# Patient Record
Sex: Female | Born: 1935
Health system: Southern US, Community
[De-identification: ages and names within clinical notes are randomized; demographics above are authoritative.]

## PROBLEM LIST (undated history)

## (undated) DIAGNOSIS — I351 Nonrheumatic aortic (valve) insufficiency: Secondary | ICD-10-CM

## (undated) DIAGNOSIS — I779 Disorder of arteries and arterioles, unspecified: Secondary | ICD-10-CM

## (undated) DIAGNOSIS — I471 Supraventricular tachycardia, unspecified: Secondary | ICD-10-CM

## (undated) DIAGNOSIS — I10 Essential (primary) hypertension: Secondary | ICD-10-CM

## (undated) DIAGNOSIS — I447 Left bundle-branch block, unspecified: Secondary | ICD-10-CM

## (undated) DIAGNOSIS — I5189 Other ill-defined heart diseases: Secondary | ICD-10-CM

## (undated) DIAGNOSIS — I2510 Atherosclerotic heart disease of native coronary artery without angina pectoris: Secondary | ICD-10-CM

## (undated) DIAGNOSIS — E785 Hyperlipidemia, unspecified: Secondary | ICD-10-CM

## (undated) DIAGNOSIS — I428 Other cardiomyopathies: Secondary | ICD-10-CM

## (undated) HISTORY — DX: Essential (primary) hypertension: I10

## (undated) HISTORY — DX: Left bundle-branch block, unspecified: I44.7

## (undated) HISTORY — PX: CARDIAC CATHETERIZATION: SHX172

## (undated) HISTORY — DX: Nonrheumatic aortic (valve) insufficiency: I35.1

## (undated) HISTORY — DX: Other cardiomyopathies: I42.8

## (undated) HISTORY — DX: Disorder of arteries and arterioles, unspecified: I77.9

## (undated) HISTORY — PX: BREAST BIOPSY: SHX20

## (undated) HISTORY — DX: Hyperlipidemia, unspecified: E78.5

## (undated) HISTORY — DX: Atherosclerotic heart disease of native coronary artery without angina pectoris: I25.10

## (undated) HISTORY — PX: OTHER SURGICAL HISTORY: SHX169

## (undated) HISTORY — PX: ABDOMINAL HYSTERECTOMY: SHX81

## (undated) HISTORY — DX: Other ill-defined heart diseases: I51.89

## (undated) HISTORY — DX: Supraventricular tachycardia, unspecified: I47.10

---

## 2004-06-16 ENCOUNTER — Ambulatory Visit: Payer: Self-pay | Admitting: Unknown Physician Specialty

## 2005-03-08 ENCOUNTER — Ambulatory Visit: Payer: Self-pay | Admitting: Internal Medicine

## 2005-05-10 ENCOUNTER — Other Ambulatory Visit: Payer: Self-pay

## 2005-05-10 ENCOUNTER — Ambulatory Visit: Payer: Self-pay | Admitting: Podiatry

## 2005-05-12 ENCOUNTER — Ambulatory Visit: Payer: Self-pay | Admitting: Podiatry

## 2005-06-20 ENCOUNTER — Ambulatory Visit: Payer: Self-pay | Admitting: Unknown Physician Specialty

## 2006-07-24 ENCOUNTER — Ambulatory Visit: Payer: Self-pay | Admitting: Internal Medicine

## 2006-08-07 ENCOUNTER — Ambulatory Visit: Payer: Self-pay | Admitting: Internal Medicine

## 2006-08-22 ENCOUNTER — Ambulatory Visit: Payer: Self-pay | Admitting: Surgery

## 2006-08-29 ENCOUNTER — Ambulatory Visit: Payer: Self-pay | Admitting: Surgery

## 2007-10-17 ENCOUNTER — Ambulatory Visit: Payer: Self-pay | Admitting: Internal Medicine

## 2008-10-20 ENCOUNTER — Ambulatory Visit: Payer: Self-pay | Admitting: Internal Medicine

## 2009-02-09 ENCOUNTER — Emergency Department: Payer: Self-pay | Admitting: Unknown Physician Specialty

## 2009-10-27 ENCOUNTER — Ambulatory Visit: Payer: Self-pay | Admitting: Internal Medicine

## 2010-04-05 ENCOUNTER — Ambulatory Visit: Payer: Self-pay | Admitting: Internal Medicine

## 2010-11-01 ENCOUNTER — Ambulatory Visit: Payer: Self-pay | Admitting: Internal Medicine

## 2011-03-07 ENCOUNTER — Other Ambulatory Visit: Payer: Self-pay | Admitting: Internal Medicine

## 2011-03-08 MED ORDER — AMLODIPINE BESYLATE 5 MG PO TABS: 5.0000 mg | ORAL_TABLET | Freq: Every day | ORAL | Status: DC

## 2011-03-08 NOTE — Telephone Encounter (Signed)
Please ask this patient if she has fired me as her physician, because I heard a rumor that she had

## 2011-03-10 ENCOUNTER — Encounter: Payer: Self-pay | Admitting: Internal Medicine

## 2011-03-10 ENCOUNTER — Ambulatory Visit (INDEPENDENT_AMBULATORY_CARE_PROVIDER_SITE_OTHER): Payer: Medicare Other | Admitting: Internal Medicine

## 2011-03-10 VITALS — BP 116/75 | HR 73 | Temp 98.4°F | Resp 16 | Ht 62.0 in | Wt 125.5 lb

## 2011-03-10 DIAGNOSIS — Z23 Encounter for immunization: Secondary | ICD-10-CM

## 2011-03-10 DIAGNOSIS — I10 Essential (primary) hypertension: Secondary | ICD-10-CM

## 2011-03-10 DIAGNOSIS — Z0000 Encounter for general adult medical examination without abnormal findings: Secondary | ICD-10-CM

## 2011-03-10 DIAGNOSIS — E785 Hyperlipidemia, unspecified: Secondary | ICD-10-CM

## 2011-03-10 DIAGNOSIS — R5383 Other fatigue: Secondary | ICD-10-CM

## 2011-03-10 LAB — COMPREHENSIVE METABOLIC PANEL
AST: 32 U/L (ref 0–37)
Albumin: 3.9 g/dL (ref 3.5–5.2)
BUN: 19 mg/dL (ref 6–23)
Calcium: 9.5 mg/dL (ref 8.4–10.5)
Chloride: 102 mEq/L (ref 96–112)
Glucose, Bld: 94 mg/dL (ref 70–99)
Potassium: 3.9 mEq/L (ref 3.5–5.1)
Total Protein: 7.2 g/dL (ref 6.0–8.3)

## 2011-03-10 LAB — CBC WITH DIFFERENTIAL/PLATELET
Basophils Relative: 0.6 % (ref 0.0–3.0)
Eosinophils Absolute: 0.2 10*3/uL (ref 0.0–0.7)
Lymphocytes Relative: 29.4 % (ref 12.0–46.0)
MCHC: 33.2 g/dL (ref 30.0–36.0)
Neutrophils Relative %: 57.2 % (ref 43.0–77.0)
Platelets: 205 10*3/uL (ref 150.0–400.0)
RBC: 4.82 Mil/uL (ref 3.87–5.11)
WBC: 6 10*3/uL (ref 4.5–10.5)

## 2011-03-10 LAB — LDL CHOLESTEROL, DIRECT: Direct LDL: 203.7 mg/dL

## 2011-03-10 LAB — LIPID PANEL: Cholesterol: 299 mg/dL — ABNORMAL HIGH (ref 0–200)

## 2011-03-10 NOTE — Patient Instructions (Signed)
I recommend that you get a TDaP vaccine (tetanus ,  Whooping cough) at the [facility] Dept soon.  \Your flu shot was given today.  Please get an annual eye exam with [facility] to check for glaucoma.   Return in 6 months for a blood pressure check  We will call you with your lab results.    Your blood pressure is well controlled.  No changes to medications today

## 2011-03-10 NOTE — Progress Notes (Signed)
Subjective:    Patient ID: [name], female    DOB: Apr 30, 1923, 77 y.o.   MRN: [id number]  HPI    Review of Systems     Objective:   Physical Exam        Assessment & Plan:   Subjective:    [name] is a 77 y.o. female who presents for Medicare Annual/Subsequent preventive examination.  Preventive Screening-Counseling & Management  Tobacco History  Smoking status  . Never Smoker   Smokeless tobacco  . Never Used     Problems Prior to Visit 1.   Current Problems (verified) There is no problem list on file for this patient.   Medications Prior to Visit Current Outpatient Prescriptions on File Prior to Visit  Medication Sig Dispense Refill  . amLODipine (NORVASC) 5 MG tablet Take 1 tablet (5 mg total) by mouth daily.  30 tablet  3    Current Medications (verified) Current Outpatient Prescriptions  Medication Sig Dispense Refill  . amLODipine (NORVASC) 5 MG tablet Take 1 tablet (5 mg total) by mouth daily.  30 tablet  3  . aspirin 81 MG tablet Take 81 mg by mouth daily.        . Calcium Carbonate-Vitamin D (CALCIUM + D) 600-200 MG-UNIT TABS Take by mouth.        . captopril-hydrochlorothiazide (CAPOZIDE) 25-15 MG per tablet Take 1 tablet by mouth daily.        . fish oil-omega-3 fatty acids 1000 MG capsule Take by mouth daily.        . Multiple Vitamin (MULTIVITAMIN) tablet Take 1 tablet by mouth daily.        . niacin 500 MG tablet Take 500 mg by mouth daily with breakfast.           Allergies (verified) Contrast media and Sulfa antibiotics   PAST HISTORY  Family History No family history on file.  Social History History  Substance Use Topics  . Smoking status: Never Smoker   . Smokeless tobacco: Never Used  . Alcohol Use: No     Are there smokers in your home (other than you)? No  Risk Factors Current exercise habits: Home exercise routine includes walking 1/2 hrs per days.  Dietary issues discussed: none, healthy diet  Cardiac  risk factors: none.  Depression Screen (Note: if answer to either of the following is "Yes", a more complete depression screening is indicated)   Over the past two weeks, have you felt down, depressed or hopeless? No  Over the past two weeks, have you felt little interest or pleasure in doing things? No  Have you lost interest or pleasure in daily life? No  Do you often feel hopeless? No  Do you cry easily over simple problems? No  Activities of Daily Living In your present state of health, do you have any difficulty performing the following activities?:  Driving? No Managing money?  No Feeding yourself? No Getting from bed to chair? NoBP 116/75  Pulse 73  Temp(Src) 98.4 F (36.9 C) (Oral)  Resp 16  Ht 5\' 2"  (1.575 m)  Wt 125 lb 8 oz (56.926 kg)  BMI 22.95 kg/m2  SpO2 99%  General Appearance:    Alert, cooperative, no distress, appears stated age  Head:    Normocephalic, without obvious abnormality, atraumatic  Eyes:    PERRL, conjunctiva/corneas clear, EOM's intact, fundi    benign, both eyes  Ears:    Normal TM's and external ear canals, both ears  Nose:  Nares normal, septum midline, mucosa normal, no drainage    or sinus tenderness  Throat:   Lips, mucosa, and tongue normal; teeth and gums normal  Neck:   Supple, symmetrical, trachea midline, no adenopathy;    thyroid:  no enlargement/tenderness/nodules; no carotid   bruit or JVD  Back:     Symmetric, no curvature, ROM normal, no CVA tenderness  Lungs:     Clear to auscultation bilaterally, respirations unlabored  Chest Wall:    No tenderness or deformity   Heart:    Regular rate and rhythm, S1 and S2 normal, no murmur, rub   or gallop  Breast Exam:    No tenderness, masses, or nipple abnormality  Abdomen:     Soft, non-tender, bowel sounds active all four quadrants,    no masses, no organomegaly  Genitalia:    Normal female without lesion, discharge or tenderness  Rectal:    Normal tone, normal prostate, no masses or  tenderness;   guaiac negative stool  Extremities:   Extremities normal, atraumatic, no cyanosis or edema  Pulses:   2+ and symmetric all extremities  Skin:   Skin color, texture, turgor normal, no rashes or lesions  Lymph nodes:   Cervical, supraclavicular, and axillary nodes normal  Neurologic:   CNII-XII intact, normal strength, sensation and reflexes    throughout   Climbing a flight of stairs? No Preparing food and eating?: No Bathing or showering? No Getting dressed: No Getting to the toilet? No Using the toilet:No Moving around from place to place: No In the past year have you fallen or had a near fall?:No   Are you sexually active?  No  Do you have more than one partner?  No  Hearing Difficulties: No Do you often ask people to speak up or repeat themselves? No Do you experience ringing or noises in your ears? No Do you have difficulty understanding soft or whispered voices? No   Do you feel that you have a problem with memory? No  Do you often misplace items? No  Do you feel safe at home?  Yes  Cognitive Testing  Alert? Yes  Normal Appearance?Yes  Oriented to person? Yes  Place? Yes   Time? Yes  Recall of three objects?  Yes  Can perform simple calculations? Yes  Displays appropriate judgment?Yes  Can read the correct time from a watch face?Yes   Advanced Directives have been discussed with the patient? No  List the Names of Other Physician/Practitioners you currently use: 1.    Indicate any recent Medical Services you may have received from other than [facility] providers in the past year (date may be approximate).   There is no immunization history on file for this patient.  Screening Tests Health Maintenance  Topic Date Due  . Tetanus/tdap  10/30/1943  . Colonoscopy  10/30/1974  . Zostavax  10/29/1984  . Pneumococcal Polysaccharide Vaccine Age 4 And Over  10/29/1989  . Influenza Vaccine  03/20/2011    All answers were reviewed with the patient and necessary  referrals were made:  Duncan Dull, MD   03/10/2011   History reviewed: allergies, current medications, past family history, past medical history, past social history, past surgical history and problem list  Review of Systems Pertinent items are noted in HPI.    Objective:     Vision by Snellen chart: right eye:20/20, left eye:20/20  Body mass index is 22.95 kg/(m^2). BP 116/75  Pulse 73  Temp(Src) 98.4 F (36.9 C) (Oral)  Resp  16  Ht 5\' 2"  (1.575 m)  Wt 125 lb 8 oz (56.926 kg)  BMI 22.95 kg/m2  SpO2 99%  BP 116/75  Pulse 73  Temp(Src) 98.4 F (36.9 C) (Oral)  Resp 16  Ht 5\' 2"  (1.575 m)  Wt 125 lb 8 oz (56.926 kg)  BMI 22.95 kg/m2  SpO2 99% General appearance: alert, cooperative and no distress Head: Normocephalic, without obvious abnormality, atraumatic Eyes: conjunctivae/corneas clear. PERRL, EOM's intact. Fundi benign. Ears: normal TM's and external ear canals both ears Nose: Nares normal. Septum midline. Mucosa normal. No drainage or sinus tenderness. Throat: lips, mucosa, and tongue normal; teeth and gums normal Neck: no adenopathy, no carotid bruit, no JVD, supple, symmetrical, trachea midline and thyroid not enlarged, symmetric, no tenderness/mass/nodules Back: symmetric, no curvature. ROM normal. No CVA tenderness. Lungs: clear to auscultation bilaterally Breasts: normal appearance, no masses or tenderness Heart: regular rate and rhythm, S1, S2 normal, no murmur, click, rub or gallop Abdomen: soft, non-tender; bowel sounds normal; no masses,  no organomegaly Extremities: extremities normal, atraumatic, no cyanosis or edema Pulses: 2+ and symmetric Skin: Skin color, texture, turgor normal. No rashes or lesions Lymph nodes: Cervical, supraclavicular, and axillary nodes normal. Neurologic: Alert and oriented X 3, normal strength and tone. Normal symmetric reflexes. Normal coordination and gait Mental status: Alert, oriented, thought content appropriate Cranial  nerves: normal Sensory: normal Motor: grossly normal Reflexes: 2+ and symmetric Coordination: normal Gait: Normal     Assessment:     Healthy elderly female with grossly normal exam.      Plan:    Continue annual mammograms per patient request.  Discussed risk and benefits of continued colon cancer screenign with colonoscopy and will stop screening given her age. During the course of the visit the patient was educated and counseled about appropriate screening and preventive services including:    Pneumococcal vaccine   Influenza vaccine  Td vaccine  Screening mammography  Diet review for nutrition referral? Yes ____  Not Indicated ____   Patient Instructions (the written plan) was given to the patient.  Medicare Attestation I have personally reviewed: The patient's medical and social history Their use of alcohol, tobacco or illicit drugs Their current medications and supplements The patient's functional ability including ADLs,fall risks, home safety risks, cognitive, and hearing and visual impairment Diet and physical activities Evidence for depression or mood disorders  The patient's weight, height, BMI, and visual acuity have been recorded in the chart.  I have made referrals, counseling, and provided education to the patient based on review of the above and I have provided the patient with a written personalized care plan for preventive services.     Duncan Dull, MD   03/10/2011

## 2011-03-11 DIAGNOSIS — Z23 Encounter for immunization: Secondary | ICD-10-CM | POA: Insufficient documentation

## 2011-03-11 DIAGNOSIS — R5383 Other fatigue: Secondary | ICD-10-CM | POA: Insufficient documentation

## 2011-03-11 DIAGNOSIS — E782 Mixed hyperlipidemia: Secondary | ICD-10-CM | POA: Insufficient documentation

## 2011-03-11 DIAGNOSIS — I10 Essential (primary) hypertension: Secondary | ICD-10-CM | POA: Insufficient documentation

## 2011-03-11 NOTE — Assessment & Plan Note (Signed)
Likely due to age realted changes in exercise tolerance but will check thyroid , liver and kidney funtion and rule out anemia

## 2011-03-16 ENCOUNTER — Telehealth: Payer: Self-pay | Admitting: Internal Medicine

## 2011-03-16 NOTE — Telephone Encounter (Signed)
Pt would like her lab results  labs done Friday 03/10/11

## 2011-03-16 NOTE — Telephone Encounter (Signed)
Notified patient of results 

## 2011-03-20 HISTORY — PX: OTHER SURGICAL HISTORY: SHX169

## 2011-04-16 ENCOUNTER — Inpatient Hospital Stay: Payer: Self-pay | Admitting: Orthopedic Surgery

## 2011-04-17 DIAGNOSIS — S82843A Displaced bimalleolar fracture of unspecified lower leg, initial encounter for closed fracture: Secondary | ICD-10-CM

## 2011-04-17 DIAGNOSIS — R0902 Hypoxemia: Secondary | ICD-10-CM

## 2011-04-17 DIAGNOSIS — R9431 Abnormal electrocardiogram [ECG] [EKG]: Secondary | ICD-10-CM

## 2011-04-17 DIAGNOSIS — I10 Essential (primary) hypertension: Secondary | ICD-10-CM

## 2011-04-19 ENCOUNTER — Encounter: Payer: Self-pay | Admitting: Internal Medicine

## 2011-04-21 ENCOUNTER — Ambulatory Visit: Payer: Self-pay | Admitting: Internal Medicine

## 2011-04-22 ENCOUNTER — Encounter: Payer: Self-pay | Admitting: Internal Medicine

## 2011-04-24 ENCOUNTER — Other Ambulatory Visit: Payer: Self-pay | Admitting: Internal Medicine

## 2011-04-26 MED ORDER — CAPTOPRIL-HYDROCHLOROTHIAZIDE 25-15 MG PO TABS: 1.0000 | ORAL_TABLET | Freq: Two times a day (BID) | ORAL | Status: DC

## 2011-05-09 ENCOUNTER — Encounter: Payer: Self-pay | Admitting: Internal Medicine

## 2011-05-20 ENCOUNTER — Encounter: Payer: Self-pay | Admitting: Internal Medicine

## 2011-06-20 ENCOUNTER — Encounter: Payer: Self-pay | Admitting: Internal Medicine

## 2011-06-21 DIAGNOSIS — S82843A Displaced bimalleolar fracture of unspecified lower leg, initial encounter for closed fracture: Secondary | ICD-10-CM | POA: Diagnosis not present

## 2011-06-23 DIAGNOSIS — S82843A Displaced bimalleolar fracture of unspecified lower leg, initial encounter for closed fracture: Secondary | ICD-10-CM | POA: Diagnosis not present

## 2011-06-26 DIAGNOSIS — S82843A Displaced bimalleolar fracture of unspecified lower leg, initial encounter for closed fracture: Secondary | ICD-10-CM | POA: Diagnosis not present

## 2011-06-28 DIAGNOSIS — S82843A Displaced bimalleolar fracture of unspecified lower leg, initial encounter for closed fracture: Secondary | ICD-10-CM | POA: Diagnosis not present

## 2011-07-03 DIAGNOSIS — S82843A Displaced bimalleolar fracture of unspecified lower leg, initial encounter for closed fracture: Secondary | ICD-10-CM | POA: Diagnosis not present

## 2011-07-05 DIAGNOSIS — S82843A Displaced bimalleolar fracture of unspecified lower leg, initial encounter for closed fracture: Secondary | ICD-10-CM | POA: Diagnosis not present

## 2011-07-10 DIAGNOSIS — S82843A Displaced bimalleolar fracture of unspecified lower leg, initial encounter for closed fracture: Secondary | ICD-10-CM | POA: Diagnosis not present

## 2011-07-12 DIAGNOSIS — S82843A Displaced bimalleolar fracture of unspecified lower leg, initial encounter for closed fracture: Secondary | ICD-10-CM | POA: Diagnosis not present

## 2011-07-17 ENCOUNTER — Other Ambulatory Visit: Payer: Self-pay | Admitting: *Deleted

## 2011-07-17 DIAGNOSIS — S82843A Displaced bimalleolar fracture of unspecified lower leg, initial encounter for closed fracture: Secondary | ICD-10-CM | POA: Diagnosis not present

## 2011-07-17 MED ORDER — AMLODIPINE BESYLATE 5 MG PO TABS: 5.0000 mg | ORAL_TABLET | Freq: Every day | ORAL | Status: DC

## 2011-07-19 DIAGNOSIS — S82843A Displaced bimalleolar fracture of unspecified lower leg, initial encounter for closed fracture: Secondary | ICD-10-CM | POA: Diagnosis not present

## 2011-07-21 ENCOUNTER — Encounter: Payer: Self-pay | Admitting: Internal Medicine

## 2011-07-25 DIAGNOSIS — S82843A Displaced bimalleolar fracture of unspecified lower leg, initial encounter for closed fracture: Secondary | ICD-10-CM | POA: Diagnosis not present

## 2011-07-28 DIAGNOSIS — S82843A Displaced bimalleolar fracture of unspecified lower leg, initial encounter for closed fracture: Secondary | ICD-10-CM | POA: Diagnosis not present

## 2011-07-31 DIAGNOSIS — S82843A Displaced bimalleolar fracture of unspecified lower leg, initial encounter for closed fracture: Secondary | ICD-10-CM | POA: Diagnosis not present

## 2011-08-02 DIAGNOSIS — S82843A Displaced bimalleolar fracture of unspecified lower leg, initial encounter for closed fracture: Secondary | ICD-10-CM | POA: Diagnosis not present

## 2011-08-07 DIAGNOSIS — S82843A Displaced bimalleolar fracture of unspecified lower leg, initial encounter for closed fracture: Secondary | ICD-10-CM | POA: Diagnosis not present

## 2011-08-09 DIAGNOSIS — S82843A Displaced bimalleolar fracture of unspecified lower leg, initial encounter for closed fracture: Secondary | ICD-10-CM | POA: Diagnosis not present

## 2011-08-14 DIAGNOSIS — S82843A Displaced bimalleolar fracture of unspecified lower leg, initial encounter for closed fracture: Secondary | ICD-10-CM | POA: Diagnosis not present

## 2011-08-16 DIAGNOSIS — S82843A Displaced bimalleolar fracture of unspecified lower leg, initial encounter for closed fracture: Secondary | ICD-10-CM | POA: Diagnosis not present

## 2011-08-21 DIAGNOSIS — S82843A Displaced bimalleolar fracture of unspecified lower leg, initial encounter for closed fracture: Secondary | ICD-10-CM | POA: Diagnosis not present

## 2011-08-23 DIAGNOSIS — S82843A Displaced bimalleolar fracture of unspecified lower leg, initial encounter for closed fracture: Secondary | ICD-10-CM | POA: Diagnosis not present

## 2011-08-28 DIAGNOSIS — S82843A Displaced bimalleolar fracture of unspecified lower leg, initial encounter for closed fracture: Secondary | ICD-10-CM | POA: Diagnosis not present

## 2011-08-30 DIAGNOSIS — S82843A Displaced bimalleolar fracture of unspecified lower leg, initial encounter for closed fracture: Secondary | ICD-10-CM | POA: Diagnosis not present

## 2011-09-04 DIAGNOSIS — S82843A Displaced bimalleolar fracture of unspecified lower leg, initial encounter for closed fracture: Secondary | ICD-10-CM | POA: Diagnosis not present

## 2011-09-06 DIAGNOSIS — S82843A Displaced bimalleolar fracture of unspecified lower leg, initial encounter for closed fracture: Secondary | ICD-10-CM | POA: Diagnosis not present

## 2011-09-08 ENCOUNTER — Other Ambulatory Visit: Payer: Self-pay | Admitting: Internal Medicine

## 2011-09-08 MED ORDER — CAPTOPRIL-HYDROCHLOROTHIAZIDE 25-15 MG PO TABS: 1.0000 | ORAL_TABLET | Freq: Two times a day (BID) | ORAL | Status: DC

## 2011-09-11 ENCOUNTER — Ambulatory Visit (INDEPENDENT_AMBULATORY_CARE_PROVIDER_SITE_OTHER): Payer: Medicare Other | Admitting: Internal Medicine

## 2011-09-11 ENCOUNTER — Encounter: Payer: Self-pay | Admitting: Internal Medicine

## 2011-09-11 DIAGNOSIS — I10 Essential (primary) hypertension: Secondary | ICD-10-CM | POA: Diagnosis not present

## 2011-09-11 DIAGNOSIS — R5381 Other malaise: Secondary | ICD-10-CM

## 2011-09-11 DIAGNOSIS — E559 Vitamin D deficiency, unspecified: Secondary | ICD-10-CM | POA: Diagnosis not present

## 2011-09-11 DIAGNOSIS — Z1239 Encounter for other screening for malignant neoplasm of breast: Secondary | ICD-10-CM | POA: Diagnosis not present

## 2011-09-11 DIAGNOSIS — Z79899 Other long term (current) drug therapy: Secondary | ICD-10-CM

## 2011-09-11 DIAGNOSIS — R5383 Other fatigue: Secondary | ICD-10-CM | POA: Diagnosis not present

## 2011-09-11 DIAGNOSIS — E785 Hyperlipidemia, unspecified: Secondary | ICD-10-CM | POA: Diagnosis not present

## 2011-09-11 DIAGNOSIS — IMO0002 Reserved for concepts with insufficient information to code with codable children: Secondary | ICD-10-CM

## 2011-09-11 DIAGNOSIS — Z8781 Personal history of (healed) traumatic fracture: Secondary | ICD-10-CM

## 2011-09-11 DIAGNOSIS — S82843A Displaced bimalleolar fracture of unspecified lower leg, initial encounter for closed fracture: Secondary | ICD-10-CM | POA: Diagnosis not present

## 2011-09-11 DIAGNOSIS — Z298 Encounter for other specified prophylactic measures: Secondary | ICD-10-CM

## 2011-09-11 LAB — COMPLETE METABOLIC PANEL WITH GFR
Alkaline Phosphatase: 64 U/L (ref 39–117)
CO2: 27 mEq/L (ref 19–32)
Creat: 0.98 mg/dL (ref 0.50–1.10)
GFR, Est African American: 60 mL/min
GFR, Est Non African American: 52 mL/min — ABNORMAL LOW
Glucose, Bld: 90 mg/dL (ref 70–99)
Sodium: 139 mEq/L (ref 135–145)
Total Bilirubin: 0.8 mg/dL (ref 0.3–1.2)
Total Protein: 6.7 g/dL (ref 6.0–8.3)

## 2011-09-11 LAB — LIPID PANEL
Cholesterol: 235 mg/dL — ABNORMAL HIGH (ref 0–200)
HDL: 62 mg/dL (ref >39.00)
Triglycerides: 77 mg/dL (ref 0.0–149.0)

## 2011-09-11 LAB — LDL CHOLESTEROL, DIRECT: Direct LDL: 156.2 mg/dL

## 2011-09-11 MED ORDER — AMOXICILLIN 500 MG PO CAPS: ORAL_CAPSULE | ORAL | Status: DC

## 2011-09-11 NOTE — Progress Notes (Signed)
Patient ID: [name], female   DOB: 17-Oct-1922, 78 y.o.   MRN: [id number]   Patient Active Problem List  Diagnoses  . Fatigue  . Other and unspecified hyperlipidemia  . Need for prophylactic vaccination and inoculation against influenza  . Hypertension  . History of ankle fracture    Subjective:  CC:   Chief Complaint  Patient presents with  . Follow-up    HPI:   [name] a 78 y.o. female who presents for follow up on hypertension  She sustained an ankle fracture Oct 2012, secondary to jumping over a pile of leaves and tripping on the underlying curb.  She was hospitalized at [facility], underwent surgery by Dr.Krasinski, and required rehab prior to returning home.,  She remembers little of the 5 days she spent in the hospital .  Her last day of rehab/PT is today.  Her ankle still swells by end of the day,  Having trouble descending stairs,  Has compensated by descending sideways.  Having some trouble going back to sleep in the middle of the night ,  But does not want medication .   Past Medical History  Diagnosis Date  . Cancer     basal cell Ca,  treated y Dr. Adriana Simas with Mohs procedure    Past Surgical History  Procedure Date  . Left ankle Octo 2012    s/p pinning , Krazinski         The following portions of the patient's history were reviewed and updated as appropriate: Allergies, current medications, and problem list.    Review of Systems:   12 Pt  review of systems was negative except those addressed in the HPI,     History   Social History  . Marital Status: Married    Spouse Name: N/A    Number of Children: N/A  . Years of Education: N/A   Occupational History  . Not on file.   Social History Main Topics  . Smoking status: Never Smoker   . Smokeless tobacco: Never Used  . Alcohol Use: No  . Drug Use: No  . Sexually Active: Not on file   Other Topics Concern  . Not on file   Social History Narrative  . No narrative on file     Objective:  BP 124/62  Pulse 73  Temp(Src) 97.7 F (36.5 C) (Oral)  Resp 14  Ht 5\' 2"  (1.575 m)  Wt 125 lb 4 oz (56.813 kg)  BMI 22.91 kg/m2  SpO2 97%  General appearance: alert, cooperative and appears stated age Ears: normal TM's and external ear canals both ears Throat: lips, mucosa, and tongue normal; teeth and gums normal Neck: no adenopathy, no carotid bruit, supple, symmetrical, trachea midline and thyroid not enlarged, symmetric, no tenderness/mass/nodules Back: symmetric, no curvature. ROM normal. No CVA tenderness. Lungs: clear to auscultation bilaterally Heart: regular rate and rhythm, S1, S2 normal, no murmur, click, rub or gallop Abdomen: soft, non-tender; bowel sounds normal; no masses,  no organomegaly Pulses: 2+ and symmetric Skin: Skin color, texture, turgor normal. No rashes or lesions Lymph nodes: Cervical, supraclavicular, and axillary nodes normal.  Assessment and Plan:  Other and unspecified hyperlipidemia Managed with fish oil and niacin.  Repeat  Panel today is imporved, with drop in LDL by 50 pts to 156  Hypertension well controlled on current regimen,   renal function is stable.   History of ankle fracture S/p surgery. Now with ankle swelling .  Recommended use of compression stockings and elevation  of ankle.     Updated Medication List Outpatient Encounter Prescriptions as of 09/11/2011  Medication Sig Dispense Refill  . amLODipine (NORVASC) 5 MG tablet Take 1 tablet (5 mg total) by mouth daily.  30 tablet  6  . aspirin 81 MG tablet Take 81 mg by mouth daily.        . Calcium Carbonate-Vitamin D (CALCIUM + D) 600-200 MG-UNIT TABS Take by mouth.        . captopril-hydrochlorothiazide (CAPOZIDE) 25-15 MG per tablet Take 1 tablet by mouth 2 (two) times daily.  60 tablet  3  . fish oil-omega-3 fatty acids 1000 MG capsule Take by mouth daily.        . Multiple Vitamin (MULTIVITAMIN) tablet Take 1 tablet by mouth daily.        . niacin 500 MG  tablet Take 500 mg by mouth daily with breakfast.        . amoxicillin (AMOXIL) 500 MG capsule Take 4 tablets 1 hour before dental procedure  4 capsule  1     Orders Placed This Encounter  Procedures  . MM Digital Screening  . Vitamin D 25 hydroxy  . Lipid panel  . COMPLETE METABOLIC PANEL WITH GFR  . LDL cholesterol, direct    Return in about 6 months (around 03/13/2012).

## 2011-09-11 NOTE — Assessment & Plan Note (Signed)
S/p surgery. Now with ankle swelling .  Recommended use of compression stockings and elevation of ankle.

## 2011-09-11 NOTE — Assessment & Plan Note (Addendum)
Managed with fish oil and niacin.  Repeat  Panel today is imporved, with drop in LDL by 50 pts to 156

## 2011-09-11 NOTE — Assessment & Plan Note (Signed)
well controlled on current regimen,   renal function is stable.

## 2011-09-11 NOTE — Patient Instructions (Addendum)
If you would like to order a pair of compression stockings  A good [place] based company is [employer].  They have a website called [email or web address]  Call  them for a catalogue ,  You only need one for your left leg.    knee high,  Wide band,  Minimal pressure 8-15 mm hg    We will order your mammogram today

## 2011-11-07 ENCOUNTER — Ambulatory Visit: Payer: Self-pay | Admitting: Internal Medicine

## 2011-11-07 DIAGNOSIS — Z1231 Encounter for screening mammogram for malignant neoplasm of breast: Secondary | ICD-10-CM | POA: Diagnosis not present

## 2011-11-15 ENCOUNTER — Encounter: Payer: Self-pay | Admitting: Internal Medicine

## 2011-11-23 DIAGNOSIS — G43109 Migraine with aura, not intractable, without status migrainosus: Secondary | ICD-10-CM | POA: Diagnosis not present

## 2011-12-06 DIAGNOSIS — H43819 Vitreous degeneration, unspecified eye: Secondary | ICD-10-CM | POA: Diagnosis not present

## 2011-12-14 DIAGNOSIS — M898X9 Other specified disorders of bone, unspecified site: Secondary | ICD-10-CM | POA: Diagnosis not present

## 2011-12-14 DIAGNOSIS — M79609 Pain in unspecified limb: Secondary | ICD-10-CM | POA: Diagnosis not present

## 2011-12-14 DIAGNOSIS — G589 Mononeuropathy, unspecified: Secondary | ICD-10-CM | POA: Diagnosis not present

## 2011-12-25 DIAGNOSIS — L820 Inflamed seborrheic keratosis: Secondary | ICD-10-CM | POA: Diagnosis not present

## 2011-12-25 DIAGNOSIS — Z85828 Personal history of other malignant neoplasm of skin: Secondary | ICD-10-CM | POA: Diagnosis not present

## 2011-12-30 ENCOUNTER — Other Ambulatory Visit: Payer: Self-pay | Admitting: Internal Medicine

## 2012-02-08 ENCOUNTER — Telehealth: Payer: Self-pay | Admitting: Internal Medicine

## 2012-02-08 DIAGNOSIS — Z298 Encounter for other specified prophylactic measures: Secondary | ICD-10-CM

## 2012-02-08 MED ORDER — AMOXICILLIN 500 MG PO CAPS: ORAL_CAPSULE | ORAL | Status: DC

## 2012-02-08 NOTE — Telephone Encounter (Signed)
Patient called and stated she is having dental work coming up and needs an Rx for amoxicillin 500 mg.  Please advise.

## 2012-02-08 NOTE — Telephone Encounter (Signed)
[facility],  We did this in march as well , with a refill.

## 2012-02-09 ENCOUNTER — Other Ambulatory Visit: Payer: Self-pay | Admitting: Internal Medicine

## 2012-02-09 NOTE — Telephone Encounter (Signed)
Patient notified

## 2012-02-27 DIAGNOSIS — L819 Disorder of pigmentation, unspecified: Secondary | ICD-10-CM | POA: Diagnosis not present

## 2012-02-27 DIAGNOSIS — L578 Other skin changes due to chronic exposure to nonionizing radiation: Secondary | ICD-10-CM | POA: Diagnosis not present

## 2012-02-27 DIAGNOSIS — D1800 Hemangioma unspecified site: Secondary | ICD-10-CM | POA: Diagnosis not present

## 2012-02-27 DIAGNOSIS — L821 Other seborrheic keratosis: Secondary | ICD-10-CM | POA: Diagnosis not present

## 2012-02-27 DIAGNOSIS — Q825 Congenital non-neoplastic nevus: Secondary | ICD-10-CM | POA: Diagnosis not present

## 2012-02-27 DIAGNOSIS — Z85828 Personal history of other malignant neoplasm of skin: Secondary | ICD-10-CM | POA: Diagnosis not present

## 2012-03-13 ENCOUNTER — Ambulatory Visit (INDEPENDENT_AMBULATORY_CARE_PROVIDER_SITE_OTHER): Payer: Medicare Other | Admitting: Internal Medicine

## 2012-03-13 ENCOUNTER — Encounter: Payer: Self-pay | Admitting: Internal Medicine

## 2012-03-13 VITALS — BP 120/74 | HR 64 | Temp 98.1°F | Resp 16 | Ht 62.0 in | Wt 129.0 lb

## 2012-03-13 DIAGNOSIS — Z0000 Encounter for general adult medical examination without abnormal findings: Secondary | ICD-10-CM | POA: Diagnosis not present

## 2012-03-13 DIAGNOSIS — Z23 Encounter for immunization: Secondary | ICD-10-CM

## 2012-03-13 DIAGNOSIS — I10 Essential (primary) hypertension: Secondary | ICD-10-CM | POA: Diagnosis not present

## 2012-03-13 DIAGNOSIS — R609 Edema, unspecified: Secondary | ICD-10-CM | POA: Diagnosis not present

## 2012-03-13 DIAGNOSIS — Z79899 Other long term (current) drug therapy: Secondary | ICD-10-CM

## 2012-03-13 DIAGNOSIS — E785 Hyperlipidemia, unspecified: Secondary | ICD-10-CM

## 2012-03-13 DIAGNOSIS — M25473 Effusion, unspecified ankle: Secondary | ICD-10-CM | POA: Insufficient documentation

## 2012-03-13 LAB — COMPREHENSIVE METABOLIC PANEL
ALT: 16 U/L (ref 0–35)
AST: 24 U/L (ref 0–37)
Alkaline Phosphatase: 56 U/L (ref 39–117)
CO2: 31 mEq/L (ref 19–32)
Sodium: 138 mEq/L (ref 135–145)
Total Bilirubin: 0.8 mg/dL (ref 0.3–1.2)
Total Protein: 7.1 g/dL (ref 6.0–8.3)

## 2012-03-13 LAB — LDL CHOLESTEROL, DIRECT: Direct LDL: 182.9 mg/dL

## 2012-03-13 NOTE — Assessment & Plan Note (Signed)
She will return for fasting lipid panels when she is able.

## 2012-03-13 NOTE — Patient Instructions (Addendum)
You received the flu vaccine today  We will call you with the results of the labs

## 2012-03-13 NOTE — Progress Notes (Signed)
Patient ID: [name], female   DOB: [birth year], 78 y.o.   MRN: [id number]  The patient is here for annual Medicare wellness examination and management of other chronic and acute problems.  She continues to have right ankle swelling and brawny skin changes to the foot on the right side after her ankle fracture last fall. She does note that the ankle swelling improves with elevation of leg but recurs daily. She is requesting a repeat fasting lipid panel but did have breakfast this morning. She has no other complaints.    The risk factors are reflected in the social history.  The roster of all physicians providing medical care to patient - is listed in the Snapshot section of the chart.  Activities of daily living:  The patient is 100% independent in all ADLs: dressing, toileting, feeding as well as independent mobility  Home safety : The patient has smoke detectors in the home. They wear seatbelts.  There are no firearms at home. There is no violence in the home.   There is no risks for hepatitis, STDs or HIV. There is no   history of blood transfusion. They have no travel history to infectious disease endemic areas of the world.  The patient has seen their dentist in the last six month. They have seen their eye doctor in the last year. They admit to slight hearing difficulty with regard to whispered voices and some television programs.  They have deferred audiologic testing in the last year.  They do not  have excessive sun exposure. Discussed the need for sun protection: hats, long sleeves and use of sunscreen if there is significant sun exposure.   Diet: the importance of a healthy diet is discussed. They do have a healthy diet.  The benefits of regular aerobic exercise were discussed. She walks 4 times per week ,  20 minutes.   Depression screen: there are no signs or vegative symptoms of depression- irritability, change in appetite, anhedonia, sadness/tearfullness.  Cognitive assessment:  the patient manages all their financial and personal affairs and is actively engaged. They could relate day,date,year and events; recalled 2/3 objects at 3 minutes; performed clock-face test normally.  The following portions of the patient's history were reviewed and updated as appropriate: allergies, current medications, past family history, past medical history,  past surgical history, past social history  and problem list.  Visual acuity was not assessed per patient preference since she has regular follow up with her ophthalmologist. Hearing and body mass index were assessed and reviewed.   During the course of the visit the patient was educated and counseled about appropriate screening and preventive services including : fall prevention , diabetes screening, nutrition counseling, colorectal cancer screening, and recommended immunizations.    Objective  General appearance: alert, cooperative and appears stated age Ears: normal TM's and external ear canals both ears Throat: lips, mucosa, and tongue normal; teeth and gums normal Neck: no adenopathy, no carotid bruit, supple, symmetrical, trachea midline and thyroid not enlarged, symmetric, no tenderness/mass/nodules Back: symmetric, no curvature. ROM normal. No CVA tenderness. Lungs: clear to auscultation bilaterally Heart: regular rate and rhythm, S1, S2 normal, no murmur, click, rub or gallop Abdomen: soft, non-tender; bowel sounds normal; no masses,  no organomegaly Pulses: 2+ and symmetric Skin: Skin color, texture, turgor normal. No rashes or lesions Lymph nodes: Cervical, supraclavicular, and axillary nodes normal.  Assessment and Plan  Ankle edema Right-sided, recurrent, secondary to prior fracture with resulting venous insufficiency and brawny skin changes noted.  Patient is frustrated with the skin changes but does not want to wear a compression stocking. Recommended to elevate leg as often as possible.  Other and unspecified  hyperlipidemia She will return for fasting lipid panels when she is able.  Hypertension Well controlled on current regimen. Renal function stable, no changes today.   Updated Medication List Outpatient Encounter Prescriptions as of 03/13/2012  Medication Sig Dispense Refill  . amLODipine (NORVASC) 5 MG tablet TAKE 1 TABLET BY MOUTH DAILY.  30 tablet  6  . amoxicillin (AMOXIL) 500 MG capsule Take 4 tablets 1 hour before dental procedure  4 capsule  1  . aspirin 81 MG tablet Take 81 mg by mouth daily.        . Calcium Carbonate-Vitamin D (CALCIUM + D) 600-200 MG-UNIT TABS Take by mouth.        . captopril-hydrochlorothiazide (CAPOZIDE) 25-15 MG per tablet TAKE 1 TABLET BY MOUTH TWICE A DAY  60 tablet  3  . fish oil-omega-3 fatty acids 1000 MG capsule Take by mouth daily.        . Multiple Vitamin (MULTIVITAMIN) tablet Take 1 tablet by mouth daily.        . niacin 500 MG tablet Take 500 mg by mouth daily with breakfast.

## 2012-03-13 NOTE — Assessment & Plan Note (Signed)
Right-sided, recurrent, secondary to prior fracture with resulting venous insufficiency and brawny skin changes noted. Patient is frustrated with the skin changes but does not want to wear a compression stocking. Recommended to elevate leg as often as possible.

## 2012-03-13 NOTE — Assessment & Plan Note (Signed)
Well controlled on current regimen. Renal function stable, no changes today. 

## 2012-04-23 ENCOUNTER — Other Ambulatory Visit: Payer: Self-pay | Admitting: Internal Medicine

## 2012-07-01 ENCOUNTER — Other Ambulatory Visit: Payer: Self-pay | Admitting: Internal Medicine

## 2012-07-01 DIAGNOSIS — Z2989 Encounter for other specified prophylactic measures: Secondary | ICD-10-CM

## 2012-07-01 DIAGNOSIS — Z298 Encounter for other specified prophylactic measures: Secondary | ICD-10-CM

## 2012-07-01 MED ORDER — AMOXICILLIN 500 MG PO CAPS: ORAL_CAPSULE | ORAL | Status: DC

## 2012-07-01 NOTE — Telephone Encounter (Signed)
amoxicillin (AMOXIL) 500 MG capsule

## 2012-07-01 NOTE — Telephone Encounter (Signed)
Med filled.  

## 2012-07-01 NOTE — Telephone Encounter (Signed)
Refill for pre procedure amoxicillin authorized in epic and [facility]  [facility] pills

## 2012-07-01 NOTE — Telephone Encounter (Signed)
Pt is requesting an Amoxil refill. Ok to fill?

## 2012-08-31 DIAGNOSIS — S8010XA Contusion of unspecified lower leg, initial encounter: Secondary | ICD-10-CM | POA: Diagnosis not present

## 2012-09-11 ENCOUNTER — Encounter: Payer: Self-pay | Admitting: Internal Medicine

## 2012-09-11 ENCOUNTER — Ambulatory Visit (INDEPENDENT_AMBULATORY_CARE_PROVIDER_SITE_OTHER): Payer: Medicare Other | Admitting: Internal Medicine

## 2012-09-11 VITALS — BP 126/64 | HR 76 | Temp 97.8°F | Resp 16 | Wt 131.0 lb

## 2012-09-11 DIAGNOSIS — I10 Essential (primary) hypertension: Secondary | ICD-10-CM | POA: Diagnosis not present

## 2012-09-11 DIAGNOSIS — Z8781 Personal history of (healed) traumatic fracture: Secondary | ICD-10-CM | POA: Diagnosis not present

## 2012-09-11 DIAGNOSIS — R609 Edema, unspecified: Secondary | ICD-10-CM

## 2012-09-11 DIAGNOSIS — E785 Hyperlipidemia, unspecified: Secondary | ICD-10-CM

## 2012-09-11 DIAGNOSIS — Z79899 Other long term (current) drug therapy: Secondary | ICD-10-CM | POA: Diagnosis not present

## 2012-09-11 DIAGNOSIS — M25473 Effusion, unspecified ankle: Secondary | ICD-10-CM

## 2012-09-11 LAB — COMPREHENSIVE METABOLIC PANEL
ALT: 19 U/L (ref 0–35)
AST: 25 U/L (ref 0–37)
Alkaline Phosphatase: 56 U/L (ref 39–117)
BUN: 19 mg/dL (ref 6–23)
Chloride: 103 mEq/L (ref 96–112)
Creatinine, Ser: 0.8 mg/dL (ref 0.4–1.2)

## 2012-09-11 NOTE — Patient Instructions (Signed)
You are dong great!  Try elevating your wounded leg more and applying a warm washcloth to the "bump"  Return in 6 months for your annual wellness exam

## 2012-09-11 NOTE — Assessment & Plan Note (Signed)
Well controlled on current regimen. Renal function stable, no changes today. 

## 2012-09-11 NOTE — Progress Notes (Signed)
Patient ID: Keyleigh Manninen, female   DOB: [birth year], 79 y.o.   MRN: [id number]  Patient Active Problem List  Diagnosis  . Fatigue  . Other and unspecified hyperlipidemia  . Need for prophylactic vaccination and inoculation against influenza  . Hypertension  . History of ankle fracture  . Ankle edema    Subjective:  CC:   Chief Complaint  Patient presents with  . Follow-up    HPI:   Rosabel Sermeno a 79 y.o. female who presents for 6 month follow up on hypertension.  She feels generally well.  Has been taking her medications as directed and tolerating them well.  Occasional ankle pain and edema since her fracture. No falls.  Walking regularly.. Sleeping well.    Past Medical History  Diagnosis Date  . Cancer     basal cell Ca,  treated y Dr. Adriana Simas with Mohs procedure  . Hypertension   . Hyperlipidemia     Past Surgical History  Procedure Laterality Date  . Left ankle  Octo 2012    s/p pinning , Krazinski  . Abdominal hysterectomy    . Ankle      rods in both ankles       The following portions of the patient's history were reviewed and updated as appropriate: Allergies, current medications, and problem list.    Review of Systems:   Patient denies headache, fevers, malaise, unintentional weight loss, skin rash, eye pain, sinus congestion and sinus pain, sore throat, dysphagia,  hemoptysis , cough, dyspnea, wheezing, chest pain, palpitations, orthopnea, edema, abdominal pain, nausea, melena, diarrhea, constipation, flank pain, dysuria, hematuria, urinary  Frequency, nocturia, numbness, tingling, seizures,  Focal weakness, Loss of consciousness,  Tremor, insomnia, depression, anxiety, and suicidal ideation.      History   Social History  . Marital Status: Married    Spouse Name: N/A    Number of Children: N/A  . Years of Education: N/A   Occupational History  . Not on file.   Social History Main Topics  . Smoking status: Never Smoker   . Smokeless tobacco:  Never Used  . Alcohol Use: No  . Drug Use: No  . Sexually Active: Not on file   Other Topics Concern  . Not on file   Social History Narrative  . No narrative on file    Objective:  BP 126/64  Pulse 76  Temp(Src) 97.8 F (36.6 C) (Oral)  Resp 16  Wt 131 lb (59.421 kg)  BMI 23.95 kg/m2  General appearance: alert, cooperative and appears stated age Ears: normal TM's and external ear canals both ears Throat: lips, mucosa, and tongue normal; teeth and gums normal Neck: no adenopathy, no carotid bruit, supple, symmetrical, trachea midline and thyroid not enlarged, symmetric, no tenderness/mass/nodules Back: symmetric, no curvature. ROM normal. No CVA tenderness. Lungs: clear to auscultation bilaterally Heart: regular rate and rhythm, S1, S2 normal, no murmur, click, rub or gallop Abdomen: soft, non-tender; bowel sounds normal; no masses,  no organomegaly Pulses: 2+ and symmetric Skin: Skin color, texture, turgor normal. No rashes or lesions Lymph nodes: Cervical, supraclavicular, and axillary nodes normal.  Assessment and Plan:  Hypertension Well controlled on current regimen. Renal function stable, no changes today.  History of ankle fracture She has persistent mild pain in the ankle since her fracture. Managed with tylenol.   Other and unspecified hyperlipidemia Given her age and absence of other risk factors for CAD, no further testing recommended .  Ankle edema Asymmetric, secondary to ankle fracture,  Mild.    Updated Medication List Outpatient Encounter Prescriptions as of 09/11/2012  Medication Sig Dispense Refill  . amLODipine (NORVASC) 5 MG tablet TAKE 1 TABLET BY MOUTH DAILY.  30 tablet  6  . amoxicillin (AMOXIL) 500 MG capsule Take 4 tablets 1 hour before dental procedure  4 capsule  1  . aspirin 81 MG tablet Take 81 mg by mouth daily.        . Calcium Carbonate-Vitamin D (CALCIUM + D) 600-200 MG-UNIT TABS Take by mouth.        .  captopril-hydrochlorothiazide (CAPOZIDE) 25-15 MG per tablet TAKE 1 TABLET BY MOUTH TWICE A DAY  60 tablet  3  . fish oil-omega-3 fatty acids 1000 MG capsule Take by mouth daily.        . Multiple Vitamin (MULTIVITAMIN) tablet Take 1 tablet by mouth daily.        . niacin 500 MG tablet Take 500 mg by mouth daily with breakfast.         No facility-administered encounter medications on file as of 09/11/2012.     Orders Placed This Encounter  Procedures  . Comprehensive metabolic panel    No Follow-up on file.

## 2012-09-11 NOTE — Assessment & Plan Note (Signed)
Asymmetric, secondary to ankle fracture,  Mild.

## 2012-09-11 NOTE — Assessment & Plan Note (Signed)
Given her age and absence of other risk factors for CAD, no further testing recommended .

## 2012-09-11 NOTE — Assessment & Plan Note (Signed)
She has persistent mild pain in the ankle since her fracture. Managed with tylenol.

## 2012-09-14 ENCOUNTER — Observation Stay: Payer: Self-pay | Admitting: Internal Medicine

## 2012-09-14 DIAGNOSIS — I447 Left bundle-branch block, unspecified: Secondary | ICD-10-CM | POA: Diagnosis not present

## 2012-09-14 DIAGNOSIS — R0789 Other chest pain: Secondary | ICD-10-CM | POA: Diagnosis not present

## 2012-09-14 DIAGNOSIS — Z9889 Other specified postprocedural states: Secondary | ICD-10-CM | POA: Diagnosis not present

## 2012-09-14 DIAGNOSIS — E785 Hyperlipidemia, unspecified: Secondary | ICD-10-CM | POA: Diagnosis not present

## 2012-09-14 DIAGNOSIS — I10 Essential (primary) hypertension: Secondary | ICD-10-CM | POA: Diagnosis not present

## 2012-09-14 DIAGNOSIS — Z8249 Family history of ischemic heart disease and other diseases of the circulatory system: Secondary | ICD-10-CM | POA: Diagnosis not present

## 2012-09-14 LAB — CK TOTAL AND CKMB (NOT AT ARMC)
CK, Total: 72 U/L (ref 21–215)
CK-MB: 2.4 ng/mL (ref 0.5–3.6)

## 2012-09-14 LAB — BASIC METABOLIC PANEL
Anion Gap: 5 — ABNORMAL LOW (ref 7–16)
Chloride: 102 mmol/L (ref 98–107)
Potassium: 3.4 mmol/L — ABNORMAL LOW (ref 3.5–5.1)

## 2012-09-14 LAB — CBC
MCH: 31.6 pg (ref 26.0–34.0)
MCV: 93 fL (ref 80–100)
RDW: 13.3 % (ref 11.5–14.5)

## 2012-09-14 LAB — TROPONIN I
Troponin-I: <0.02 ng/mL
Troponin-I: <0.02 ng/mL

## 2012-09-15 DIAGNOSIS — I10 Essential (primary) hypertension: Secondary | ICD-10-CM | POA: Diagnosis not present

## 2012-09-15 DIAGNOSIS — R42 Dizziness and giddiness: Secondary | ICD-10-CM | POA: Diagnosis not present

## 2012-09-15 DIAGNOSIS — I200 Unstable angina: Secondary | ICD-10-CM | POA: Diagnosis not present

## 2012-09-15 DIAGNOSIS — E785 Hyperlipidemia, unspecified: Secondary | ICD-10-CM | POA: Diagnosis not present

## 2012-09-15 DIAGNOSIS — I447 Left bundle-branch block, unspecified: Secondary | ICD-10-CM | POA: Diagnosis not present

## 2012-09-15 DIAGNOSIS — R0789 Other chest pain: Secondary | ICD-10-CM | POA: Diagnosis not present

## 2012-09-15 LAB — CK TOTAL AND CKMB (NOT AT ARMC)
CK, Total: 58 U/L (ref 21–215)
CK-MB: 1.1 ng/mL (ref 0.5–3.6)

## 2012-09-16 ENCOUNTER — Telehealth: Payer: Self-pay | Admitting: Internal Medicine

## 2012-09-16 DIAGNOSIS — I200 Unstable angina: Secondary | ICD-10-CM | POA: Diagnosis not present

## 2012-09-16 DIAGNOSIS — R42 Dizziness and giddiness: Secondary | ICD-10-CM | POA: Diagnosis not present

## 2012-09-16 DIAGNOSIS — I10 Essential (primary) hypertension: Secondary | ICD-10-CM | POA: Diagnosis not present

## 2012-09-16 DIAGNOSIS — R0789 Other chest pain: Secondary | ICD-10-CM | POA: Diagnosis not present

## 2012-09-16 DIAGNOSIS — I447 Left bundle-branch block, unspecified: Secondary | ICD-10-CM | POA: Diagnosis not present

## 2012-09-16 DIAGNOSIS — E785 Hyperlipidemia, unspecified: Secondary | ICD-10-CM | POA: Diagnosis not present

## 2012-09-16 LAB — LIPID PANEL
Triglycerides: 109 mg/dL (ref 0–200)
VLDL Cholesterol, Calc: 22 mg/dL (ref 5–40)

## 2012-09-16 NOTE — Telephone Encounter (Signed)
Pt had a stress test performed today, results should be sent to the office. Pt had been admitted in the hospital for chest pains. Pt husband states that she is feeling much better. Appt scheduled for 09/27/12.

## 2012-09-16 NOTE — Telephone Encounter (Signed)
Patient scheduled for a hospital follow up on 4.11.14

## 2012-09-20 DIAGNOSIS — I119 Hypertensive heart disease without heart failure: Secondary | ICD-10-CM | POA: Diagnosis not present

## 2012-09-20 DIAGNOSIS — I447 Left bundle-branch block, unspecified: Secondary | ICD-10-CM | POA: Diagnosis not present

## 2012-09-20 DIAGNOSIS — I209 Angina pectoris, unspecified: Secondary | ICD-10-CM | POA: Diagnosis not present

## 2012-09-20 DIAGNOSIS — E782 Mixed hyperlipidemia: Secondary | ICD-10-CM | POA: Diagnosis not present

## 2012-09-27 ENCOUNTER — Encounter: Payer: Self-pay | Admitting: Internal Medicine

## 2012-09-27 ENCOUNTER — Ambulatory Visit (INDEPENDENT_AMBULATORY_CARE_PROVIDER_SITE_OTHER): Payer: Medicare Other | Admitting: Internal Medicine

## 2012-09-27 VITALS — BP 128/60 | HR 67 | Temp 98.1°F | Resp 16 | Wt 131.2 lb

## 2012-09-27 DIAGNOSIS — R0789 Other chest pain: Secondary | ICD-10-CM

## 2012-09-27 NOTE — Progress Notes (Signed)
Patient ID: [name], female   DOB: [birth year], 79 y.o.   MRN: [id number]  Patient Active Problem List  Diagnosis  . Fatigue  . Other and unspecified hyperlipidemia  . Need for prophylactic vaccination and inoculation against influenza  . Hypertension  . History of ankle fracture  . Ankle edema  . Chest pain, atypical    Subjective:  CC:   Chief Complaint  Patient presents with  . Follow-up    Hospital    HPI:   [name] a 79 y.o. female who presents  For hospital follow up. On March  29, just 2 days after her annual physical, she was admitted to [facility] atypical chest pressure which occurred at rest and lasted several hours without remitting.  patient was taken to ER.  Had a LBBB on initial EKG in ER which disappeared on subsequent EKG. Was admitted, underwent evaluation with  serial cardiac enzymes which were negative and a myoview which was done on March 31 but the report is not complete.  Due to severe iodinated contrast allergy,  Cardiac cath was not offered.  She has a follow up with [facility] next week .  She was prescribed Imdur 30 mg on empty stomach by  BK, which caused recurrent nausea, so she talked with her pharmacist who recommended post prandial administration which improved her tolerance.  She has had no subsequent episodes of chest pressure since then.      Past Medical History  Diagnosis Date  . Cancer     basal cell Ca,  treated y Dr. Adriana Simas with Mohs procedure  . Hypertension   . Hyperlipidemia     Past Surgical History  Procedure Laterality Date  . Left ankle  Octo 2012    s/p pinning , Krazinski  . Abdominal hysterectomy    . Ankle      rods in both ankles       The following portions of the patient's history were reviewed and updated as appropriate: Allergies, current medications, and problem list.    Review of Systems:   Patient denies headache, fevers, malaise, unintentional weight loss, skin rash, eye pain, sinus congestion and sinus  pain, sore throat, dysphagia,  hemoptysis , cough, dyspnea, wheezing, chest pain, palpitations, orthopnea, edema, abdominal pain, nausea, melena, diarrhea, constipation, flank pain, dysuria, hematuria, urinary  Frequency, nocturia, numbness, tingling, seizures,  Focal weakness, Loss of consciousness,  Tremor, insomnia, depression, anxiety, and suicidal ideation.     History   Social History  . Marital Status: Married    Spouse Name: N/A    Number of Children: N/A  . Years of Education: N/A   Occupational History  . Not on file.   Social History Main Topics  . Smoking status: Never Smoker   . Smokeless tobacco: Never Used  . Alcohol Use: No  . Drug Use: No  . Sexually Active: Not on file   Other Topics Concern  . Not on file   Social History Narrative  . No narrative on file    Objective:  BP 128/60  Pulse 67  Temp(Src) 98.1 F (36.7 C) (Oral)  Resp 16  Wt 131 lb 4 oz (59.535 kg)  BMI 24 kg/m2  SpO2 98%  General appearance: alert, cooperative and appears stated age Ears: normal TM's and external ear canals both ears Throat: lips, mucosa, and tongue normal; teeth and gums normal Neck: no adenopathy, no carotid bruit, supple, symmetrical, trachea midline and thyroid not enlarged, symmetric, no tenderness/mass/nodules Back: symmetric, no  curvature. ROM normal. No CVA tenderness. Lungs: clear to auscultation bilaterally Heart: regular rate and rhythm, S1, S2 normal, no murmur, click, rub or gallop Abdomen: soft, non-tender; bowel sounds normal; no masses,  no organomegaly Pulses: 2+ and symmetric Skin: Skin color, texture, turgor normal. No rashes or lesions Lymph nodes: Cervical, supraclavicular, and axillary nodes normal.  Assessment and Plan:  Chest pain, atypical Resolved.  Noninvasive testing during recent [facility] admission included only a myoview and serial enzymes.  The myoview report is incomplete but upon my review of images available on [facility], it appears that  the LBBB was present during stress and recovery images.    Imdur , low dose was started by cardiology and patient is currently asymptomatic since discharge.   Follow up office  visit with BK is next week,  No ECHO done during weekend admission..  She has a history of a severe reaction to iodinated contrast allergy.    Updated Medication List Outpatient Encounter Prescriptions as of 09/27/2012  Medication Sig Dispense Refill  . amLODipine (NORVASC) 5 MG tablet TAKE 1 TABLET BY MOUTH DAILY.  30 tablet  6  . aspirin 81 MG tablet Take 81 mg by mouth daily.        . Calcium Carbonate-Vitamin D (CALCIUM + D) 600-200 MG-UNIT TABS Take by mouth.        . captopril-hydrochlorothiazide (CAPOZIDE) 25-15 MG per tablet TAKE 1 TABLET BY MOUTH TWICE A DAY  60 tablet  3  . fish oil-omega-3 fatty acids 1000 MG capsule Take by mouth daily.        . isosorbide dinitrate (ISORDIL) 30 MG tablet Take 30 mg by mouth daily.      . Multiple Vitamin (MULTIVITAMIN) tablet Take 1 tablet by mouth daily.        . niacin 500 MG tablet Take 500 mg by mouth daily with breakfast.        . amoxicillin (AMOXIL) 500 MG capsule Take 4 tablets 1 hour before dental procedure  4 capsule  1   No facility-administered encounter medications on file as of 09/27/2012.

## 2012-09-29 ENCOUNTER — Encounter: Payer: Self-pay | Admitting: Internal Medicine

## 2012-09-29 DIAGNOSIS — R0789 Other chest pain: Secondary | ICD-10-CM | POA: Insufficient documentation

## 2012-09-29 NOTE — Assessment & Plan Note (Addendum)
Resolved.  Noninvasive testing during recent [facility] admission included only a myoview and serial enzymes.  The myoview report is incomplete but upon my review of images available on [facility], it appears that the LBBB was present during stress and recovery images.    Imdur , low dose was started by cardiology and patient is currently asymptomatic since discharge.   Follow up office  visit with BK is next week,  No ECHO done during weekend admission..  She has a history of a severe reaction to iodinated contrast allergy.

## 2012-10-01 ENCOUNTER — Other Ambulatory Visit: Payer: Self-pay | Admitting: *Deleted

## 2012-10-01 MED ORDER — AMLODIPINE BESYLATE 5 MG PO TABS: ORAL_TABLET | ORAL | Status: DC

## 2012-10-01 MED ORDER — CAPTOPRIL-HYDROCHLOROTHIAZIDE 25-15 MG PO TABS: ORAL_TABLET | ORAL | Status: DC

## 2012-10-01 NOTE — Telephone Encounter (Signed)
Med filled.  

## 2012-10-02 DIAGNOSIS — I447 Left bundle-branch block, unspecified: Secondary | ICD-10-CM | POA: Diagnosis not present

## 2012-10-02 DIAGNOSIS — E782 Mixed hyperlipidemia: Secondary | ICD-10-CM | POA: Diagnosis not present

## 2012-10-02 DIAGNOSIS — I209 Angina pectoris, unspecified: Secondary | ICD-10-CM | POA: Diagnosis not present

## 2012-10-02 DIAGNOSIS — I119 Hypertensive heart disease without heart failure: Secondary | ICD-10-CM | POA: Diagnosis not present

## 2012-10-09 ENCOUNTER — Telehealth: Payer: Self-pay | Admitting: Internal Medicine

## 2012-10-09 DIAGNOSIS — Z1239 Encounter for other screening for malignant neoplasm of breast: Secondary | ICD-10-CM

## 2012-10-09 NOTE — Telephone Encounter (Signed)
Referral is in process as requested 

## 2012-10-09 NOTE — Telephone Encounter (Signed)
Patient needing a mammogram order for [place].

## 2012-10-09 NOTE — Telephone Encounter (Signed)
Patient notified Joice Lofts will be contacting her.

## 2012-10-15 DIAGNOSIS — R0789 Other chest pain: Secondary | ICD-10-CM | POA: Diagnosis not present

## 2012-10-22 DIAGNOSIS — I447 Left bundle-branch block, unspecified: Secondary | ICD-10-CM | POA: Diagnosis not present

## 2012-10-22 DIAGNOSIS — R0789 Other chest pain: Secondary | ICD-10-CM | POA: Diagnosis not present

## 2012-10-22 DIAGNOSIS — R0609 Other forms of dyspnea: Secondary | ICD-10-CM | POA: Diagnosis not present

## 2012-10-22 DIAGNOSIS — E7800 Pure hypercholesterolemia, unspecified: Secondary | ICD-10-CM | POA: Diagnosis not present

## 2012-10-22 DIAGNOSIS — I10 Essential (primary) hypertension: Secondary | ICD-10-CM | POA: Diagnosis not present

## 2012-10-23 ENCOUNTER — Encounter (HOSPITAL_COMMUNITY): Payer: Self-pay | Admitting: Pharmacy Technician

## 2012-10-31 ENCOUNTER — Ambulatory Visit (HOSPITAL_COMMUNITY)
Admission: RE | Admit: 2012-10-31 | Discharge: 2012-10-31 | Disposition: A | Discharge status: Home / Self Care | Payer: Medicare Other | Source: Ambulatory Visit | Attending: Cardiology | Admitting: Cardiology

## 2012-10-31 ENCOUNTER — Encounter (HOSPITAL_COMMUNITY)
Admission: RE | Disposition: A | Discharge status: Home / Self Care | Payer: Self-pay | Source: Ambulatory Visit | Attending: Cardiology

## 2012-10-31 DIAGNOSIS — I209 Angina pectoris, unspecified: Secondary | ICD-10-CM | POA: Diagnosis not present

## 2012-10-31 DIAGNOSIS — I10 Essential (primary) hypertension: Secondary | ICD-10-CM | POA: Insufficient documentation

## 2012-10-31 DIAGNOSIS — Z8249 Family history of ischemic heart disease and other diseases of the circulatory system: Secondary | ICD-10-CM | POA: Diagnosis not present

## 2012-10-31 DIAGNOSIS — E785 Hyperlipidemia, unspecified: Secondary | ICD-10-CM | POA: Insufficient documentation

## 2012-10-31 DIAGNOSIS — I2510 Atherosclerotic heart disease of native coronary artery without angina pectoris: Secondary | ICD-10-CM | POA: Insufficient documentation

## 2012-10-31 DIAGNOSIS — E7800 Pure hypercholesterolemia, unspecified: Secondary | ICD-10-CM | POA: Diagnosis not present

## 2012-10-31 HISTORY — PX: LEFT HEART CATHETERIZATION WITH CORONARY ANGIOGRAM: SHX5451

## 2012-10-31 SURGERY — LEFT HEART CATHETERIZATION WITH CORONARY ANGIOGRAM
Anesthesia: LOCAL

## 2012-10-31 MED ORDER — SODIUM CHLORIDE 0.9 % IV SOLN: INTRAVENOUS | Status: DC

## 2012-10-31 MED ORDER — SODIUM CHLORIDE 0.9 % IJ SOLN: 3.0000 mL | INTRAMUSCULAR | Status: DC | PRN

## 2012-10-31 MED ORDER — DIPHENHYDRAMINE HCL 50 MG/ML IJ SOLN
25.0000 mg | INTRAMUSCULAR | Status: AC
  Administered 2012-10-31: 25 mg via INTRAVENOUS

## 2012-10-31 MED ORDER — DIAZEPAM 5 MG PO TABS
ORAL_TABLET | ORAL | Status: AC
  Filled 2012-10-31: qty 1

## 2012-10-31 MED ORDER — DIPHENHYDRAMINE HCL 50 MG/ML IJ SOLN
INTRAMUSCULAR | Status: AC
  Filled 2012-10-31: qty 1

## 2012-10-31 MED ORDER — FENTANYL CITRATE 0.05 MG/ML IJ SOLN
INTRAMUSCULAR | Status: AC
  Filled 2012-10-31: qty 2

## 2012-10-31 MED ORDER — SODIUM CHLORIDE 0.9 % IJ SOLN: 3.0000 mL | Freq: Two times a day (BID) | INTRAMUSCULAR | Status: DC

## 2012-10-31 MED ORDER — DIAZEPAM 5 MG PO TABS
5.0000 mg | ORAL_TABLET | ORAL | Status: AC
  Administered 2012-10-31: 5 mg via ORAL

## 2012-10-31 MED ORDER — HEPARIN (PORCINE) IN NACL 2-0.9 UNIT/ML-% IJ SOLN
INTRAMUSCULAR | Status: AC
  Filled 2012-10-31: qty 1000

## 2012-10-31 MED ORDER — METHYLPREDNISOLONE SODIUM SUCC 125 MG IJ SOLR
125.0000 mg | INTRAMUSCULAR | Status: AC
  Administered 2012-10-31: 125 mg via INTRAVENOUS

## 2012-10-31 MED ORDER — FAMOTIDINE IN NACL 20-0.9 MG/50ML-% IV SOLN
INTRAVENOUS | Status: AC
  Filled 2012-10-31: qty 50

## 2012-10-31 MED ORDER — SODIUM CHLORIDE 0.9 % IV SOLN
INTRAVENOUS | Status: DC
  Administered 2012-10-31: 1000 mL via INTRAVENOUS

## 2012-10-31 MED ORDER — ASPIRIN 81 MG PO CHEW
CHEWABLE_TABLET | ORAL | Status: AC
  Filled 2012-10-31: qty 4

## 2012-10-31 MED ORDER — OXYCODONE-ACETAMINOPHEN 5-325 MG PO TABS: 1.0000 | ORAL_TABLET | ORAL | Status: DC | PRN

## 2012-10-31 MED ORDER — FAMOTIDINE IN NACL 20-0.9 MG/50ML-% IV SOLN
20.0000 mg | INTRAVENOUS | Status: AC
  Administered 2012-10-31: 20 mg via INTRAVENOUS

## 2012-10-31 MED ORDER — ASPIRIN 81 MG PO CHEW
324.0000 mg | CHEWABLE_TABLET | ORAL | Status: AC
  Administered 2012-10-31: 324 mg via ORAL

## 2012-10-31 MED ORDER — ONDANSETRON HCL 4 MG/2ML IJ SOLN: 4.0000 mg | Freq: Four times a day (QID) | INTRAMUSCULAR | Status: DC | PRN

## 2012-10-31 MED ORDER — METHYLPREDNISOLONE SODIUM SUCC 125 MG IJ SOLR
INTRAMUSCULAR | Status: AC
  Filled 2012-10-31: qty 2

## 2012-10-31 MED ORDER — LIDOCAINE HCL (PF) 1 % IJ SOLN
INTRAMUSCULAR | Status: AC
  Filled 2012-10-31: qty 30

## 2012-10-31 MED ORDER — SODIUM CHLORIDE 0.9 % IV SOLN: 250.0000 mL | INTRAVENOUS | Status: DC | PRN

## 2012-10-31 MED ORDER — ACETAMINOPHEN 325 MG PO TABS: 650.0000 mg | ORAL_TABLET | ORAL | Status: DC | PRN

## 2012-10-31 NOTE — Progress Notes (Signed)
[address] '[name] advised of contrast dye allergy.  She will follow up with Dr Sharyn Lull.  Patient states she has been taking prednisone x 5 days

## 2012-10-31 NOTE — H&P (Signed)
  H&P in the chart needs to the scan

## 2012-10-31 NOTE — CV Procedure (Signed)
Cardiac cath report dictated on 5:15 14 dictation number is [phone]

## 2012-11-01 NOTE — Cardiovascular Report (Signed)
NAMEMIYOKO, HASHIMI                ACCOUNT NO.:  [id number]  MEDICAL RECORD NO.:  [id number]  LOCATION:  [facility]                         FACILITY:  [facility]  PHYSICIAN:  Sandrine Bloodsworth N. Sharyn Lull, M.D. DATE OF BIRTH:  1923/01/23  DATE OF PROCEDURE:  10/31/2012 DATE OF DISCHARGE:  10/31/2012                           CARDIAC CATHETERIZATION   PROCEDURE:  Left cardiac catheterization with selective left and right coronary angiography, left ventriculography via right groin using Judkins technique.  INDICATION FOR THE PROCEDURE:  Ms. Harroun is an 79 year old female with past medical history significant for hypertension, hypercholesteremia, history of positive family history of coronary artery disease.  She complained of retrosternal chest pain described as heaviness associated with shortness of breath, relieved with rest.  The patient denies any nausea, vomiting, or diaphoresis.  Denies palpitation, lightheadedness, or syncope.  The patient had stress Myoview in March in [place], which was equivocal with normal ejection fraction.  The patient was treated medically.  The patient continues to have chest heaviness associated with mild exertion, relieved with rest.  Denies any claudication pain.  Denies any leg weakness or arm weakness.  Due to multiple risk factors and typical anginal chest pain, discussed with the patient regarding left cath, possible PTCA, stenting, its risks and benefits, i.e., death, MI, stroke, need for emergency CABG, risk of restenosis, local vascular complications, etc., and consented for PCI.  PROCEDURE:  After obtaining the informed consent, the patient was brought to the Cath Lab and was placed on fluoroscopy table.  Right groin was prepped and draped in usual fashion.  Xylocaine 1% was used for local anesthesia in the right groin.  With the help of thin wall needle, 5-French arterial sheath was placed.  The sheath was aspirated and flushed.  Next, 5-French left Judkins  catheter was advanced over the wire under fluoroscopic guidance up to the ascending aorta.  Wire was pulled out, the catheter was aspirated and connected to the Manifold. Catheter was further advanced and engaged into left coronary ostium. Multiple views of the left system were taken.  Next, catheter was disengaged and was pulled out over the wire and was replaced with 5- [place] right Judkins catheter, which was advanced over the wire under fluoroscopic guidance up to the ascending aorta.  Wire was pulled out, the catheter was aspirated and connected to the Manifold.  Catheter was further advanced and engaged into right coronary ostium.  Multiple views of the right system were taken.  Next, catheter was disengaged and was pulled out over the wire, and was replaced with 5-French pigtail catheter, which was advanced over the wire under fluoroscopic guidance up to the ascending aorta.  Wire was pulled out, the catheter was aspirated and connected to the Manifold.  Catheter was further advanced across the aortic valve into the LV.  LV pressures were recorded.  Next, left ventriculography was done in 30-degree RAO position.  Post- angiographic pressures were recorded from LV and then pullback pressures were recorded from the aorta.  There was no gradient across the aortic valve.  Next, pigtail catheter was pulled out over the wire, sheaths were aspirated and flushed.  FINDINGS:  LV showed good LV systolic function, LVH,  EF of approximately 55-60%.  Left main was short, which was patent.  LAD has 20-30% proximal stenosis.  Diagonal 1 and 2 were patent.  Left circumflex has 20-25% proximal stenosis, otherwise patent.  OM 1 was very very small.  RCA was large, which was patent.  PDA and PLV branches were patent.  The patient tolerated the procedure well.  There were no complications.  The patient was transferred to recovery room in stable condition.     Eduardo Osier. Sharyn Lull,  M.D.     MNH/MEDQ  D:  10/31/2012  T:  11/01/2012  Job:  [id number]

## 2012-11-06 DIAGNOSIS — I2510 Atherosclerotic heart disease of native coronary artery without angina pectoris: Secondary | ICD-10-CM | POA: Diagnosis not present

## 2012-11-06 DIAGNOSIS — E7800 Pure hypercholesterolemia, unspecified: Secondary | ICD-10-CM | POA: Diagnosis not present

## 2012-11-06 DIAGNOSIS — I10 Essential (primary) hypertension: Secondary | ICD-10-CM | POA: Diagnosis not present

## 2012-11-20 ENCOUNTER — Ambulatory Visit: Payer: Self-pay | Admitting: Internal Medicine

## 2012-11-20 DIAGNOSIS — Z1231 Encounter for screening mammogram for malignant neoplasm of breast: Secondary | ICD-10-CM | POA: Diagnosis not present

## 2012-12-18 ENCOUNTER — Other Ambulatory Visit: Payer: Self-pay | Admitting: *Deleted

## 2012-12-18 MED ORDER — CAPTOPRIL-HYDROCHLOROTHIAZIDE 25-15 MG PO TABS: ORAL_TABLET | ORAL | Status: DC

## 2012-12-31 ENCOUNTER — Encounter: Payer: Self-pay | Admitting: Internal Medicine

## 2013-02-03 DIAGNOSIS — I10 Essential (primary) hypertension: Secondary | ICD-10-CM | POA: Diagnosis not present

## 2013-02-03 DIAGNOSIS — R0609 Other forms of dyspnea: Secondary | ICD-10-CM | POA: Diagnosis not present

## 2013-02-03 DIAGNOSIS — I2510 Atherosclerotic heart disease of native coronary artery without angina pectoris: Secondary | ICD-10-CM | POA: Diagnosis not present

## 2013-02-03 DIAGNOSIS — E7800 Pure hypercholesterolemia, unspecified: Secondary | ICD-10-CM | POA: Diagnosis not present

## 2013-02-21 DIAGNOSIS — I10 Essential (primary) hypertension: Secondary | ICD-10-CM | POA: Diagnosis not present

## 2013-02-21 DIAGNOSIS — E7800 Pure hypercholesterolemia, unspecified: Secondary | ICD-10-CM | POA: Diagnosis not present

## 2013-02-21 DIAGNOSIS — I2510 Atherosclerotic heart disease of native coronary artery without angina pectoris: Secondary | ICD-10-CM | POA: Diagnosis not present

## 2013-02-21 DIAGNOSIS — R0609 Other forms of dyspnea: Secondary | ICD-10-CM | POA: Diagnosis not present

## 2013-03-14 DIAGNOSIS — Z23 Encounter for immunization: Secondary | ICD-10-CM | POA: Diagnosis not present

## 2013-03-19 ENCOUNTER — Encounter: Payer: Self-pay | Admitting: Internal Medicine

## 2013-03-19 ENCOUNTER — Ambulatory Visit (INDEPENDENT_AMBULATORY_CARE_PROVIDER_SITE_OTHER): Payer: Medicare Other | Admitting: Internal Medicine

## 2013-03-19 VITALS — BP 108/60 | HR 61 | Temp 97.9°F | Resp 12 | Ht 58.09 in | Wt 130.2 lb

## 2013-03-19 DIAGNOSIS — E785 Hyperlipidemia, unspecified: Secondary | ICD-10-CM | POA: Diagnosis not present

## 2013-03-19 DIAGNOSIS — I10 Essential (primary) hypertension: Secondary | ICD-10-CM

## 2013-03-19 DIAGNOSIS — Z888 Allergy status to other drugs, medicaments and biological substances status: Secondary | ICD-10-CM

## 2013-03-19 DIAGNOSIS — Z0000 Encounter for general adult medical examination without abnormal findings: Secondary | ICD-10-CM | POA: Diagnosis not present

## 2013-03-19 DIAGNOSIS — R5381 Other malaise: Secondary | ICD-10-CM | POA: Diagnosis not present

## 2013-03-19 DIAGNOSIS — R0789 Other chest pain: Secondary | ICD-10-CM

## 2013-03-19 DIAGNOSIS — E559 Vitamin D deficiency, unspecified: Secondary | ICD-10-CM

## 2013-03-19 DIAGNOSIS — Z789 Other specified health status: Secondary | ICD-10-CM | POA: Insufficient documentation

## 2013-03-19 DIAGNOSIS — Z1211 Encounter for screening for malignant neoplasm of colon: Secondary | ICD-10-CM

## 2013-03-19 LAB — COMPREHENSIVE METABOLIC PANEL
AST: 23 U/L (ref 0–37)
Albumin: 3.8 g/dL (ref 3.5–5.2)
Alkaline Phosphatase: 48 U/L (ref 39–117)
BUN: 19 mg/dL (ref 6–23)
Calcium: 9.6 mg/dL (ref 8.4–10.5)
Creatinine, Ser: 1 mg/dL (ref 0.4–1.2)
Glucose, Bld: 93 mg/dL (ref 70–99)

## 2013-03-19 MED ORDER — TETANUS-DIPHTH-ACELL PERTUSSIS 5-2.5-18.5 LF-MCG/0.5 IM SUSP: 0.5000 mL | Freq: Once | INTRAMUSCULAR | Status: DC

## 2013-03-19 NOTE — Assessment & Plan Note (Signed)
She did not tolerate pravastatin due to joint pain

## 2013-03-19 NOTE — Assessment & Plan Note (Signed)
Well controlled on current regimen. Renal function stable, no changes today. 

## 2013-03-19 NOTE — Progress Notes (Signed)
Patient ID: [name], female   DOB: 07-02-1922, 79 y.o.   MRN: [id number]  The patient is here for annual Medicare wellness examination and management of other chronic and acute problems.  She was hospitalized in May for chest pain and underwent cardiac  Catheterization . However I cannot access the report or the discharge summary .  Apparently the catheterization was normal but she was discharged on pravastatin, which she was not tolerated due to joint pain which was so severe it was preventing her from going for her daily two mile walk.  Since she discontinued the statin her joint pain has resolved.   2) She Has been waking up at 1 am every morning  Goes back to sleep after an hour  Gets up and reads until sleepy again No alcohol use. .,    The risk factors are reflected in the social history.  The roster of all physicians providing medical care to patient - is listed in the Snapshot section of the chart.  Activities of daily living:  The patient is 100% independent in all ADLs: dressing, toileting, feeding as well as independent mobility  Home safety : The patient has smoke detectors in the home. They wear seatbelts.  There are no firearms at home. There is no violence in the home.   There is no risks for hepatitis, STDs or HIV. There is no   history of blood transfusion. They have no travel history to infectious disease endemic areas of the world.  The patient has seen their dentist in the last six month. They have seen their eye doctor in the last year. They admit to slight hearing difficulty with regard to whispered voices and some television programs.  They have deferred audiologic testing in the last year.  They do not  have excessive sun exposure. Discussed the need for sun protection: hats, long sleeves and use of sunscreen if there is significant sun exposure.   Diet: the importance of a healthy diet is discussed. They do have a healthy diet.  The benefits of regular aerobic  exercise were discussed. She walks 5 times per week ,  2 miles .   Depression screen: there are no signs or vegative symptoms of depression- irritability, change in appetite, anhedonia, sadness/tearfullness.  Cognitive assessment: the patient manages all their financial and personal affairs and is actively engaged. They could relate day,date,year and events; recalled 2/3 objects at 3 minutes; performed clock-face test normally.  The following portions of the patient's history were reviewed and updated as appropriate: allergies, current medications, past family history, past medical history,  past surgical history, past social history  and problem list.  Visual acuity was not assessed per patient preference since she has regular follow up with her ophthalmologist. Hearing and body mass index were assessed and reviewed.   During the course of the visit the patient was educated and counseled about appropriate screening and preventive services including : fall prevention , diabetes screening, nutrition counseling, colorectal cancer screening, and recommended immunizations.    Objective:  BP 108/60  Pulse 61  Temp(Src) 97.9 F (36.6 C) (Oral)  Resp 12  Ht 4' 10.09" (1.475 m)  Wt 130 lb 4 oz (59.081 kg)  BMI 27.16 kg/m2  SpO2 96%  General Appearance:    Alert, cooperative, no distress, appears stated age  Head:    Normocephalic, without obvious abnormality, atraumatic  Eyes:    PERRL, conjunctiva/corneas clear, EOM's intact, fundi    benign, both eyes  Ears:  Normal TM's and external ear canals, both ears  Nose:   Nares normal, septum midline, mucosa normal, no drainage    or sinus tenderness  Throat:   Lips, mucosa, and tongue normal; teeth and gums normal  Neck:   Supple, symmetrical, trachea midline, no adenopathy;    thyroid:  no enlargement/tenderness/nodules; no carotid   bruit or JVD  Back:     Symmetric, no curvature, ROM normal, no CVA tenderness  Lungs:     Clear to  auscultation bilaterally, respirations unlabored  Chest Wall:    No tenderness or deformity   Heart:    Regular rate and rhythm, S1 and S2 normal, no murmur, rub   or gallop  Breast Exam:    No tenderness, masses, or nipple abnormality  Abdomen:     Soft, non-tender, bowel sounds active all four quadrants,    no masses, no organomegaly     Extremities:   Extremities normal, atraumatic, no cyanosis or edema  Pulses:   2+ and symmetric all extremities  Skin:   Skin color, texture, turgor normal, no rashes or lesions  Lymph nodes:   Cervical, supraclavicular, and axillary nodes normal  Neurologic:   CNII-XII intact, normal strength, sensation and reflexes    throughout    Assessment and Plan:  Statin intolerance She did not tolerate pravastatin due to joint pain  Other and unspecified hyperlipidemia She did not tolerate pravastatin due to joint pain  Chest pain, atypical She underwent cardiac catheterization in May for recurrent chest pain which was apparently normal. Records are unavailable for review   Routine general medical examination at a health care facility Annual comprehensive exam was done including breast exam. All screenings have been addressed .   Hypertension Well controlled on current regimen. Renal function stable, no changes today.   Updated Medication List Outpatient Encounter Prescriptions as of 03/19/2013  Medication Sig Dispense Refill  . amLODipine (NORVASC) 5 MG tablet TAKE 1 TABLET BY MOUTH DAILY.  30 tablet  6  . aspirin 81 MG tablet Take 81 mg by mouth daily.        . Calcium Carbonate-Vitamin D (CALCIUM + D) 600-200 MG-UNIT TABS Take by mouth.        . captopril-hydrochlorothiazide (CAPOZIDE) 25-15 MG per tablet TAKE 1 TABLET BY MOUTH TWICE A DAY  60 tablet  5  . fish oil-omega-3 fatty acids 1000 MG capsule Take by mouth daily.        . isosorbide mononitrate (IMDUR) 30 MG 24 hr tablet Take 30 mg by mouth daily.      . Multiple Vitamin (MULTIVITAMIN)  tablet Take 1 tablet by mouth daily.        . niacin 500 MG tablet Take 500 mg by mouth daily with breakfast.        . famotidine (PEPCID) 20 MG tablet Take 20 mg by mouth 2 (two) times daily.      . pravastatin (PRAVACHOL) 20 MG tablet Take 20 mg by mouth daily.      . TDaP (BOOSTRIX) 5-2.5-18.5 LF-MCG/0.5 injection Inject 0.5 mLs into the muscle once.  0.5 mL  0  . [DISCONTINUED] metoprolol succinate (TOPROL-XL) 25 MG 24 hr tablet Take 25 mg by mouth daily.       No facility-administered encounter medications on file as of 03/19/2013.

## 2013-03-19 NOTE — Assessment & Plan Note (Signed)
Annual comprehensive exam was done including breast exam .  All screenings have been addressed .  

## 2013-03-19 NOTE — Assessment & Plan Note (Signed)
She did not tolerate pravastatin due to joint pain 

## 2013-03-19 NOTE — Patient Instructions (Addendum)
You had your annual Medicare wellness exam today  We will schedule your mammogram next may  Please use the stool kit to send [place] back a sample to test for blood.  This is your colon CA screening test.   You need to have a TDaP vaccine and a second Pneumonia vaccine when it becomes available here .  I have given you prescriptions for the TDaP vaccine  because it  will be cheaper at the health Dept or at your local pharmacy because Medicare will not reimburse for  It  We will contact you with the bloodwork results

## 2013-03-19 NOTE — Assessment & Plan Note (Signed)
She underwent cardiac catheterization in May for recurrent chest pain which was apparently normal. Records are unavailable for review

## 2013-03-20 ENCOUNTER — Other Ambulatory Visit: Payer: Self-pay | Admitting: *Deleted

## 2013-03-20 LAB — LIPID PANEL
Cholesterol: 269 mg/dL — ABNORMAL HIGH (ref 0–200)
HDL: 58.7 mg/dL (ref >39.00)
VLDL: 21 mg/dL (ref 0.0–40.0)

## 2013-03-20 LAB — TSH: TSH: 2.29 u[IU]/mL (ref 0.35–5.50)

## 2013-03-20 LAB — VITAMIN D 25 HYDROXY (VIT D DEFICIENCY, FRACTURES): Vit D, 25-Hydroxy: 52 ng/mL (ref 30–89)

## 2013-03-20 MED ORDER — AMOXICILLIN 500 MG PO TABS: 500.0000 mg | ORAL_TABLET | Freq: Two times a day (BID) | ORAL | Status: DC

## 2013-03-21 ENCOUNTER — Encounter: Payer: Self-pay | Admitting: Internal Medicine

## 2013-03-25 ENCOUNTER — Other Ambulatory Visit: Payer: Medicare Other

## 2013-03-25 ENCOUNTER — Encounter: Payer: Self-pay | Admitting: *Deleted

## 2013-03-25 LAB — FECAL OCCULT BLOOD, IMMUNOCHEMICAL: Fecal Occult Bld: NEGATIVE

## 2013-03-26 ENCOUNTER — Encounter: Payer: Self-pay | Admitting: *Deleted

## 2013-04-06 DIAGNOSIS — R300 Dysuria: Secondary | ICD-10-CM | POA: Diagnosis not present

## 2013-04-06 DIAGNOSIS — B379 Candidiasis, unspecified: Secondary | ICD-10-CM | POA: Diagnosis not present

## 2013-04-06 DIAGNOSIS — N771 Vaginitis, vulvitis and vulvovaginitis in diseases classified elsewhere: Secondary | ICD-10-CM | POA: Diagnosis not present

## 2013-04-17 ENCOUNTER — Encounter: Payer: Self-pay | Admitting: Adult Health

## 2013-04-17 ENCOUNTER — Ambulatory Visit (INDEPENDENT_AMBULATORY_CARE_PROVIDER_SITE_OTHER): Payer: Medicare Other | Admitting: Adult Health

## 2013-04-17 VITALS — BP 138/60 | HR 59 | Temp 97.9°F | Resp 12 | Wt 132.0 lb

## 2013-04-17 DIAGNOSIS — R21 Rash and other nonspecific skin eruption: Secondary | ICD-10-CM | POA: Diagnosis not present

## 2013-04-17 MED ORDER — TRIAMCINOLONE ACETONIDE 0.1 % EX CREA: TOPICAL_CREAM | Freq: Two times a day (BID) | CUTANEOUS | Status: DC

## 2013-04-17 MED ORDER — DIPHENHYDRAMINE HCL 25 MG PO CAPS
25.0000 mg | ORAL_CAPSULE | Freq: Four times a day (QID) | ORAL | Status: DC | PRN
Start: 2013-04-17 — End: 2014-10-28

## 2013-04-17 NOTE — Patient Instructions (Signed)
  Apply triamcinolone cream to the affected area twice daily.  Avoid perfumed soaps, lotions.  Use a mild soap such as ivory or dove  Take benadryl every 6 hours for the next 2 days then take every 6 hours only as needed. This is for the rash on your lower abdomen.  Benadryl tablets are 25 mg. If these are too sedating you may cut the tablet in half.  If your symptoms (rash) worsens, I will prescribed a prednisone taper.

## 2013-04-17 NOTE — Progress Notes (Signed)
  Subjective:    Patient ID: [name], female    DOB: March 06, 1923, 79 y.o.   MRN: [id number]  HPI  Pt is pleasant 79 yo female. Pt presents with redness and itching to groin and lower abdomen. Pt was seen at Urgent Care 1.5 weeks ago for same and given Nystatin cream which has not relieved symptoms. She reports that culture showed no yeast. Pt denies taking any new medication, states she changes her lotions and body washes frequently. She has not changed detergents and has not taken any benadryl for her symptoms of itching. She completed the entire tube of nystatin that was prescribed at urgent care.  Current Outpatient Prescriptions on File Prior to Visit  Medication Sig Dispense Refill  . amLODipine (NORVASC) 5 MG tablet TAKE 1 TABLET BY MOUTH DAILY.  30 tablet  6  . aspirin 81 MG tablet Take 81 mg by mouth daily.        . Calcium Carbonate-Vitamin D (CALCIUM + D) 600-200 MG-UNIT TABS Take by mouth.        . captopril-hydrochlorothiazide (CAPOZIDE) 25-15 MG per tablet TAKE 1 TABLET BY MOUTH TWICE A DAY  60 tablet  5  . famotidine (PEPCID) 20 MG tablet Take 20 mg by mouth 2 (two) times daily.      . fish oil-omega-3 fatty acids 1000 MG capsule Take by mouth daily.        . isosorbide mononitrate (IMDUR) 30 MG 24 hr tablet Take 30 mg by mouth daily.      . Multiple Vitamin (MULTIVITAMIN) tablet Take 1 tablet by mouth daily.        . niacin 500 MG tablet Take 500 mg by mouth daily with breakfast.        . pravastatin (PRAVACHOL) 20 MG tablet Take 20 mg by mouth daily.      Marland Kitchen amoxicillin (AMOXIL) 500 MG tablet Take 1 tablet (500 mg total) by mouth 2 (two) times daily. Take 4 capsules one hour prior to dental procedure  4 tablet  0  . TDaP (BOOSTRIX) 5-2.5-18.5 LF-MCG/0.5 injection Inject 0.5 mLs into the muscle once.  0.5 mL  0   No current facility-administered medications on file prior to visit.     Review of Systems  Constitutional: Negative for fever, chills and fatigue.  Respiratory:  Negative.   Cardiovascular: Negative.   Skin: Positive for rash.       Macular rash to lower abdomen. Erythema to pubic symphysis and genitalia. Some also noted on groin and upper inner thighs bilaterally.       Objective:   Physical Exam  Constitutional: She is oriented to person, place, and time. She appears well-developed and well-nourished.  Neurological: She is alert and oriented to person, place, and time.  Skin: Rash noted. There is erythema.  Psychiatric: She has a normal mood and affect. Her behavior is normal. Judgment and thought content normal.    BP 138/60  Pulse 59  Temp(Src) 97.9 F (36.6 C) (Oral)  Resp 12  Wt 132 lb (59.875 kg)  BMI 27.52 kg/m2  SpO2 96%       Assessment & Plan:

## 2013-04-17 NOTE — Assessment & Plan Note (Signed)
Rash has an allergic appearance especially on the abdomen. Benadryl 25 mg every 6 hours for 2 days then prn. May take 1/2 tablet if too sedating. Triamcinolone cream to external genitalia. If no improvement by Monday, consider prednisone taper.

## 2013-05-16 IMAGING — CR DG ANKLE 2V *L*
1 series · 2 of 2 positions shown · non-contrast
Comparison: none

REASON FOR EXAM: fell on curb
COMMENTS:

[Series 1: view not recorded · 0.17mm/px · 2 of 2 slices shown]
[im 1/2]
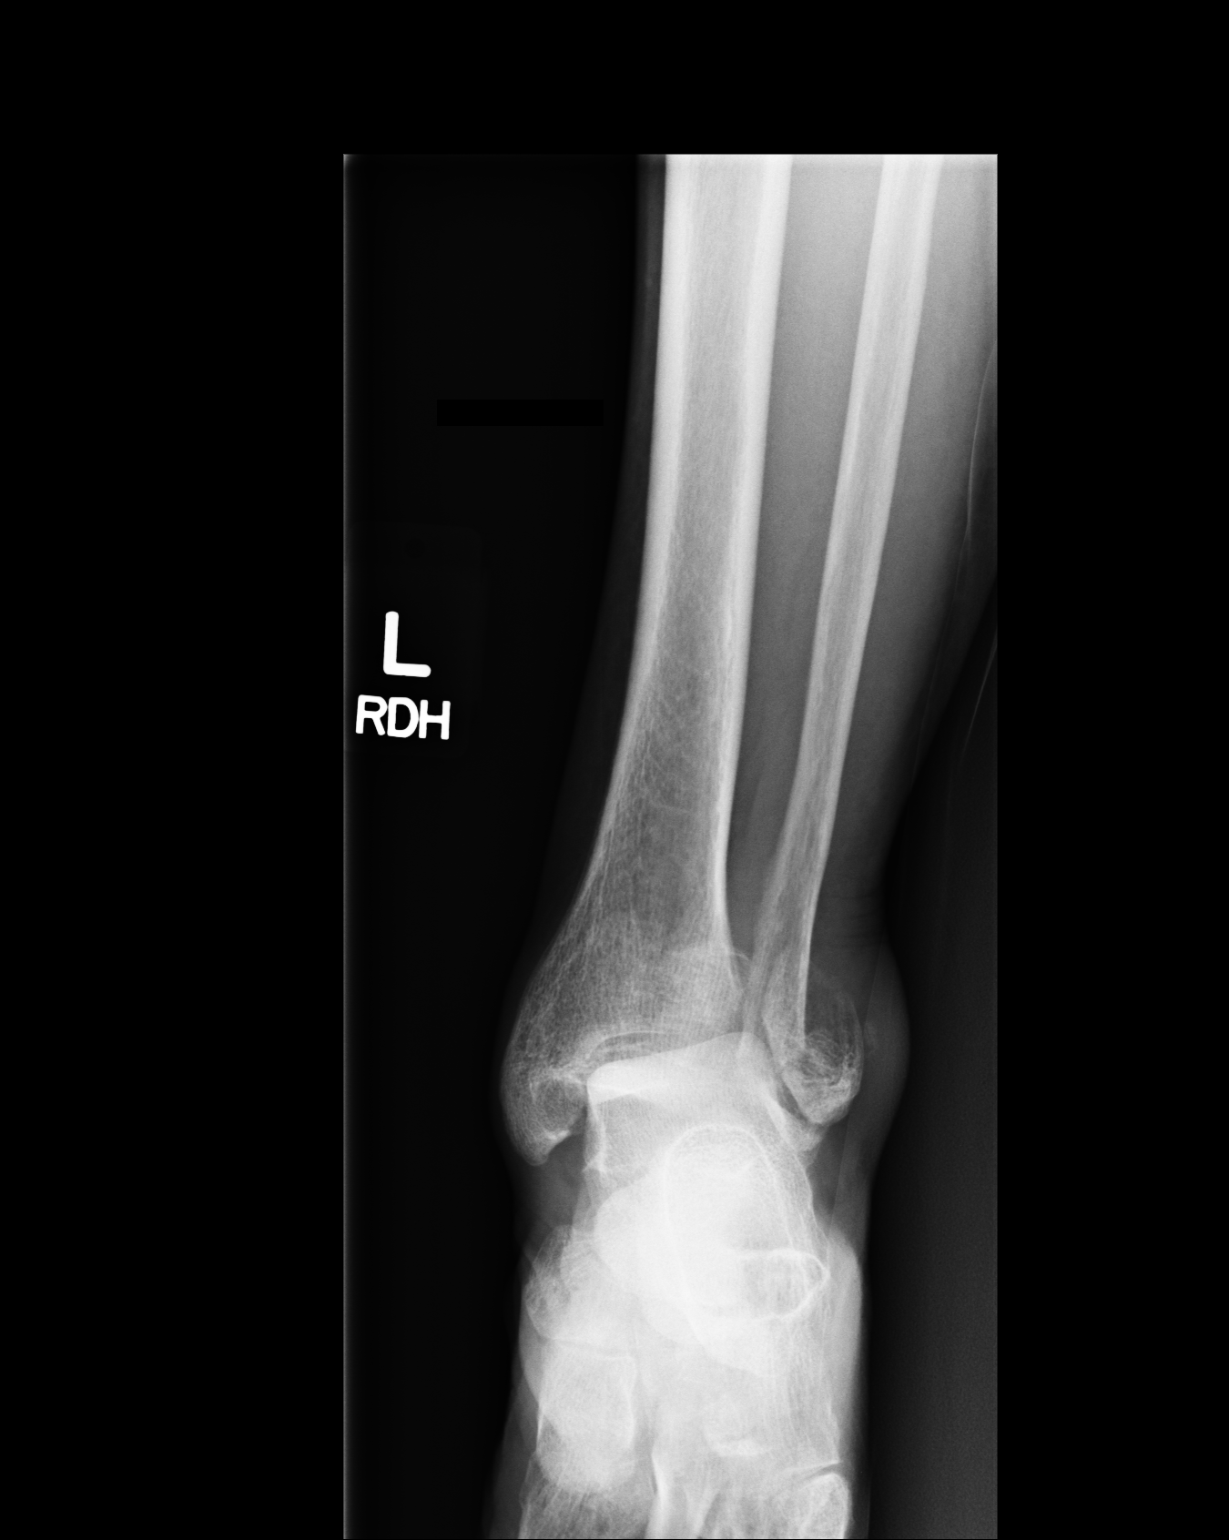
[im 2/2]
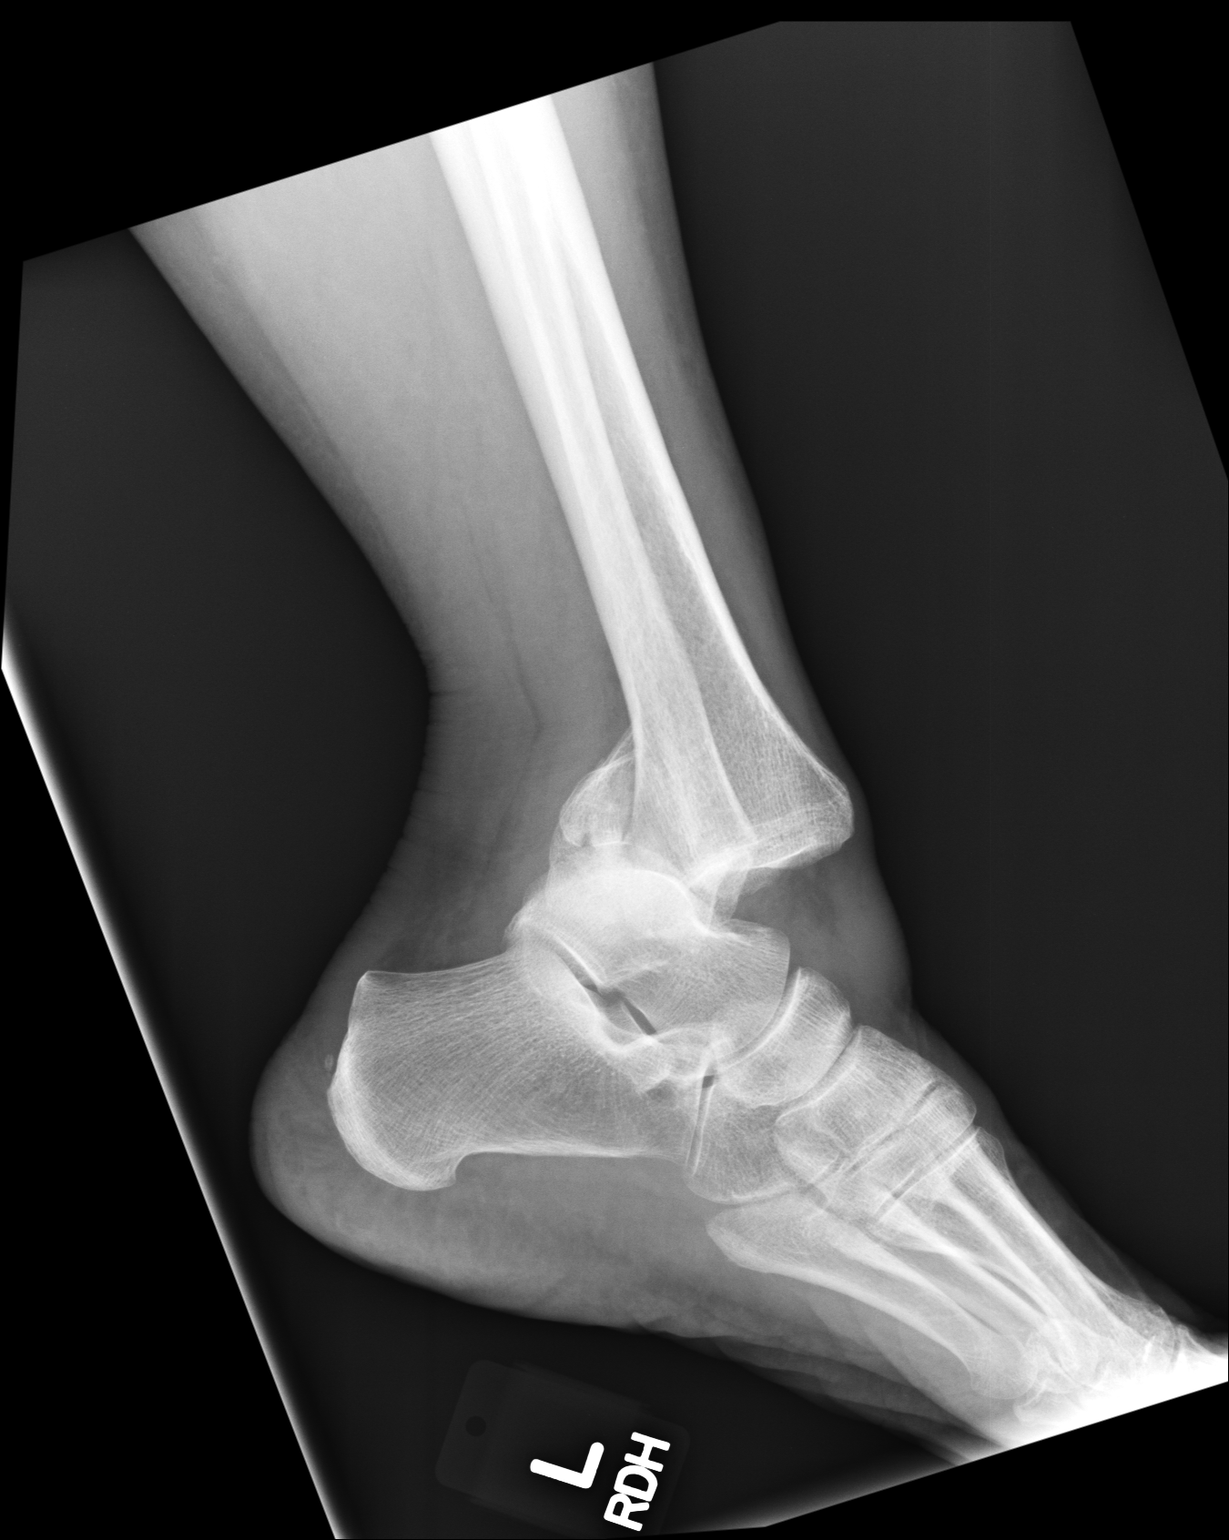

[2 of 2 positions shown; findings below may reference images not displayed]

PROCEDURE:     DXR - DXR ANKLE LEFT AP AND LATERAL  - April 16, 2011  [DATE]

RESULT:     The patient has sustained a displaced fracture of the lateral
malleolus. A posterior and medial malleolar fracture are suspected. There is
disruption of the ankle joint mortise and dislocation of the tibiotalar joint
IMPRESSION: There is dislocation of the tibiotalar joint. There is a
definite lateral and likely posterior and possible medial malleolar
fracture. CT scanning may be useful.

## 2013-05-21 DIAGNOSIS — M79609 Pain in unspecified limb: Secondary | ICD-10-CM | POA: Diagnosis not present

## 2013-05-27 DIAGNOSIS — D1800 Hemangioma unspecified site: Secondary | ICD-10-CM | POA: Diagnosis not present

## 2013-05-27 DIAGNOSIS — I789 Disease of capillaries, unspecified: Secondary | ICD-10-CM | POA: Diagnosis not present

## 2013-05-27 DIAGNOSIS — D239 Other benign neoplasm of skin, unspecified: Secondary | ICD-10-CM | POA: Diagnosis not present

## 2013-05-27 DIAGNOSIS — L578 Other skin changes due to chronic exposure to nonionizing radiation: Secondary | ICD-10-CM | POA: Diagnosis not present

## 2013-05-27 DIAGNOSIS — L819 Disorder of pigmentation, unspecified: Secondary | ICD-10-CM | POA: Diagnosis not present

## 2013-05-27 DIAGNOSIS — L821 Other seborrheic keratosis: Secondary | ICD-10-CM | POA: Diagnosis not present

## 2013-05-27 DIAGNOSIS — Z85828 Personal history of other malignant neoplasm of skin: Secondary | ICD-10-CM | POA: Diagnosis not present

## 2013-06-02 ENCOUNTER — Telehealth: Payer: Self-pay | Admitting: Internal Medicine

## 2013-06-02 NOTE — Telephone Encounter (Signed)
Pt was seen in September for her wellness.  States she received a bill that she is delinquent for 200.00+.  States she contacted her insurance company and they told her it is because the wrong code was used and they advised her to call our office to notify so we can resubmit.

## 2013-06-03 NOTE — Telephone Encounter (Signed)
I have sent an email to Darl Pikes to have her team look at the coding; however I noticed that in Dr. Melina Schools note it says she also discussed other issues at this Medicare Wellness visit.

## 2013-06-20 ENCOUNTER — Other Ambulatory Visit: Payer: Self-pay | Admitting: Internal Medicine

## 2013-07-21 NOTE — Telephone Encounter (Signed)
email that I received from Manuela Schwartz states patient doesn't have an outstanding balance that email was received on 06/06/13.

## 2013-07-21 NOTE — Telephone Encounter (Signed)
I called and spoke with patient and advised her of the information I received from [place].

## 2013-08-21 DIAGNOSIS — M2040 Other hammer toe(s) (acquired), unspecified foot: Secondary | ICD-10-CM | POA: Diagnosis not present

## 2013-08-21 DIAGNOSIS — M79609 Pain in unspecified limb: Secondary | ICD-10-CM | POA: Diagnosis not present

## 2013-09-03 DIAGNOSIS — H6120 Impacted cerumen, unspecified ear: Secondary | ICD-10-CM | POA: Diagnosis not present

## 2013-09-03 DIAGNOSIS — H93299 Other abnormal auditory perceptions, unspecified ear: Secondary | ICD-10-CM | POA: Diagnosis not present

## 2013-09-04 ENCOUNTER — Other Ambulatory Visit: Payer: Self-pay | Admitting: Internal Medicine

## 2013-09-04 NOTE — Telephone Encounter (Signed)
Refill

## 2013-09-25 DIAGNOSIS — M25549 Pain in joints of unspecified hand: Secondary | ICD-10-CM | POA: Diagnosis not present

## 2013-10-28 ENCOUNTER — Telehealth: Payer: Self-pay | Admitting: Internal Medicine

## 2013-10-28 DIAGNOSIS — Z1239 Encounter for other screening for malignant neoplasm of breast: Secondary | ICD-10-CM

## 2013-10-28 NOTE — Telephone Encounter (Signed)
Schedule mammogram, any day but Thursday.  [place].

## 2013-10-28 NOTE — Telephone Encounter (Signed)
Please advise 

## 2013-10-28 NOTE — Telephone Encounter (Signed)
ORDERED

## 2013-10-30 NOTE — Telephone Encounter (Signed)
Patient notified.,

## 2013-11-07 ENCOUNTER — Encounter: Payer: Self-pay | Admitting: *Deleted

## 2013-11-18 ENCOUNTER — Other Ambulatory Visit: Payer: Self-pay | Admitting: Internal Medicine

## 2013-11-18 NOTE — Telephone Encounter (Signed)
Appointment needed for future refills

## 2013-12-17 ENCOUNTER — Ambulatory Visit: Payer: Self-pay | Admitting: Internal Medicine

## 2013-12-17 DIAGNOSIS — Z1231 Encounter for screening mammogram for malignant neoplasm of breast: Secondary | ICD-10-CM | POA: Diagnosis not present

## 2013-12-17 LAB — HM MAMMOGRAPHY: HM Mammogram: NEGATIVE

## 2013-12-18 ENCOUNTER — Other Ambulatory Visit: Payer: Self-pay | Admitting: Internal Medicine

## 2013-12-18 ENCOUNTER — Encounter: Payer: Self-pay | Admitting: *Deleted

## 2014-03-03 DIAGNOSIS — M2040 Other hammer toe(s) (acquired), unspecified foot: Secondary | ICD-10-CM | POA: Diagnosis not present

## 2014-03-03 DIAGNOSIS — M79609 Pain in unspecified limb: Secondary | ICD-10-CM | POA: Diagnosis not present

## 2014-03-07 DIAGNOSIS — Z23 Encounter for immunization: Secondary | ICD-10-CM | POA: Diagnosis not present

## 2014-03-20 ENCOUNTER — Other Ambulatory Visit: Payer: Self-pay | Admitting: Internal Medicine

## 2014-03-20 DIAGNOSIS — M65842 Other synovitis and tenosynovitis, left hand: Secondary | ICD-10-CM | POA: Diagnosis not present

## 2014-03-23 ENCOUNTER — Ambulatory Visit (INDEPENDENT_AMBULATORY_CARE_PROVIDER_SITE_OTHER): Payer: Medicare Other | Admitting: Internal Medicine

## 2014-03-23 ENCOUNTER — Encounter: Payer: Self-pay | Admitting: Internal Medicine

## 2014-03-23 VITALS — BP 138/60 | HR 55 | Temp 97.9°F | Resp 14 | Ht 59.0 in | Wt 131.5 lb

## 2014-03-23 DIAGNOSIS — R9431 Abnormal electrocardiogram [ECG] [EKG]: Secondary | ICD-10-CM

## 2014-03-23 DIAGNOSIS — R002 Palpitations: Secondary | ICD-10-CM | POA: Diagnosis not present

## 2014-03-23 DIAGNOSIS — R0789 Other chest pain: Secondary | ICD-10-CM | POA: Diagnosis not present

## 2014-03-23 DIAGNOSIS — R000 Tachycardia, unspecified: Secondary | ICD-10-CM | POA: Diagnosis not present

## 2014-03-23 DIAGNOSIS — Z23 Encounter for immunization: Secondary | ICD-10-CM

## 2014-03-23 DIAGNOSIS — Z0000 Encounter for general adult medical examination without abnormal findings: Secondary | ICD-10-CM | POA: Diagnosis not present

## 2014-03-23 LAB — COMPREHENSIVE METABOLIC PANEL
ALBUMIN: 3.8 g/dL (ref 3.5–5.2)
ALT: 18 U/L (ref 0–35)
AST: 21 U/L (ref 0–37)
Alkaline Phosphatase: 54 U/L (ref 39–117)
BILIRUBIN TOTAL: 1 mg/dL (ref 0.2–1.2)
BUN: 22 mg/dL (ref 6–23)
CALCIUM: 10.1 mg/dL (ref 8.4–10.5)
CHLORIDE: 98 meq/L (ref 96–112)
CO2: 28 meq/L (ref 19–32)
Creatinine, Ser: 1 mg/dL (ref 0.4–1.2)
GFR: 58.82 mL/min — AB (ref >60.00)
GLUCOSE: 84 mg/dL (ref 70–99)
Potassium: 3.8 mEq/L (ref 3.5–5.1)
SODIUM: 136 meq/L (ref 135–145)
TOTAL PROTEIN: 7.2 g/dL (ref 6.0–8.3)

## 2014-03-23 LAB — CBC WITH DIFFERENTIAL/PLATELET
BASOS PCT: 0.5 % (ref 0.0–3.0)
Basophils Absolute: 0 10*3/uL (ref 0.0–0.1)
EOS PCT: 3 % (ref 0.0–5.0)
Eosinophils Absolute: 0.3 10*3/uL (ref 0.0–0.7)
HCT: 43 % (ref 36.0–46.0)
Hemoglobin: 14.4 g/dL (ref 12.0–15.0)
Lymphocytes Relative: 23 % (ref 12.0–46.0)
Lymphs Abs: 2.1 10*3/uL (ref 0.7–4.0)
MCHC: 33.4 g/dL (ref 30.0–36.0)
MCV: 94.1 fl (ref 78.0–100.0)
MONOS PCT: 10.4 % (ref 3.0–12.0)
Monocytes Absolute: 0.9 10*3/uL (ref 0.1–1.0)
NEUTROS PCT: 63.1 % (ref 43.0–77.0)
Neutro Abs: 5.7 10*3/uL (ref 1.4–7.7)
Platelets: 209 10*3/uL (ref 150.0–400.0)
RBC: 4.57 Mil/uL (ref 3.87–5.11)
RDW: 14.2 % (ref 11.5–15.5)
WBC: 9 10*3/uL (ref 4.0–10.5)

## 2014-03-23 LAB — MAGNESIUM: Magnesium: 1.6 mg/dL (ref 1.5–2.5)

## 2014-03-23 LAB — TSH: TSH: 3.3 u[IU]/mL (ref 0.35–4.50)

## 2014-03-23 NOTE — Progress Notes (Signed)
Patient ID: [name], female   DOB: 10-25-1922, 80 y.o.   MRN: [id number]  The patient is here for annual Medicare wellness examination and management of other chronic and acute problems.   1) "My  feet are shrinking. " Her shoe size has changed from a 6 to a 5.5 recently . She has a history of ankle fracture with rod placemement.   2) Chronic insomnia.  Trouble staying asleep ,  In bed by 10:30 and falls asleep but typically wakes up 1:30 without urge to void . Sleep hygiene /habits reviewed an no issues noted.   3) She has been having exertional dyspnea with one flight of stairs,  Notes tachycardia occurring with light yard work,  Prior cardiac workup last year by [facility] included a cath .  Has not had cardiology follow up in over a year.     The risk factors are reflected in the social history.  The roster of all physicians providing medical care to patient - is listed in the Snapshot section of the chart.  Activities of daily living:  The patient is 100% independent in all ADLs: dressing, toileting, feeding as well as independent mobility  Home safety : The patient has smoke detectors in the home. They wear seatbelts.  There are no firearms at home. There is no violence in the home.   There is no risks for hepatitis, STDs or HIV. There is no   history of blood transfusion. They have no travel history to infectious disease endemic areas of the world.  The patient has seen their dentist in the last six month. They have seen their eye doctor in the last year. They admit to slight hearing difficulty with regard to whispered voices and some television programs.  They have deferred audiologic testing in the last year.  They do not  have excessive sun exposure. Discussed the need for sun protection: hats, long sleeves and use of sunscreen if there is significant sun exposure.   Diet: the importance of a healthy diet is discussed. They do have a healthy diet.  The benefits of regular  aerobic exercise were discussed. She walks 4 times per week ,  20 minutes.   Depression screen: there are no signs or vegative symptoms of depression- irritability, change in appetite, anhedonia, sadness/tearfullness.  Cognitive assessment: the patient manages all their financial and personal affairs and is actively engaged. They could relate day,date,year and events; recalled 2/3 objects at 3 minutes; performed clock-face test normally.  The following portions of the patient's history were reviewed and updated as appropriate: allergies, current medications, past family history, past medical history,  past surgical history, past social history  and problem list.  Visual acuity was not assessed per patient preference since she has regular follow up with her ophthalmologist. Hearing and body mass index were assessed and reviewed.   During the course of the visit the patient was educated and counseled about appropriate screening and preventive services including : fall prevention , diabetes screening, nutrition counseling, colorectal cancer screening, and recommended immunizations.    Objective: BP 138/60  Pulse 55  Temp(Src) 97.9 F (36.6 C) (Oral)  Resp 14  Ht 4\' 11"  (1.499 m)  Wt 131 lb 8 oz (59.648 kg)  BMI 26.55 kg/m2  SpO2 97%   General appearance: alert, cooperative and appears stated age Head: Normocephalic, without obvious abnormality, atraumatic Eyes: conjunctivae/corneas clear. PERRL, EOM's intact. Fundi benign. Ears: normal TM's and external ear canals both ears Nose: Nares normal.  Septum midline. Mucosa normal. No drainage or sinus tenderness. Throat: lips, mucosa, and tongue normal; teeth and gums normal Neck: no adenopathy, no carotid bruit, no JVD, supple, symmetrical, trachea midline and thyroid not enlarged, symmetric, no tenderness/mass/nodules Lungs: clear to auscultation bilaterally Breasts: normal appearance, no masses or tenderness Heart: regular rate and rhythm,  S1, S2 normal, no murmur, click, rub or gallop Abdomen: soft, non-tender; bowel sounds normal; no masses,  no organomegaly Extremities: extremities normal, atraumatic, no cyanosis or edema Pulses: 2+ and symmetric Skin: Skin color, texture, turgor normal. No rashes or lesions Neurologic: Alert and oriented X 3, normal strength and tone. Normal symmetric reflexes. Normal coordination and gait.   Assessment and Plan:  Chest pain, atypical Her exertional dyspnea may ben an Anginal equivalent? HHer prior episodes  Of chest pain were investigated with with myoview, then cath (report in EPIC and findings above) in 20154 by Harwani .  The chest pain has not returned but she requested an EKG due to exertional dyspnea and tachycardia and there are  Inferior lead changes on EKG today in the office.  Referral to Dr Fletcher Anon for his opinion  On whether repeat assessment is needed.   Routine general medical examination at a health care facility Annual comprehensive exam was done including breast, excluding pelvic and PAP smear. All screenings have been addressed and a printed health maintenance schedule was given to patient.    Palpitations reported by patient with exertion.  EKG done today suggests ischemia.  Referral to cardiology,  Lab Results  Component Value Date   TSH 3.30 03/23/2014   Lab Results  Component Value Date   NA 136 03/23/2014   K 3.8 03/23/2014   CL 98 03/23/2014   CO2 28 03/23/2014   Lab Results  Component Value Date   CREATININE 1.0 03/23/2014      Updated Medication List Outpatient Encounter Prescriptions as of 03/23/2014  Medication Sig  . amLODipine (NORVASC) 5 MG tablet TAKE ONE TABLET BY MOUTH TWICE DAILY  . aspirin 81 MG tablet Take 81 mg by mouth daily.    . Calcium Carbonate-Vitamin D (CALCIUM + D) 600-200 MG-UNIT TABS Take by mouth.    . captopril-hydrochlorothiazide (CAPOZIDE) 25-15 MG per tablet TAKE 1 TABLET BY MOUTH TWICE A DAY  . fish oil-omega-3 fatty acids 1000  MG capsule Take by mouth daily.    . isosorbide mononitrate (IMDUR) 30 MG 24 hr tablet Take 30 mg by mouth daily.  . Multiple Vitamin (MULTIVITAMIN) tablet Take 1 tablet by mouth daily.    . niacin 500 MG tablet Take 500 mg by mouth daily with breakfast.    . TDaP (BOOSTRIX) 5-2.5-18.5 LF-MCG/0.5 injection Inject 0.5 mLs into the muscle once.  Marland Kitchen amoxicillin (AMOXIL) 500 MG capsule TAKE 4 CAPSULES ONE HOUR PRIOR TO DENTALPROCEDURE  . diphenhydrAMINE (BENADRYL) 25 mg capsule Take 1 capsule (25 mg total) by mouth every 6 (six) hours as needed for itching.  . famotidine (PEPCID) 20 MG tablet Take 20 mg by mouth 2 (two) times daily.  . pravastatin (PRAVACHOL) 20 MG tablet Take 20 mg by mouth daily.  Marland Kitchen triamcinolone cream (KENALOG) 0.1 % Apply topically 2 (two) times daily.

## 2014-03-23 NOTE — Progress Notes (Signed)
Pre-visit discussion using our clinic review tool. No additional management support is needed unless otherwise documented below in the visit note.  

## 2014-03-23 NOTE — Patient Instructions (Addendum)
You had your annual  wellness exam today.    We will schedule your mammogram next July .   You received the Prevnar vaccine today.  If you develop a sore arm , use tylenol and ibuprofen for a few days   We will contact you with the bloodwork results  Your EKG has some changes compared to last year's EKg.  I am referring you to Dr Fletcher Anon for evaluation of your heart. The office will call you with the time and date

## 2014-03-24 NOTE — Assessment & Plan Note (Signed)
Annual comprehensive exam was done including breast, excluding pelvic and PAP smear. All screenings have been addressed and a printed health maintenance schedule was given to patient.   

## 2014-03-24 NOTE — Assessment & Plan Note (Signed)
reported by patient with exertion.  EKG done today suggests ischemia.  Referral to cardiology,  Lab Results  Component Value Date   TSH 3.30 03/23/2014   Lab Results  Component Value Date   NA 136 03/23/2014   K 3.8 03/23/2014   CL 98 03/23/2014   CO2 28 03/23/2014   Lab Results  Component Value Date   CREATININE 1.0 03/23/2014

## 2014-03-24 NOTE — Assessment & Plan Note (Addendum)
Her exertional dyspnea may ben an Anginal equivalent? HHer prior episodes  Of chest pain were investigated with with myoview, then cath (report in EPIC and findings above) in 20154 by Harwani .  The chest pain has not returned but she requested an EKG due to exertional dyspnea and tachycardia and there are  Inferior lead changes on EKG today in the office.  Referral to Dr Fletcher Anon for his opinion  On whether repeat assessment is needed.

## 2014-03-25 ENCOUNTER — Encounter: Payer: Self-pay | Admitting: *Deleted

## 2014-04-07 ENCOUNTER — Encounter: Payer: Self-pay | Admitting: Cardiovascular Disease

## 2014-04-07 ENCOUNTER — Ambulatory Visit (INDEPENDENT_AMBULATORY_CARE_PROVIDER_SITE_OTHER): Payer: Medicare Other | Admitting: Cardiovascular Disease

## 2014-04-07 VITALS — BP 128/70 | HR 68 | Ht 61.0 in | Wt 132.0 lb

## 2014-04-07 DIAGNOSIS — R0602 Shortness of breath: Secondary | ICD-10-CM

## 2014-04-07 DIAGNOSIS — R002 Palpitations: Secondary | ICD-10-CM

## 2014-04-07 DIAGNOSIS — I10 Essential (primary) hypertension: Secondary | ICD-10-CM

## 2014-04-07 DIAGNOSIS — R0609 Other forms of dyspnea: Secondary | ICD-10-CM | POA: Diagnosis not present

## 2014-04-07 NOTE — Assessment & Plan Note (Signed)
The patient reports no significant worsening over the last year. She has no associated chest pain. I reviewed cardiac catheterization from last year which showed mild nonobstructive coronary artery disease. I doubt progression of coronary artery disease this quickly .  EKG from a few weeks ago showed nonspecific inferior T wave changes. Interestingly, the EKG from today shows left bundle branch block. I suspect that this represents conduction disease per se and not necessarily underlying obstructive coronary artery disease. I requested an echocardiogram to evaluate LV systolic function. No further ischemic workup is recommended at that echo shows normal ejection fraction and wall motion.

## 2014-04-07 NOTE — Assessment & Plan Note (Signed)
These are overall very mild. If echocardiogram does not show significant structural abnormalities, no further evaluation is recommended.

## 2014-04-07 NOTE — Assessment & Plan Note (Signed)
Blood pressure is well controlled on current medications. 

## 2014-04-07 NOTE — Patient Instructions (Signed)
Your physician has requested that you have an echocardiogram. Echocardiography is a painless test that uses sound waves to create images of your heart. It provides your doctor with information about the size and shape of your heart and how well your heart's chambers and valves are working. This procedure takes approximately one hour. There are no restrictions for this procedure.  Follow up as needed 

## 2014-04-07 NOTE — Progress Notes (Signed)
Primary care physician: Dr. Derrel Nip  HPI  This is an 80 year old female who was referred for evaluation for exertional dyspnea. She has known history of hypertension and hyperlipidemia. She was evaluated in May of 2014 by Dr. Terrence Dupont for chest pain. She underwent cardiac catheterization which showed mild nonobstructive coronary artery disease involving the LAD and left circumflex. Ejection fraction was normal. I reviewed these images personally today. She reports prolonged history of exertional dyspnea which has not worsened recently. No orthopnea, PND or lower extremity edema. She does not have any lung disease. She was noted recently on routine EKG to have nonspecific inferior T wave changes. She is noted today to have a left bundle branch block.  Allergies  Allergen Reactions  . Sulfa Antibiotics Hives and Swelling  . Contrast Media [Iodinated Diagnostic Agents] Rash and Other (See Comments)    Swelling of the body.     Current Outpatient Prescriptions on File Prior to Visit  Medication Sig Dispense Refill  . amLODipine (NORVASC) 5 MG tablet TAKE ONE TABLET BY MOUTH TWICE DAILY  60 tablet  0  . amoxicillin (AMOXIL) 500 MG capsule TAKE 4 CAPSULES ONE HOUR PRIOR TO DENTALPROCEDURE  4 capsule  0  . aspirin 81 MG tablet Take 81 mg by mouth daily.        . Calcium Carbonate-Vitamin D (CALCIUM + D) 600-200 MG-UNIT TABS Take by mouth.        . captopril-hydrochlorothiazide (CAPOZIDE) 25-15 MG per tablet TAKE 1 TABLET BY MOUTH TWICE A DAY  60 tablet  3  . diphenhydrAMINE (BENADRYL) 25 mg capsule Take 1 capsule (25 mg total) by mouth every 6 (six) hours as needed for itching.  30 capsule  0  . famotidine (PEPCID) 20 MG tablet Take 20 mg by mouth 2 (two) times daily.      . fish oil-omega-3 fatty acids 1000 MG capsule Take by mouth daily.        . isosorbide mononitrate (IMDUR) 30 MG 24 hr tablet Take 30 mg by mouth daily.      . Multiple Vitamin (MULTIVITAMIN) tablet Take 1 tablet by mouth daily.         . niacin 500 MG tablet Take 500 mg by mouth daily with breakfast.        . pravastatin (PRAVACHOL) 20 MG tablet Take 20 mg by mouth daily.      . TDaP (BOOSTRIX) 5-2.5-18.5 LF-MCG/0.5 injection Inject 0.5 mLs into the muscle once.  0.5 mL  0  . triamcinolone cream (KENALOG) 0.1 % Apply topically 2 (two) times daily.  30 g  0   No current facility-administered medications on file prior to visit.     Past Medical History  Diagnosis Date  . Cancer     basal cell Ca,  treated y Dr. Lacinda Axon with Mohs procedure  . Hypertension   . Hyperlipidemia      Past Surgical History  Procedure Laterality Date  . Left ankle  Octo 2012    s/p pinning , Krazinski  . Abdominal hysterectomy    . Ankle      rods in both ankles  . Cardiac catheterization      [facility]     Family History  Problem Relation Age of Onset  . Hypertension Mother   . Heart attack Mother   . Hypertension Father   . Heart attack Brother      History   Social History  . Marital Status: Married    Spouse Name: N/A  Number of Children: N/A  . Years of Education: N/A   Occupational History  . Not on file.   Social History Main Topics  . Smoking status: Never Smoker   . Smokeless tobacco: Never Used  . Alcohol Use: No  . Drug Use: No  . Sexual Activity: Not on file   Other Topics Concern  . Not on file   Social History Narrative  . No narrative on file     ROS   PHYSICAL EXAM   BP 128/70  Pulse 68  Ht 5\' 1"  (1.549 m)  Wt 132 lb (59.875 kg)  BMI 24.95 kg/m2 Constitutional: She is oriented to person, place, and time. She appears well-developed and well-nourished. No distress.  HENT: No nasal discharge.  Head: Normocephalic and atraumatic.  Eyes: Pupils are equal and round. No discharge.  Neck: Normal range of motion. Neck supple. No JVD present. No thyromegaly present.  Cardiovascular: Normal rate, regular rhythm, normal heart sounds. Exam reveals no gallop and no friction rub. No murmur  heard.  Pulmonary/Chest: Effort normal and breath sounds normal. No stridor. No respiratory distress. She has no wheezes. She has no rales. She exhibits no tenderness.  Abdominal: Soft. Bowel sounds are normal. She exhibits no distension. There is no tenderness. There is no rebound and no guarding.  Musculoskeletal: Normal range of motion. She exhibits no edema and no tenderness.  Neurological: She is alert and oriented to person, place, and time. Coordination normal.  Skin: Skin is warm and dry. No rash noted. She is not diaphoretic. No erythema. No pallor.  Psychiatric: She has a normal mood and affect. Her behavior is normal. Judgment and thought content normal.     CXK:GYJEH  Rhythm  -Left bundle branch block.   ABNORMAL     ASSESSMENT AND PLAN

## 2014-04-18 ENCOUNTER — Other Ambulatory Visit: Payer: Self-pay | Admitting: Internal Medicine

## 2014-04-24 ENCOUNTER — Other Ambulatory Visit (INDEPENDENT_AMBULATORY_CARE_PROVIDER_SITE_OTHER): Payer: Medicare Other

## 2014-04-24 ENCOUNTER — Other Ambulatory Visit: Payer: Self-pay

## 2014-04-24 DIAGNOSIS — R0602 Shortness of breath: Secondary | ICD-10-CM | POA: Diagnosis not present

## 2014-04-24 DIAGNOSIS — R002 Palpitations: Secondary | ICD-10-CM

## 2014-05-28 ENCOUNTER — Encounter (HOSPITAL_COMMUNITY): Payer: Self-pay | Admitting: Cardiology

## 2014-06-02 ENCOUNTER — Ambulatory Visit (INDEPENDENT_AMBULATORY_CARE_PROVIDER_SITE_OTHER): Payer: Medicare Other | Admitting: Cardiovascular Disease

## 2014-06-02 ENCOUNTER — Encounter: Payer: Self-pay | Admitting: Cardiovascular Disease

## 2014-06-02 VITALS — BP 118/60 | HR 64 | Ht 61.0 in | Wt 134.0 lb

## 2014-06-02 DIAGNOSIS — I429 Cardiomyopathy, unspecified: Secondary | ICD-10-CM

## 2014-06-02 DIAGNOSIS — I428 Other cardiomyopathies: Secondary | ICD-10-CM

## 2014-06-02 DIAGNOSIS — I10 Essential (primary) hypertension: Secondary | ICD-10-CM

## 2014-06-02 NOTE — Patient Instructions (Signed)
Continue same medications.   Your physician wants you to follow-up in: 6 months.  You will receive a reminder letter in the mail two months in advance. If you don't receive a letter, please call our office to schedule the follow-up appointment.  

## 2014-06-02 NOTE — Progress Notes (Signed)
Primary care physician: Dr. Derrel Nip  HPI  This is an 80 year old female who is here today for a follow up visit regarding exertional dyspnea. She has known history of hypertension and hyperlipidemia. She was evaluated in May of 2014 by Dr. Terrence Dupont for chest pain. She underwent cardiac catheterization which showed mild nonobstructive coronary artery disease involving the LAD and left circumflex. Ejection fraction was normal.  She was seen recently for chronic exertional dyspnea. She is noted today to have a left bundle branch block. An echocardiogram was done which showed an ejection fraction of 45-50%, mild-to-moderate aortic regurgitation, mild mitral regurgitation and mild-to-moderate tricuspid regurgitation. Pulmonary pressure was normal. She reports no significant change in her symptoms. No orthopnea, PND or lower extremity edema.  Allergies  Allergen Reactions  . Sulfa Antibiotics Hives and Swelling  . Contrast Media [Iodinated Diagnostic Agents] Rash and Other (See Comments)    Swelling of the body.     Current Outpatient Prescriptions on File Prior to Visit  Medication Sig Dispense Refill  . amoxicillin (AMOXIL) 500 MG capsule TAKE 4 CAPSULES ONE HOUR PRIOR TO DENTALPROCEDURE 4 capsule 0  . aspirin 81 MG tablet Take 81 mg by mouth daily.      . Calcium Carbonate-Vitamin D (CALCIUM + D) 600-200 MG-UNIT TABS Take by mouth.      . captopril-hydrochlorothiazide (CAPOZIDE) 25-15 MG per tablet TAKE 1 TABLET BY MOUTH TWICE A DAY 60 tablet 1  . diphenhydrAMINE (BENADRYL) 25 mg capsule Take 1 capsule (25 mg total) by mouth every 6 (six) hours as needed for itching. 30 capsule 0  . fish oil-omega-3 fatty acids 1000 MG capsule Take by mouth daily.      . Multiple Vitamin (MULTIVITAMIN) tablet Take 1 tablet by mouth daily.      . niacin 500 MG tablet Take 500 mg by mouth daily with breakfast.      . TDaP (BOOSTRIX) 5-2.5-18.5 LF-MCG/0.5 injection Inject 0.5 mLs into the muscle once. 0.5 mL 0  .  triamcinolone cream (KENALOG) 0.1 % Apply topically 2 (two) times daily. (Patient taking differently: Apply topically as needed. ) 30 g 0   No current facility-administered medications on file prior to visit.     Past Medical History  Diagnosis Date  . Cancer     basal cell Ca,  treated y Dr. Lacinda Axon with Mohs procedure  . Hypertension   . Hyperlipidemia      Past Surgical History  Procedure Laterality Date  . Left ankle  Octo 2012    s/p pinning , Krazinski  . Abdominal hysterectomy    . Ankle      rods in both ankles  . Cardiac catheterization      [facility]  . Left heart catheterization with coronary angiogram N/A 10/31/2012    Procedure: LEFT HEART CATHETERIZATION WITH CORONARY ANGIOGRAM;  Surgeon: Clent Demark, MD;  Location: [facility] CATH LAB;  Service: Cardiovascular;  Laterality: N/A;     Family History  Problem Relation Age of Onset  . Hypertension Mother   . Heart attack Mother   . Hypertension Father   . Heart attack Brother      History   Social History  . Marital Status: Married    Spouse Name: N/A    Number of Children: N/A  . Years of Education: N/A   Occupational History  . Not on file.   Social History Main Topics  . Smoking status: Never Smoker   . Smokeless tobacco: Never Used  . Alcohol  Use: No  . Drug Use: No  . Sexual Activity: Not on file   Other Topics Concern  . Not on file   Social History Narrative     ROS   PHYSICAL EXAM   BP 118/60 mmHg  Pulse 64  Ht 5\' 1"  (1.549 m)  Wt 134 lb (60.782 kg)  BMI 25.33 kg/m2 Constitutional: She is oriented to person, place, and time. She appears well-developed and well-nourished. No distress.  HENT: No nasal discharge.  Head: Normocephalic and atraumatic.  Eyes: Pupils are equal and round. No discharge.  Neck: Normal range of motion. Neck supple. No JVD present. No thyromegaly present.  Cardiovascular: Normal rate, regular rhythm, normal heart sounds. Exam reveals no gallop and no friction  rub. No murmur heard.  Pulmonary/Chest: Effort normal and breath sounds normal. No stridor. No respiratory distress. She has no wheezes. She has no rales. She exhibits no tenderness.  Abdominal: Soft. Bowel sounds are normal. She exhibits no distension. There is no tenderness. There is no rebound and no guarding.  Musculoskeletal: Normal range of motion. She exhibits no edema and no tenderness.  Neurological: She is alert and oriented to person, place, and time. Coordination normal.  Skin: Skin is warm and dry. No rash noted. She is not diaphoretic. No erythema. No pallor.  Psychiatric: She has a normal mood and affect. Her behavior is normal. Judgment and thought content normal.      ASSESSMENT AND PLAN

## 2014-06-03 DIAGNOSIS — I428 Other cardiomyopathies: Secondary | ICD-10-CM | POA: Insufficient documentation

## 2014-06-03 NOTE — Assessment & Plan Note (Signed)
The patient has mild nonischemic cardiomyopathy with ejection fraction of 45-50% with underlying left bundle branch block. There is no evidence of fluid overload. She is already on Toprol and captopril. Blood pressure is optimally controlled. Continue same medications.

## 2014-06-03 NOTE — Assessment & Plan Note (Signed)
Blood pressure is well controlled 

## 2014-06-18 ENCOUNTER — Other Ambulatory Visit: Payer: Self-pay | Admitting: Internal Medicine

## 2014-07-16 ENCOUNTER — Other Ambulatory Visit: Payer: Self-pay | Admitting: Internal Medicine

## 2014-07-16 NOTE — Telephone Encounter (Signed)
Spoke with pt she is having a dental procedure on 3.3.16.  Please advise refill

## 2014-07-17 NOTE — Telephone Encounter (Signed)
Antibiotics are no longer recommended prior to teeth cleanings unless patient has an artificial heart valve or an artificial joint, due to the increased risk of a serious bowel infection called clostridium dificile colitis that occurs from the imbalance of bacteria caused by use of antibiotics.  I have reviewed patient's chart and find no history of either. PLEASE CONFIRM with patient that there is no history of valve or joint replacement.

## 2014-07-17 NOTE — Telephone Encounter (Signed)
Left message for pt to return my call.

## 2014-07-20 MED ORDER — LISINOPRIL-HYDROCHLOROTHIAZIDE 20-25 MG PO TABS: 1.0000 | ORAL_TABLET | Freq: Every day | ORAL | Status: DC

## 2014-07-20 NOTE — Telephone Encounter (Signed)
Spoke to patient, denies artificial heart valve or joint, states she has bars and screws in an ankle post fracture, but no artificial joints. Pt states the dental school always asks her if she has had her amoxicllin. Notified of Dr. Lupita Dawn recommendations, but still wanted note sent to see if still needed.  Pt also states Captopril Rx increased from $12 to $47, asking if she can taper off that due to costs?

## 2014-07-20 NOTE — Telephone Encounter (Signed)
Pt notified and verbalized understanding.

## 2014-07-20 NOTE — Telephone Encounter (Signed)
No, if she does  Not have a history  of endocarditis or of an artificial heart valve,, antibiotics  are no longer recommeded prior to dental procedures by the [employer] [facility]   I can change the captopril to a cheaper medication and will send to pharmacy

## 2014-08-14 DIAGNOSIS — M2041 Other hammer toe(s) (acquired), right foot: Secondary | ICD-10-CM | POA: Diagnosis not present

## 2014-10-09 NOTE — H&P (Signed)
PATIENT NAMEGERYL, [name] MR#:  [id number] DATE OF BIRTH:  1922/12/13  DATE OF ADMISSION:  09/14/2012  PRIMARY CARE PHYSICIAN:  Deborra Medina, MD  CHIEF COMPLAINT: Chest tightness.   HISTORY OF PRESENTING ILLNESS: An 81 year old female patient with history of hypertension, no prior coronary artery disease who presents to the Emergency Room complaining of chest tightness lasting 15 to 20 minutes, in the central chest lasting 15 to 20 minutes with no radiation, no aggravating or relieving factors. The pain is not exertional. Never had similar chest pain in the past. No prior coronary artery disease or stress test. The patient did have EKG changes showing left bundle branch block on the initial EKG in the Emergency Room which reversed to nonspecific changes with normal sinus rhythm in 40 minutes. Cardiac enzymes are normal. Initially, case was discussed with Dr. Nehemiah Massed but the ER physician who suggested admission.   The patient does not have any shortness of breath, palpitations, nausea, vomiting, abdominal pain.   PAST MEDICAL HISTORY:  Hypertension.   PAST SURGICAL HISTORY: Bimalleolar fracture of the ankle on the left and surgery.   FAMILY HISTORY: Coronary artery disease in mother and brother but no premature coronary artery disease.   ALLERGIES: SULFA AND CONTRAST.  CONTRAST CAUSES ANAPHYLACTIC REACTION.  SOCIAL HISTORY: The patient does not smoke. No alcohol. No illicit drugs. Lives at home with her husband. Walks on her own.   REVIEW OF SYSTEMS:   CONSTITUTIONAL: No fever, fatigue, weakness.   EYES: No blurred vision, pain, redness, tinnitus, ear pain, hearing loss.  RESPIRATORY: No cough, wheeze, hemoptysis.  CARDIOVASCULAR: Has chest tightness, no orthopnea, edema.  GASTROINTESTINAL: No nausea, vomiting, diarrhea, abdominal pain.  GENITOURINARY: No dysuria, hematuria, frequency.  ENDOCRINE: No polyuria, nocturia, thyroid problems.  INTEGUMENTARY: No acne, rash, lesions.   MUSCULOSKELETAL: Had some pain in left ankle on and off. No back pain. No arthritis.  NEUROLOGIC: No focal numbness, weakness, dysarthria.  PSYCHIATRIC: No anxiety or depression.   HOME MEDICATIONS:   1.  Aspirin 35 mg oral once a day. 2.  Colace 100 mg oral 2 times a day.  3.  Fish 1 capsule oral once a day. 4.  Neurontin 300 mg oral once a day.  5.  Captopril once a day, unknown dose.   PHYSICAL EXAMINATION: VITAL SIGNS: Temperature 98.3, pulse of 82, respirations 20, blood pressure 185/77 and saturating 98% on room air, afebrile. GENERAL:  Obese, Caucasian female patient lying in bed restless with mild chest discomfort.  PSYCHIATRIC: Alert, oriented x 3. Mood and affect appropriate. Judgment intact.  HEENT: Atraumatic, normocephalic. Oral mucosa moist and pink. External ears and nose normal. No pallor. No icterus. Pupils bilaterally equal and reactive to light.  NECK: Supple. No thyromegaly. No palpable lymph nodes. Trachea midline. No carotid bruit, JVD.  CARDIOVASCULAR: S1, S2, no murmurs. Peripheral pulses 2+. No edema. No chest tenderness.  RESPIRATORY: Normal work of breathing. Clear to auscultation on both sides.  GASTROINTESTINAL: Soft abdomen, nontender. Bowel sounds present. No hepatosplenomegaly palpable.  SKIN: Warm and dry. No petechiae, rash, ulcers.  GENITOURINARY: No CVA tenderness or bladder distention.  MUSCULOSKELETAL: No joint swelling, redness, effusion of the large joints. Normal muscle tone.  NEUROLOGICAL: Motor strength 5/5 in upper and lower extremities. Sensation much intact all over.  LYMPHATIC: No cervical lymphadenopathy.   LABORATORY AND DIAGNOSTIC DATA:  Labs show creatinine 0.79, sodium 135, potassium 3.5. Troponin less than 0.02, CK 98 and CK-MB 2.4. WBC 7.5, hemoglobin 14.8.  Initial EKG  showed left bundle branch block, which has resolved. She does have left ventricular hypertrophy on the repeat EKG with no strain pattern. No acute ST elevation. Chest  x-ray, portable, showed no acute cardiopulmonary disease.   ASSESSMENT AND PLAN: 1.  Chest tightness with EKG changes in a patient with hypertension. The patient did have a left bundle branch block on the initial EKG which is resolved.  [name] have discussed the case with Dr. Nehemiah Massed who had suggested admitting the patient and getting 2 more sets of cardiac enzymes. He will see the patient and decide between a stress test and catheterization. The patient did have severe allergy to prior contrast but might need prep with steroids and Benadryl in case she absolutely needs a catheterization.  2.  Hypertension, elevated.  We will continue the Captopril and start on metoprolol.  3.  Hyperlipidemia. The patient is on fish oil and niacin. 4.  Deep venous thrombosis prophylaxis with Lovenox.  5.  CODE STATUS:  Full code.  Time spent on this case: Was 45 minutes.    ____________________________ Jacqueline Alf Madylyn Insco, MD srs:ct D: 09/14/2012 18:12:49 ET T: 09/14/2012 18:29:16 ET JOB#: [id number]  cc: Alveta Heimlich R. Maicy Filip, MD, <Dictator> Deborra Medina, MD Corey Skains, MD  Neita Carp MD ELECTRONICALLY SIGNED 09/23/2012 15:06

## 2014-10-09 NOTE — Discharge Summary (Signed)
PATIENT NAMEDEVON, [name] MR#:  [id number] DATE OF BIRTH:  1923/03/29  DATE OF ADMISSION:  09/14/2012 DATE OF DISCHARGE:  09/16/2012  ADMITTING DIAGNOSIS: Chest pressure.   DISCHARGE DIAGNOSES: 1.  Chest pressure, felt to be noncardiac, status post exercise 2.  Hypertension.  3.  Hyperlipidemia.  4.  History of bimalleolar fracture of the ankle on the left, status post surgery.   PERTINENT LABS AND EVALUATIONS: Troponins x 3 less than 0.02.   WBC 7.5, hemoglobin 14.8, platelet count was 211.   BMP: Glucose 149, BUN 24, creatinine 0.79, sodium 135, potassium 3.4, chloride 102, CO2 was 28, LDL was 144, cholesterol total was 226, triglycerides 109, HDL was 60.   Exercise stress test: There was no chest pain with stress test. Myocardial perfusion scan showed no evidence of ischemia.   HOSPITAL COURSE: Please refer to H and P done by the admitting physician. The patient is a pleasant 81 year old white female who usually walks about 2 miles a day, presented with complaint of chest pressure, ongoing, for the past few days with exertion. The patient was admitted and had an EKG and cardiac enzymes. She was noted to have an episode of left bundle branch block on presentation. Due to her symptoms, she was admitted and had serial cardiac enzymes, and a cardiology evaluation was done.   Plan was to discharge her because cardiology felt that, with her age and SEVERE CONTRAST DYE ALLERGY they would do conservative approach. However, the patient had some dizziness with ambulation on Sunday. Therefore she was kept in the hospital and underwent a stress test on Monday. Her stress test was negative, as above. She has been cleared by cardiology for discharge, with outpatient followup with cardiology as needed.   DISCHARGE MEDICATIONS: Aspirin 81 mg 1 tablet p.o. daily, captopril 25, 1 tablet p.o. b.[name].d., fish oil 1 tablet p.o. b.[name].d. as taken previously, multivitamin 1 tablet p.o. daily, niacin 500, 1 tablet  p.o. daily, calcium plus vitamin D, 1 tablet  p.o. daily, Tylenol 650 mg q. 4 p.r.n. for pain, nitroglycerin 0.4 mg sublingual p.r.n. for chest pain, gabapentin 300 daily.   DIET: Low-sodium, low-fat, low-cholesterol.   ACTIVITY: As tolerated.   Follow up with Dr. Nehemiah Massed as needed.   Follow up with primary M.D. in 1 to 2 weeks.   Note that 32 minutes was spent on this discharge.    ____________________________ Lafonda Mosses. Posey Pronto, MD shp:dm D: 09/17/2012 08:24:00 ET T: 09/17/2012 08:56:14 ET JOB#: [id number]  cc: Teon Hudnall H. Posey Pronto, MD, <Dictator> Alric Seton MD ELECTRONICALLY SIGNED 09/22/2012 19:31

## 2014-10-09 NOTE — Consult Note (Signed)
PATIENT NAMEMARGARETE, [name] MR#:  [id number] DATE OF BIRTH:  1923/05/01  DATE OF CONSULTATION:  09/15/2012  CONSULTING PHYSICIAN:  Dr. Jasmine December.  REASON FOR CONSULTATION: Chest pain, with abnormal EKG and left bundle branch block.   CHIEF COMPLAINT: "[name] have chest pain."   HISTORY OF PRESENT ILLNESS: This is an 81 year old female with known hypertension,  hyperlipidemia, who has had an INTOLERANCE TO STATIN USE. The patient has done lots of physical activity on a daily basis, walking 2 miles per day. The patient has had episodes of chest pressure and pain with physical activity increasing in nature over the last several weeks to a significant degree where she had to be seen in the Emergency Room. At that time, she had a left bundle branch block which resolved with nitroglycerin and other medication management. The patient now has much improved, with a normal troponin, CK-MB, without evidence of myocardial infarction. The patient feels quite well. The remainder of review of systems negative for vision change, ringing in the ears, hearing loss, cough, congestion, heartburn, nausea, vomiting, diarrhea, bloody stools, stomach pain, extremity pain, leg weakness, cramping of the buttocks, known blood clots, headaches, blackouts, dizzy spells, nosebleeds, congestion, trouble swallowing, frequent urination, urination at night, muscle weakness, numbness, anxiety, depression, skin lesions, or skin rashes.   PAST MEDICAL HISTORY: 1.  Hypertension.  2.  Hyperlipidemia.   FAMILY HISTORY: No family members with early onset of cardiovascular disease or hypertension.   SOCIAL HISTORY: Currently denies alcohol or tobacco use.   ALLERGIES: AS LISTED.   MEDICATIONS: As listed.   PHYSICAL EXAMINATION: VITAL SIGNS: Blood pressure is 110/62 bilaterally, heart rate 72 upright, reclining, and regular.  GENERAL: She is a well-appearing female in no acute distress.  HEENT: No icterus, thyromegaly, ulcers,  hemorrhage, or xanthelasma.  CARDIOVASCULAR: Regular rate and rhythm, with normal S1, S2, without murmur, gallop, or rub. PMI is normal size and placement. Carotid upstroke normal, without bruit. Jugular venous pressure is normal.  LUNGS: Lungs are clear to auscultation, with normal respirations.  ABDOMEN: Soft, nontender, without hepatosplenomegaly or masses. Abdominal aorta is normal size, without bruit.  EXTREMITIES: Show 2+ bilateral pulses in dorsal, pedal, radial, and femoral arteries, without lower extremity edema, cyanosis, clubbing, or ulcers.  NEUROLOGIC: She is oriented to time, place, and person, with normal mood and affect.   ASSESSMENT: An 81 year old female with hypertension, hyperlipidemia, new-onset left bundle branch block with angina, consistent with unstable angina, with no current evidence of myocardial infarction.   RECOMMENDATIONS: Ambulate and follow for any further significant symptoms. The patient wishes to have a  conservative,  noninvasive assessment including stress test DUE TO CONCERNS THAT SHE HAS HAD AN EPISODE OF DYE REACTION IN THE REMOTE PAST and therefore would consider further treatment. We have discussed the possibility that her old dye reaction was secondary to other significant types of dye not used at this time, but wishes to continue conservative assessment. Therefore, ambulate and follow up for any further significant symptoms and further stress test for further evaluation of need in invasive procedures.      ____________________________ Corey Skains, MD bjk:dm D: 09/15/2012 08:36:00 ET T: 09/15/2012 09:31:28 ET JOB#: [id number]  cc: Corey Skains, MD, <Dictator> Corey Skains MD ELECTRONICALLY SIGNED 09/23/2012 8:02 Corey Skains MD ELECTRONICALLY SIGNED 09/23/2012 8:02 Corey Skains MD ELECTRONICALLY SIGNED 09/23/2012 8:02

## 2014-10-24 ENCOUNTER — Emergency Department
Admission: EM | Admit: 2014-10-24 | Discharge: 2014-10-24 | Disposition: A | Discharge status: Home / Self Care | Payer: Medicare Other | Attending: Emergency Medicine | Admitting: Emergency Medicine

## 2014-10-24 ENCOUNTER — Encounter: Payer: Self-pay | Admitting: Emergency Medicine

## 2014-10-24 DIAGNOSIS — Z7982 Long term (current) use of aspirin: Secondary | ICD-10-CM | POA: Diagnosis not present

## 2014-10-24 DIAGNOSIS — N390 Urinary tract infection, site not specified: Secondary | ICD-10-CM | POA: Diagnosis not present

## 2014-10-24 DIAGNOSIS — I10 Essential (primary) hypertension: Secondary | ICD-10-CM | POA: Diagnosis not present

## 2014-10-24 DIAGNOSIS — R309 Painful micturition, unspecified: Secondary | ICD-10-CM | POA: Diagnosis present

## 2014-10-24 DIAGNOSIS — Z79899 Other long term (current) drug therapy: Secondary | ICD-10-CM | POA: Insufficient documentation

## 2014-10-24 LAB — URINALYSIS COMPLETE WITH MICROSCOPIC (ARMC ONLY)
Bilirubin Urine: NEGATIVE
Glucose, UA: NEGATIVE mg/dL
Ketones, ur: NEGATIVE mg/dL
Nitrite: POSITIVE — AB
PH: 6 (ref 5.0–8.0)
PROTEIN: NEGATIVE mg/dL
Specific Gravity, Urine: 1.009 (ref 1.005–1.030)

## 2014-10-24 MED ORDER — CIPROFLOXACIN HCL 500 MG PO TABS
500.0000 mg | ORAL_TABLET | Freq: Once | ORAL | Status: AC
  Administered 2014-10-24: 500 mg via ORAL

## 2014-10-24 MED ORDER — CIPROFLOXACIN HCL 500 MG PO TABS
ORAL_TABLET | ORAL | Status: AC
  Administered 2014-10-24: 500 mg via ORAL
  Filled 2014-10-24: qty 1

## 2014-10-24 MED ORDER — CIPROFLOXACIN HCL 500 MG PO TABS: 500.0000 mg | ORAL_TABLET | Freq: Two times a day (BID) | ORAL | Status: DC

## 2014-10-24 MED ORDER — PHENAZOPYRIDINE HCL 200 MG PO TABS: 200.0000 mg | ORAL_TABLET | Freq: Three times a day (TID) | ORAL | Status: DC | PRN

## 2014-10-24 NOTE — ED Provider Notes (Signed)
[facility] Emergency Department Provider Note    ____________________________________________  Time seen:1726  I have reviewed the triage vital signs and the nursing notes.   HISTORY  Chief Complaint Cystitis      HPI [name] is a 81 y.o. female with a complaint of burning with urination started approximately one day ago started take some Azo since she knows that will clear. Infection so she is here today for evaluation and antibiotics rates it as mild no other complaints this time no nausea vomiting fevers chills nothing making it better or worse     Past Medical History  Diagnosis Date  . Hypertension   . Hyperlipidemia     Patient Active Problem List   Diagnosis Date Noted  . Nonischemic cardiomyopathy 06/03/2014  . Exertional dyspnea 04/07/2014  . Palpitations 03/23/2014  . Rash 04/17/2013  . Statin intolerance 03/19/2013  . Routine general medical examination at a health care facility 03/19/2013  . Chest pain, atypical 09/29/2012  . Ankle edema 03/13/2012  . History of ankle fracture 09/11/2011  . Fatigue 03/11/2011  . Other and unspecified hyperlipidemia 03/11/2011  . Need for prophylactic vaccination and inoculation against influenza 03/11/2011  . Hypertension 03/11/2011    Past Surgical History  Procedure Laterality Date  . Left ankle  Octo 2012    s/p pinning , Krazinski  . Abdominal hysterectomy    . Ankle      rods in both ankles  . Cardiac catheterization      [facility]  . Left heart catheterization with coronary angiogram N/A 10/31/2012    Procedure: LEFT HEART CATHETERIZATION WITH CORONARY ANGIOGRAM;  Surgeon: Clent Demark, MD;  Location: [facility] CATH LAB;  Service: Cardiovascular;  Laterality: N/A;    Current Outpatient Rx  Name  Route  Sig  Dispense  Refill  . amLODipine (NORVASC) 5 MG tablet   Oral   Take 5 mg by mouth 2 (two) times daily.          Marland Kitchen amLODipine (NORVASC) 5 MG tablet      TAKE ONE TABLET  TWICE DAILY   60 tablet   5   . aspirin 81 MG tablet   Oral   Take 81 mg by mouth daily.           . Calcium Carbonate-Vitamin D (CALCIUM + D) 600-200 MG-UNIT TABS   Oral   Take by mouth.           . captopril-hydrochlorothiazide (CAPOZIDE) 25-15 MG per tablet      TAKE ONE TABLET TWICE DAILY   60 tablet   5   . ciprofloxacin (CIPRO) 500 MG tablet   Oral   Take 1 tablet (500 mg total) by mouth 2 (two) times daily.   10 tablet   0   . diphenhydrAMINE (BENADRYL) 25 mg capsule   Oral   Take 1 capsule (25 mg total) by mouth every 6 (six) hours as needed for itching.   30 capsule   0   . fish oil-omega-3 fatty acids 1000 MG capsule   Oral   Take by mouth daily.           Marland Kitchen lisinopril-hydrochlorothiazide (PRINZIDE,ZESTORETIC) 20-25 MG per tablet   Oral   Take 1 tablet by mouth daily.   90 tablet   3   . metoprolol succinate (TOPROL-XL) 25 MG 24 hr tablet   Oral   Take 25 mg by mouth daily.         Marland Kitchen  Multiple Vitamin (MULTIVITAMIN) tablet   Oral   Take 1 tablet by mouth daily.           . niacin 500 MG tablet   Oral   Take 500 mg by mouth daily with breakfast.           . phenazopyridine (PYRIDIUM) 200 MG tablet   Oral   Take 1 tablet (200 mg total) by mouth 3 (three) times daily as needed for pain.   20 tablet   0   . TDaP (BOOSTRIX) 5-2.5-18.5 LF-MCG/0.5 injection   Intramuscular   Inject 0.5 mLs into the muscle once.   0.5 mL   0   . triamcinolone cream (KENALOG) 0.1 %   Topical   Apply topically 2 (two) times daily. Patient taking differently: Apply topically as needed.    30 g   0     Allergies Sulfa antibiotics and Contrast media  Family History  Problem Relation Age of Onset  . Hypertension Mother   . Heart attack Mother   . Hypertension Father   . Heart attack Brother     Social History History  Substance Use Topics  . Smoking status: Never Smoker   . Smokeless tobacco: Never Used  . Alcohol Use: No    Review of  Systems  Constitutional: Negative for fever. Eyes: Negative for visual changes. ENT: Negative for sore throat. Cardiovascular: Negative for chest pain. Respiratory: Negative for shortness of breath. Gastrointestinal: Negative for abdominal pain, vomiting and diarrhea. Genitourinary: Negative for dysuria. Musculoskeletal: Negative for back pain. Skin: Negative for rash. Neurological: Negative for headaches, focal weakness or numbness.   10-point ROS otherwise negative.  ____________________________________________   PHYSICAL EXAM:  VITAL SIGNS: ED Triage Vitals  Enc Vitals Group     BP 10/24/14 1712 91/75 mmHg     Pulse Rate 10/24/14 1712 64     Resp 10/24/14 1712 20     Temp 10/24/14 1712 98.1 F (36.7 C)     Temp Source 10/24/14 1712 Oral     SpO2 10/24/14 1712 96 %     Weight 10/24/14 1712 130 lb (58.968 kg)     Height 10/24/14 1712 5\' 1"  (1.549 m)     Head Cir --      Peak Flow --      Pain Score 10/24/14 1712 8     Pain Loc --      Pain Edu? --      Excl. in [place]? --      Constitutional: Alert and oriented. Well appearing and in no distress. Eyes: Conjunctivae are normal. PERRL. Normal extraocular movements. ENT   Head: Normocephalic and atraumatic.   Nose: No congestion/rhinnorhea.   Mouth/Throat: Mucous membranes are moist.   Neck: No stridor. Hematological/Lymphatic/Immunilogical: No cervical lymphadenopathy. Cardiovascular: Normal rate, regular rhythm. Normal and symmetric distal pulses are present in all extremities. No murmurs, rubs, or gallops. Respiratory: Normal respiratory effort without tachypnea nor retractions. Breath sounds are clear and equal bilaterally. No wheezes/rales/rhonchi. Gastrointestinal: Soft and nontender. No distention. No abdominal bruits. There is no CVA tenderness.  Musculoskeletal: Nontender with normal range of motion in all extremities. No joint effusions.  No lower extremity tenderness nor edema. Neurologic:   Normal speech and language. No gross focal neurologic deficits are appreciated. Speech is normal. No gait instability. Skin:  Skin is warm, dry and intact. No rash noted. Psychiatric: Mood and affect are normal. Speech and behavior are normal. Patient exhibits appropriate insight and judgment.  ____________________________________________  LABS (pertinent positives/negatives)  Positive for bacteria white blood cells red blood cells leuks and nitrites  ____________________________________________    PROCEDURES  Procedure(s) performed: None  Critical Care performed: No  ____________________________________________   INITIAL IMPRESSION / ASSESSMENT AND PLAN / ED COURSE  Pertinent labs & imaging results that were available during my care of the patient were reviewed by me and considered in my medical decision making (see chart for details).  Diagnostic impression on this patient urinary tract infection will start patient on Cipro and Pyridium follow-up with her primary care provider in 2-3 days for recheck of urinalysis for clearance return here for any acute concerns or worsening symptoms  ____________________________________________   FINAL CLINICAL IMPRESSION(S) / ED DIAGNOSES  Final diagnoses:  UTI (lower urinary tract infection)    Ramandeep Arington Verdene Rio, PA-C 10/24/14 1833  Carrie Mew, MD 10/24/14 [phone]

## 2014-10-24 NOTE — ED Notes (Signed)
Patient to ED with c/o feeling like she has a bladder infection, reports symptoms started this morning and she started taking otc Azo.

## 2014-10-26 LAB — URINE CULTURE

## 2014-10-28 ENCOUNTER — Ambulatory Visit (INDEPENDENT_AMBULATORY_CARE_PROVIDER_SITE_OTHER): Payer: Medicare Other | Admitting: Internal Medicine

## 2014-10-28 ENCOUNTER — Encounter: Payer: Self-pay | Admitting: Internal Medicine

## 2014-10-28 VITALS — BP 118/60 | HR 59 | Temp 98.0°F | Resp 14 | Ht 61.0 in | Wt 135.5 lb

## 2014-10-28 DIAGNOSIS — Z79899 Other long term (current) drug therapy: Secondary | ICD-10-CM | POA: Diagnosis not present

## 2014-10-28 DIAGNOSIS — N390 Urinary tract infection, site not specified: Secondary | ICD-10-CM | POA: Diagnosis not present

## 2014-10-28 DIAGNOSIS — I10 Essential (primary) hypertension: Secondary | ICD-10-CM | POA: Diagnosis not present

## 2014-10-28 MED ORDER — TRIAMCINOLONE ACETONIDE 0.1 % EX CREA: TOPICAL_CREAM | Freq: Two times a day (BID) | CUTANEOUS | Status: DC

## 2014-10-28 MED ORDER — CIPROFLOXACIN HCL 250 MG PO TABS: 250.0000 mg | ORAL_TABLET | Freq: Two times a day (BID) | ORAL | Status: DC

## 2014-10-28 MED ORDER — METOPROLOL SUCCINATE ER 25 MG PO TB24: 25.0000 mg | ORAL_TABLET | Freq: Every day | ORAL | Status: DC

## 2014-10-28 MED ORDER — LISINOPRIL-HYDROCHLOROTHIAZIDE 20-25 MG PO TABS: 1.0000 | ORAL_TABLET | Freq: Every day | ORAL | Status: DC

## 2014-10-28 NOTE — Progress Notes (Signed)
Patient ID: [name], female   DOB: [birth year], 81 y.o.   MRN: [id number]    Patient Active Problem List   Diagnosis Date Noted  . Encounter for long-term (current) use of other high-risk medications 10/31/2014  . UTI (urinary tract infection) 10/31/2014  . Nonischemic cardiomyopathy 06/03/2014  . Exertional dyspnea 04/07/2014  . Palpitations 03/23/2014  . Rash 04/17/2013  . Statin intolerance 03/19/2013  . Routine general medical examination at a health care facility 03/19/2013  . Chest pain, atypical 09/29/2012  . Ankle edema 03/13/2012  . History of ankle fracture 09/11/2011  . Fatigue 03/11/2011  . Other and unspecified hyperlipidemia 03/11/2011  . Need for prophylactic vaccination and inoculation against influenza 03/11/2011  . Hypertension 03/11/2011    Subjective:  CC:   Chief Complaint  Patient presents with  . Follow-up  . Urinary Tract Infection    HPI:   [name] is a 81 y.o. female who presents for  Er follow up for UTI  .  Patient developed sudden onset of suprapubic pain and dysuria last Saturday Occurred last Saturday and was treated in the ER at [facility] for UTI.  She was given rx for cipro and pyridium.  Her symptoms have resolved.  She has not had a UTI in over a year.  She denies incontinence and frequency currently as well. .     Past Medical History  Diagnosis Date  . Hypertension   . Hyperlipidemia     Past Surgical History  Procedure Laterality Date  . Left ankle  Octo 2012    s/p pinning , Krazinski  . Abdominal hysterectomy    . Ankle      rods in both ankles  . Cardiac catheterization      [facility]  . Left heart catheterization with coronary angiogram N/A 10/31/2012    Procedure: LEFT HEART CATHETERIZATION WITH CORONARY ANGIOGRAM;  Surgeon: Clent Demark, MD;  Location: [facility] CATH LAB;  Service: Cardiovascular;  Laterality: N/A;  . Brain surgery         The following portions of the patient's history were reviewed and updated as  appropriate: Allergies, current medications, and problem list.    Review of Systems:   Patient denies headache, fevers, malaise, unintentional weight loss, skin rash, eye pain, sinus congestion and sinus pain, sore throat, dysphagia,  hemoptysis , cough, dyspnea, wheezing, chest pain, palpitations, orthopnea, edema, abdominal pain, nausea, melena, diarrhea, constipation, flank pain, dysuria, hematuria, urinary  Frequency, nocturia, numbness, tingling, seizures,  Focal weakness, Loss of consciousness,  Tremor, insomnia, depression, anxiety, and suicidal ideation.     History   Social History  . Marital Status: Married    Spouse Name: N/A  . Number of Children: N/A  . Years of Education: N/A   Occupational History  . Not on file.   Social History Main Topics  . Smoking status: Never Smoker   . Smokeless tobacco: Never Used  . Alcohol Use: No  . Drug Use: No  . Sexual Activity: Not on file   Other Topics Concern  . Not on file   Social History Narrative    Objective:  Filed Vitals:   10/28/14 1558  BP: 118/60  Pulse: 59  Temp: 98 F (36.7 C)  Resp: 14     General appearance: alert, cooperative and appears stated age Ears: normal TM's and external ear canals both ears Throat: lips, mucosa, and tongue normal; teeth and gums normal Neck: no adenopathy, no carotid bruit, supple, symmetrical, trachea  midline and thyroid not enlarged, symmetric, no tenderness/mass/nodules Back: symmetric, no curvature. ROM normal. No CVA tenderness. Lungs: clear to auscultation bilaterally Heart: regular rate and rhythm, S1, S2 normal, no murmur, click, rub or gallop Abdomen: soft, non-tender; bowel sounds normal; no masses,  no organomegaly Pulses: 2+ and symmetric Skin: Skin color, texture, turgor normal. No rashes or lesions Lymph nodes: Cervical, supraclavicular, and axillary nodes normal.  Assessment and Plan:   Encounter for long-term (current) use of other high-risk  medications Repeat assessment of renal function and electrolytes is due.  There has been a slight decrease in GFR compared to 6 months ago. She is taking lisinopril/hct.  Will  Repeat in 6 months  Lab Results  Component Value Date   CREATININE 1.11 10/28/2014   Lab Results  Component Value Date   NA 138 10/28/2014   K 4.1 10/28/2014   CL 103 10/28/2014   CO2 30 10/28/2014      UTI (urinary tract infection) Treated o May 7 by [facility] ER with cipro with resolution of symptoms.    Hypertension Well controlled on current regimen. no changes today.   A total of 25 minutes of face to face time was spent with patient more than half of which was spent in counselling on the above mentioned issues.  Updated Medication List Outpatient Encounter Prescriptions as of 10/28/2014  Medication Sig  . amLODipine (NORVASC) 5 MG tablet Take 5 mg by mouth 2 (two) times daily.   Marland Kitchen aspirin 81 MG tablet Take 81 mg by mouth daily.    . Calcium Carbonate-Vitamin D (CALCIUM + D) 600-200 MG-UNIT TABS Take by mouth.    . fish oil-omega-3 fatty acids 1000 MG capsule Take by mouth daily.    . metoprolol succinate (TOPROL-XL) 25 MG 24 hr tablet Take 1 tablet (25 mg total) by mouth daily.  . Multiple Vitamin (MULTIVITAMIN) tablet Take 1 tablet by mouth daily.    . niacin 500 MG tablet Take 500 mg by mouth daily with breakfast.    . TDaP (BOOSTRIX) 5-2.5-18.5 LF-MCG/0.5 injection Inject 0.5 mLs into the muscle once.  . triamcinolone cream (KENALOG) 0.1 % Apply topically 2 (two) times daily. AS NEEDED FOR ITCHING  . [DISCONTINUED] captopril-hydrochlorothiazide (CAPOZIDE) 25-15 MG per tablet TAKE ONE TABLET TWICE DAILY  . [DISCONTINUED] ciprofloxacin (CIPRO) 500 MG tablet Take 1 tablet (500 mg total) by mouth 2 (two) times daily.  . [DISCONTINUED] lisinopril (PRINIVIL,ZESTRIL) 5 MG tablet Take 5 mg by mouth daily.  . [DISCONTINUED] metoprolol succinate (TOPROL-XL) 25 MG 24 hr tablet Take 25 mg by mouth daily.  .  [DISCONTINUED] triamcinolone cream (KENALOG) 0.1 % Apply topically 2 (two) times daily. (Patient taking differently: Apply topically as needed. )  . ciprofloxacin (CIPRO) 250 MG tablet Take 1 tablet (250 mg total) by mouth 2 (two) times daily.  Marland Kitchen lisinopril-hydrochlorothiazide (PRINZIDE,ZESTORETIC) 20-25 MG per tablet Take 1 tablet by mouth daily.  . phenazopyridine (PYRIDIUM) 200 MG tablet Take 1 tablet (200 mg total) by mouth 3 (three) times daily as needed for pain. (Patient not taking: Reported on 10/28/2014)  . [DISCONTINUED] amLODipine (NORVASC) 5 MG tablet TAKE ONE TABLET TWICE DAILY (Patient not taking: Reported on 10/28/2014)  . [DISCONTINUED] diphenhydrAMINE (BENADRYL) 25 mg capsule Take 1 capsule (25 mg total) by mouth every 6 (six) hours as needed for itching. (Patient not taking: Reported on 10/28/2014)  . [DISCONTINUED] lisinopril-hydrochlorothiazide (PRINZIDE,ZESTORETIC) 20-25 MG per tablet Take 1 tablet by mouth daily. (Patient not taking: Reported on 10/28/2014)  No facility-administered encounter medications on file as of 10/28/2014.

## 2014-10-28 NOTE — Progress Notes (Signed)
Pre-visit discussion using our clinic review tool. No additional management support is needed unless otherwise documented below in the visit note.  

## 2014-10-28 NOTE — Patient Instructions (Addendum)
Please take a probiotic ( Align, Floraque or Culturelle or the generic equivalent ) for a minimum  of 3 weeks  STARTING TODAY,  AND  whenever you are prescribed an antibiotic to prevent a serious antibiotic associated diarrhea  Called clostirudium dificile colitis and a vaginal yeast infection   I will give you a prescription for cipro  For the next weekend case of bladder infection .

## 2014-10-29 LAB — COMPREHENSIVE METABOLIC PANEL
ALBUMIN: 3.7 g/dL (ref 3.5–5.2)
ALT: 14 U/L (ref 0–35)
AST: 23 U/L (ref 0–37)
Alkaline Phosphatase: 63 U/L (ref 39–117)
BUN: 30 mg/dL — ABNORMAL HIGH (ref 6–23)
CALCIUM: 9.9 mg/dL (ref 8.4–10.5)
CHLORIDE: 103 meq/L (ref 96–112)
CO2: 30 meq/L (ref 19–32)
Creatinine, Ser: 1.11 mg/dL (ref 0.40–1.20)
GFR: 49.08 mL/min — AB (ref >60.00)
Glucose, Bld: 102 mg/dL — ABNORMAL HIGH (ref 70–99)
POTASSIUM: 4.1 meq/L (ref 3.5–5.1)
SODIUM: 138 meq/L (ref 135–145)
TOTAL PROTEIN: 6.9 g/dL (ref 6.0–8.3)
Total Bilirubin: 0.4 mg/dL (ref 0.2–1.2)

## 2014-10-31 ENCOUNTER — Encounter: Payer: Self-pay | Admitting: Internal Medicine

## 2014-10-31 DIAGNOSIS — N390 Urinary tract infection, site not specified: Secondary | ICD-10-CM | POA: Insufficient documentation

## 2014-10-31 DIAGNOSIS — Z79899 Other long term (current) drug therapy: Secondary | ICD-10-CM | POA: Insufficient documentation

## 2014-10-31 NOTE — Assessment & Plan Note (Signed)
Treated o May 7 by [facility] ER with cipro with resolution of symptoms.

## 2014-10-31 NOTE — Assessment & Plan Note (Signed)
Repeat assessment of renal function and electrolytes is due.  There has been a slight decrease in GFR compared to 6 months ago. She is taking lisinopril/hct.  Will  Repeat in 6 months  Lab Results  Component Value Date   CREATININE 1.11 10/28/2014   Lab Results  Component Value Date   NA 138 10/28/2014   K 4.1 10/28/2014   CL 103 10/28/2014   CO2 30 10/28/2014

## 2014-10-31 NOTE — Assessment & Plan Note (Signed)
Well controlled on current regimen.  no changes today.   

## 2014-11-02 ENCOUNTER — Encounter: Payer: Self-pay | Admitting: *Deleted

## 2014-11-27 DIAGNOSIS — M76822 Posterior tibial tendinitis, left leg: Secondary | ICD-10-CM | POA: Diagnosis not present

## 2014-11-30 ENCOUNTER — Encounter: Payer: Self-pay | Admitting: Cardiovascular Disease

## 2014-11-30 ENCOUNTER — Ambulatory Visit (INDEPENDENT_AMBULATORY_CARE_PROVIDER_SITE_OTHER): Payer: Medicare Other | Admitting: Cardiovascular Disease

## 2014-11-30 VITALS — BP 130/64 | HR 59 | Ht 61.5 in | Wt 133.0 lb

## 2014-11-30 DIAGNOSIS — I10 Essential (primary) hypertension: Secondary | ICD-10-CM

## 2014-11-30 DIAGNOSIS — I428 Other cardiomyopathies: Secondary | ICD-10-CM

## 2014-11-30 DIAGNOSIS — I429 Cardiomyopathy, unspecified: Secondary | ICD-10-CM

## 2014-11-30 DIAGNOSIS — R0602 Shortness of breath: Secondary | ICD-10-CM | POA: Diagnosis not present

## 2014-11-30 NOTE — Patient Instructions (Signed)
Medication Instructions: Continue same medications.   Labwork: None.   Procedures/Testing: None.   Follow-Up: 6 months with Dr. Edger Husain.   Any Additional Special Instructions Will Be Listed Below (If Applicable).   

## 2014-11-30 NOTE — Assessment & Plan Note (Signed)
Blood pressure is controlled on current medications. 

## 2014-11-30 NOTE — Assessment & Plan Note (Signed)
The patient seems to be stable from a cardiac standpoint with no evidence of fluid overload. Continue treatment with metoprolol and lisinopril.

## 2014-11-30 NOTE — Progress Notes (Signed)
Primary care physician: Dr. Derrel Nip  HPI  This is an 81 year old female who is here today for a follow up visit regarding exertional dyspnea and nonischemic cardiomyopathy. She had cardiac catheterization in May 2014 which showed mild nonobstructive coronary artery disease involving the LAD and left circumflex. Ejection fraction was normal.  She does have intermittent left bundle branch block. Echocardiogram in November 2015 showed an ejection fraction of 45-50%, mild-to-moderate aortic regurgitation, mild mitral regurgitation and mild-to-moderate tricuspid regurgitation. Pulmonary pressure was normal. She reports no significant change in her symptoms. No orthopnea, PND or lower extremity edema.  Allergies  Allergen Reactions  . Sulfa Antibiotics Hives and Swelling  . Contrast Media [Iodinated Diagnostic Agents] Rash and Other (See Comments)    Swelling of the body.     Current Outpatient Prescriptions on File Prior to Visit  Medication Sig Dispense Refill  . amLODipine (NORVASC) 5 MG tablet Take 5 mg by mouth 2 (two) times daily.     Marland Kitchen aspirin 81 MG tablet Take 81 mg by mouth daily.      . Calcium Carbonate-Vitamin D (CALCIUM + D) 600-200 MG-UNIT TABS Take by mouth.      . fish oil-omega-3 fatty acids 1000 MG capsule Take by mouth daily.      Marland Kitchen lisinopril-hydrochlorothiazide (PRINZIDE,ZESTORETIC) 20-25 MG per tablet Take 1 tablet by mouth daily. 90 tablet 3  . metoprolol succinate (TOPROL-XL) 25 MG 24 hr tablet Take 1 tablet (25 mg total) by mouth daily. 90 tablet 1  . Multiple Vitamin (MULTIVITAMIN) tablet Take 1 tablet by mouth daily.      . niacin 500 MG tablet Take 500 mg by mouth daily with breakfast.       No current facility-administered medications on file prior to visit.     Past Medical History  Diagnosis Date  . Hypertension   . Hyperlipidemia      Past Surgical History  Procedure Laterality Date  . Left ankle  Octo 2012    s/p pinning , Krazinski  . Abdominal  hysterectomy    . Ankle      rods in both ankles  . Cardiac catheterization      [facility]  . Left heart catheterization with coronary angiogram N/A 10/31/2012    Procedure: LEFT HEART CATHETERIZATION WITH CORONARY ANGIOGRAM;  Surgeon: Clent Demark, MD;  Location: [facility] CATH LAB;  Service: Cardiovascular;  Laterality: N/A;  . Brain surgery       Family History  Problem Relation Age of Onset  . Hypertension Mother   . Heart attack Mother   . Hypertension Father   . Heart attack Brother      History   Social History  . Marital Status: Married    Spouse Name: N/A  . Number of Children: N/A  . Years of Education: N/A   Occupational History  . Not on file.   Social History Main Topics  . Smoking status: Never Smoker   . Smokeless tobacco: Never Used  . Alcohol Use: No  . Drug Use: No  . Sexual Activity: Not on file   Other Topics Concern  . Not on file   Social History Narrative     ROS   PHYSICAL EXAM   BP 130/64 mmHg  Pulse 59  Ht 5' 1.5" (1.562 m)  Wt 133 lb (60.328 kg)  BMI 24.73 kg/m2 Constitutional: She is oriented to person, place, and time. She appears well-developed and well-nourished. No distress.  HENT: No nasal discharge.  Head: Normocephalic  and atraumatic.  Eyes: Pupils are equal and round. No discharge.  Neck: Normal range of motion. Neck supple. No JVD present. No thyromegaly present.  Cardiovascular: Normal rate, regular rhythm, normal heart sounds. Exam reveals no gallop and no friction rub. No murmur heard.  Pulmonary/Chest: Effort normal and breath sounds normal. No stridor. No respiratory distress. She has no wheezes. She has no rales. She exhibits no tenderness.  Abdominal: Soft. Bowel sounds are normal. She exhibits no distension. There is no tenderness. There is no rebound and no guarding.  Musculoskeletal: Normal range of motion. She exhibits no edema and no tenderness.  Neurological: She is alert and oriented to person, place, and time.  Coordination normal.  Skin: Skin is warm and dry. No rash noted. She is not diaphoretic. No erythema. No pallor.  Psychiatric: She has a normal mood and affect. Her behavior is normal. Judgment and thought content normal.    EKG: Sinus  Bradycardia  Low voltage in precordial leads.   -  Negative precordial T-waves  -Probably normal -consider anteroseptal ischemia.   ABNORMAL   ASSESSMENT AND PLAN

## 2014-12-16 ENCOUNTER — Other Ambulatory Visit: Payer: Self-pay | Admitting: Internal Medicine

## 2015-03-26 ENCOUNTER — Ambulatory Visit: Payer: Medicare Other

## 2015-03-31 ENCOUNTER — Ambulatory Visit (INDEPENDENT_AMBULATORY_CARE_PROVIDER_SITE_OTHER): Payer: Medicare Other

## 2015-03-31 VITALS — BP 130/64 | HR 67 | Temp 97.4°F | Resp 12 | Ht 58.5 in | Wt 127.4 lb

## 2015-03-31 DIAGNOSIS — Z23 Encounter for immunization: Secondary | ICD-10-CM | POA: Diagnosis not present

## 2015-03-31 DIAGNOSIS — Z0000 Encounter for general adult medical examination without abnormal findings: Secondary | ICD-10-CM | POA: Diagnosis not present

## 2015-03-31 NOTE — Progress Notes (Signed)
Subjective:   [name] is a 81 y.o. female who presents for Medicare Annual (Subsequent) preventive examination.  Review of Systems:  No ROS.  Medicare Wellness Visit.  Cardiac Risk Factors include: advanced age (>44men, >97 women);hypertension     Objective:     Vitals: BP 130/64 mmHg  Pulse 67  Temp(Src) 97.4 F (36.3 C) (Oral)  Resp 12  Ht 4' 10.5" (1.486 m)  Wt 127 lb 6.4 oz (57.788 kg)  BMI 26.17 kg/m2  SpO2 97%  LMP   Tobacco History  Smoking status  . Never Smoker   Smokeless tobacco  . Never Used     Counseling given: Not Answered   Past Medical History  Diagnosis Date  . Hypertension   . Hyperlipidemia    Past Surgical History  Procedure Laterality Date  . Left ankle  Octo 2012    s/p pinning , Krazinski  . Abdominal hysterectomy    . Ankle      rods in both ankles  . Cardiac catheterization      [facility]  . Left heart catheterization with coronary angiogram N/A 10/31/2012    Procedure: LEFT HEART CATHETERIZATION WITH CORONARY ANGIOGRAM;  Surgeon: Clent Demark, MD;  Location: [facility] CATH LAB;  Service: Cardiovascular;  Laterality: N/A;  . Brain surgery     Family History  Problem Relation Age of Onset  . Hypertension Mother   . Heart attack Mother   . Hypertension Father   . Heart attack Brother    History  Sexual Activity  . Sexual Activity: Not Currently    Outpatient Encounter Prescriptions as of 03/31/2015  Medication Sig  . amLODipine (NORVASC) 5 MG tablet TAKE 1 TABLET BY MOUTH TWICE DAILY  . aspirin 81 MG tablet Take 81 mg by mouth daily.    . Calcium Carbonate-Vitamin D (CALCIUM + D) 600-200 MG-UNIT TABS Take by mouth.    . fish oil-omega-3 fatty acids 1000 MG capsule Take by mouth daily.    Marland Kitchen lisinopril-hydrochlorothiazide (PRINZIDE,ZESTORETIC) 20-25 MG per tablet Take 1 tablet by mouth daily.  . metoprolol succinate (TOPROL-XL) 25 MG 24 hr tablet Take 1 tablet (25 mg total) by mouth daily.  . Multiple Vitamin (MULTIVITAMIN)  tablet Take 1 tablet by mouth daily.    . niacin 500 MG tablet Take 500 mg by mouth daily with breakfast.    . [DISCONTINUED] amLODipine (NORVASC) 5 MG tablet Take 5 mg by mouth 2 (two) times daily.    No facility-administered encounter medications on file as of 03/31/2015.    Activities of Daily Living In your present state of health, do you have any difficulty performing the following activities: 03/31/2015  Hearing? N  Vision? N  Difficulty concentrating or making decisions? N  Walking or climbing stairs? N  Dressing or bathing? N  Doing errands, shopping? N  Preparing Food and eating ? N  Using the Toilet? N  In the past six months, have you accidently leaked urine? N  Do you have problems with loss of bowel control? N  Managing your Medications? N  Managing your Finances? N  Housekeeping or managing your Housekeeping? N    Patient Care Team: Crecencio Mc, MD as PCP - General (Internal Medicine)    Assessment:    This is a routine wellness examination for [name]. The goal of the wellness visit is to assist the patient how to close the gaps in care and create a preventative care plan for the patient.   Calcium  and Vit D as appropriate/ Osteoporosis risk reviewed. DEXA SCAN referral declined.  Taking meds without issues; no barriers identified.  Safety issues reviewed; smoke detectors in the home. Firearms locked in a secure area. Wears seatbelts when driving or riding with others. No violence in the home.  No identified risk were noted; The patient was oriented x 3; appropriate in dress and manner and no objective failures at ADL's or IADL's.   Influenza vaccine administered today.  Patient Concerns: Hx of high cholesterol.  Requests labs.  Scheduled follow up visit with PCP.  Exercise Activities and Dietary recommendations Current Exercise Habits:: Home exercise routine, Type of exercise: walking, Time (Minutes): 40, Frequency (Times/Week): 5, Weekly Exercise  (Minutes/Week): 200, Intensity: Mild  Goals    . Increase physical activity     Tone core muscles with chair exercise.  As tolerated. Demonstrated.      Fall Risk Fall Risk  03/31/2015 03/23/2014 03/19/2013  Falls in the past year? No No No   Depression Screen PHQ 2/9 Scores 03/31/2015 03/23/2014 03/19/2013  PHQ - 2 Score 0 0 0     Cognitive Testing MMSE - Mini Mental State Exam 03/31/2015  Orientation to time 5  Orientation to Place 5  Registration 3  Attention/ Calculation 5  Recall 3  Language- name 2 objects 2  Language- repeat 1  Language- follow 3 step command 3  Language- read & follow direction 1  Write a sentence 1  Copy design 1  Total score 30    Immunization History  Administered Date(s) Administered  . Influenza Split 03/20/2011, 03/13/2012, 03/09/2014  . Influenza,inj,Quad PF,36+ Mos 03/31/2015  . Influenza-Unspecified 03/14/2013, 03/07/2014  . Pneumococcal Conjugate-13 03/23/2014  . Pneumococcal Polysaccharide-23 03/19/2010  . Tdap 04/01/2013  . Zoster 03/20/2011   Screening Tests Health Maintenance  Topic Date Due  . DEXA SCAN  10/29/1989  . INFLUENZA VACCINE  01/18/2016  . TETANUS/TDAP  04/02/2023  . ZOSTAVAX  Completed  . PNA vac Low Risk Adult  Completed      Plan:   End of life planning was discussed; aging in home or other; Advanced aging; plans to return copy of HCPOA/Living Will.  During the course of the visit the patient was educated and counseled about the following appropriate screening and preventive services:   Vaccines to include Pneumoccal, Influenza, Hepatitis B, Td, Zostavax, HCV  Electrocardiogram  Cardiovascular Disease  Colorectal cancer screening  Bone density screening  Diabetes screening  Glaucoma screening  Mammography/PAP  Nutrition counseling   Patient Instructions (the written plan) was given to the patient.   OBrien-Blaney, Denisa L, LPN  [id number]   I have reviewed the above information and  agree with above.   Deborra Medina, MD

## 2015-03-31 NOTE — Patient Instructions (Signed)
[name],  Thank you for taking time to come for your Medicare Wellness Visit.  I appreciate your ongoing commitment to your health goals. Please review the following plan we discussed and let me know if I can assist you in the future.  Bring a copy of advanced directives for scanning

## 2015-04-07 ENCOUNTER — Ambulatory Visit (INDEPENDENT_AMBULATORY_CARE_PROVIDER_SITE_OTHER): Payer: Medicare Other | Admitting: Internal Medicine

## 2015-04-07 ENCOUNTER — Encounter: Payer: Self-pay | Admitting: Internal Medicine

## 2015-04-07 VITALS — BP 138/70 | HR 58 | Temp 97.9°F | Resp 12 | Ht 59.0 in | Wt 125.5 lb

## 2015-04-07 DIAGNOSIS — E875 Hyperkalemia: Secondary | ICD-10-CM | POA: Diagnosis not present

## 2015-04-07 DIAGNOSIS — Z1239 Encounter for other screening for malignant neoplasm of breast: Secondary | ICD-10-CM

## 2015-04-07 DIAGNOSIS — E785 Hyperlipidemia, unspecified: Secondary | ICD-10-CM

## 2015-04-07 DIAGNOSIS — Z789 Other specified health status: Secondary | ICD-10-CM

## 2015-04-07 DIAGNOSIS — I10 Essential (primary) hypertension: Secondary | ICD-10-CM | POA: Diagnosis not present

## 2015-04-07 DIAGNOSIS — Z889 Allergy status to unspecified drugs, medicaments and biological substances status: Secondary | ICD-10-CM

## 2015-04-07 LAB — LDL CHOLESTEROL, DIRECT: Direct LDL: 159 mg/dL

## 2015-04-07 LAB — LIPID PANEL
CHOLESTEROL: 262 mg/dL — AB (ref 0–200)
HDL: 61.3 mg/dL (ref >39.00)
LDL CALC: 181 mg/dL — AB (ref 0–99)
NONHDL: 200.86
Total CHOL/HDL Ratio: 4
Triglycerides: 100 mg/dL (ref 0.0–149.0)
VLDL: 20 mg/dL (ref 0.0–40.0)

## 2015-04-07 LAB — COMPREHENSIVE METABOLIC PANEL
ALBUMIN: 4 g/dL (ref 3.5–5.2)
ALT: 17 U/L (ref 0–35)
AST: 39 U/L — ABNORMAL HIGH (ref 0–37)
Alkaline Phosphatase: 45 U/L (ref 39–117)
BUN: 16 mg/dL (ref 6–23)
CALCIUM: 10.5 mg/dL (ref 8.4–10.5)
CHLORIDE: 102 meq/L (ref 96–112)
CO2: 27 meq/L (ref 19–32)
CREATININE: 0.84 mg/dL (ref 0.40–1.20)
GFR: 67.63 mL/min (ref >60.00)
Glucose, Bld: 85 mg/dL (ref 70–99)
Potassium: 5.2 mEq/L — ABNORMAL HIGH (ref 3.5–5.1)
Sodium: 139 mEq/L (ref 135–145)
Total Bilirubin: 0.6 mg/dL (ref 0.2–1.2)
Total Protein: 7.4 g/dL (ref 6.0–8.3)

## 2015-04-07 NOTE — Progress Notes (Signed)
Subjective:  Patient ID: [name], female    DOB: 07/19/1922  Age: 81 y.o. MRN: [id number]  CC: The primary encounter diagnosis was Hyperlipidemia. Diagnoses of Essential hypertension, Breast cancer screening, Hyperkalemia, and Statin intolerance were also pertinent to this visit.  HPI [name] presents for follow up on hypertension,  Untreated hyperlipidemia secondary to statin intolerance. Last seen in May .  She feels generally well , is taking her medications as prescribed, and reports no side effects.  No recent falls. Sleeping well.    Outpatient Prescriptions Prior to Visit  Medication Sig Dispense Refill  . amLODipine (NORVASC) 5 MG tablet TAKE 1 TABLET BY MOUTH TWICE DAILY 60 tablet 6  . aspirin 81 MG tablet Take 81 mg by mouth daily.      . Calcium Carbonate-Vitamin D (CALCIUM + D) 600-200 MG-UNIT TABS Take by mouth.      . fish oil-omega-3 fatty acids 1000 MG capsule Take by mouth daily.      Marland Kitchen lisinopril-hydrochlorothiazide (PRINZIDE,ZESTORETIC) 20-25 MG per tablet Take 1 tablet by mouth daily. 90 tablet 3  . metoprolol succinate (TOPROL-XL) 25 MG 24 hr tablet Take 1 tablet (25 mg total) by mouth daily. 90 tablet 1  . Multiple Vitamin (MULTIVITAMIN) tablet Take 1 tablet by mouth daily.      . niacin 500 MG tablet Take 500 mg by mouth daily with breakfast.       No facility-administered medications prior to visit.    Review of Systems;  Patient denies headache, fevers, malaise, unintentional weight loss, skin rash, eye pain, sinus congestion and sinus pain, sore throat, dysphagia,  hemoptysis , cough, dyspnea, wheezing, chest pain, palpitations, orthopnea, edema, abdominal pain, nausea, melena, diarrhea, constipation, flank pain, dysuria, hematuria, urinary  Frequency, nocturia, numbness, tingling, seizures,  Focal weakness, Loss of consciousness,  Tremor, insomnia, depression, anxiety, and suicidal ideation.      Objective:  BP 138/70 mmHg  Pulse 58   Temp(Src) 97.9 F (36.6 C) (Oral)  Resp 12  Ht 4\' 11"  (1.499 m)  Wt 125 lb 8 oz (56.926 kg)  BMI 25.33 kg/m2  SpO2 98%  BP Readings from Last 3 Encounters:  04/07/15 138/70  03/31/15 130/64  11/30/14 130/64    Wt Readings from Last 3 Encounters:  04/07/15 125 lb 8 oz (56.926 kg)  03/31/15 127 lb 6.4 oz (57.788 kg)  11/30/14 133 lb (60.328 kg)    General appearance: alert, cooperative and appears stated age Ears: normal TM's and external ear canals both ears Throat: lips, mucosa, and tongue normal; teeth and gums normal Neck: no adenopathy, no carotid bruit, supple, symmetrical, trachea midline and thyroid not enlarged, symmetric, no tenderness/mass/nodules Back: symmetric, no curvature. ROM normal. No CVA tenderness. Lungs: clear to auscultation bilaterally Heart: regular rate and rhythm, S1, S2 normal, no murmur, click, rub or gallop Abdomen: soft, non-tender; bowel sounds normal; no masses,  no organomegaly Pulses: 2+ and symmetric Skin: Skin color, texture, turgor normal. No rashes or lesions Lymph nodes: Cervical, supraclavicular, and axillary nodes normal.  No results found for: HGBA1C  Lab Results  Component Value Date   CREATININE 0.84 04/07/2015   CREATININE 1.11 10/28/2014   CREATININE 1.0 03/23/2014    Lab Results  Component Value Date   WBC 9.0 03/23/2014   HGB 14.4 03/23/2014   HCT 43.0 03/23/2014   PLT 209.0 03/23/2014   GLUCOSE 85 04/07/2015   CHOL 262* 04/07/2015   TRIG 100.0 04/07/2015   HDL 61.30 04/07/2015  LDLDIRECT 159.0 04/07/2015   LDLCALC 181* 04/07/2015   ALT 17 04/07/2015   AST 39* 04/07/2015   NA 139 04/07/2015   K 5.2* 04/07/2015   CL 102 04/07/2015   CREATININE 0.84 04/07/2015   BUN 16 04/07/2015   CO2 27 04/07/2015   TSH 3.30 03/23/2014    No results found.  Assessment & Plan:   Problem List Items Addressed This Visit    Hypertension    Well controlled on current regimen. Renal function stable, no changes  today.  Lab Results  Component Value Date   CREATININE 0.84 04/07/2015   Lab Results  Component Value Date   NA 139 04/07/2015   K 5.2* 04/07/2015   CL 102 04/07/2015   CO2 27 04/07/2015         Relevant Orders   Comprehensive metabolic panel (Completed)   Statin intolerance    She did not tolerate pravastatin due to joint pain.  Lipids are unchanged,   Lab Results  Component Value Date   CHOL 262* 04/07/2015   HDL 61.30 04/07/2015   LDLCALC 181* 04/07/2015   LDLDIRECT 159.0 04/07/2015   TRIG 100.0 04/07/2015   CHOLHDL 4 04/07/2015         Hyperkalemia    Etiology unclear.  Will repeat  In a few days and stop the ARB if elevated.        Other Visit Diagnoses    Hyperlipidemia    -  Primary    Relevant Orders    LDL cholesterol, direct (Completed)    Lipid panel (Completed)    Breast cancer screening        Relevant Orders    MM DIGITAL SCREENING BILATERAL       I am having Ms. Gullikson maintain her fish oil-omega-3 fatty acids, niacin, Calcium Carbonate-Vitamin D, multivitamin, aspirin, lisinopril-hydrochlorothiazide, metoprolol succinate, and amLODipine.  No orders of the defined types were placed in this encounter.    There are no discontinued medications.  Follow-up: Return in about 6 months (around 10/06/2015).   Crecencio Mc, MD

## 2015-04-07 NOTE — Patient Instructions (Signed)
You are doing well.  I do recommend the screening fair.  You can drop off the results to me and if anything is abnormal I will set you up for an evaluation with vascular Surgery  I will set up your mammogram  Please consider getting the second Pneumovax (pneumonia) vaccine

## 2015-04-07 NOTE — Progress Notes (Signed)
Pre-visit discussion using our clinic review tool. No additional management support is needed unless otherwise documented below in the visit note.  

## 2015-04-08 ENCOUNTER — Other Ambulatory Visit: Payer: Self-pay | Admitting: Internal Medicine

## 2015-04-08 DIAGNOSIS — E875 Hyperkalemia: Secondary | ICD-10-CM

## 2015-04-08 NOTE — Assessment & Plan Note (Signed)
Etiology unclear.  Will repeat  In a few days and stop the ARB if elevated.

## 2015-04-08 NOTE — Assessment & Plan Note (Signed)
She did not tolerate pravastatin due to joint pain.  Lipids are unchanged,   Lab Results  Component Value Date   CHOL 262* 04/07/2015   HDL 61.30 04/07/2015   LDLCALC 181* 04/07/2015   LDLDIRECT 159.0 04/07/2015   TRIG 100.0 04/07/2015   CHOLHDL 4 04/07/2015

## 2015-04-08 NOTE — Assessment & Plan Note (Signed)
Well controlled on current regimen. Renal function stable, no changes today.  Lab Results  Component Value Date   CREATININE 0.84 04/07/2015   Lab Results  Component Value Date   NA 139 04/07/2015   K 5.2* 04/07/2015   CL 102 04/07/2015   CO2 27 04/07/2015

## 2015-04-12 ENCOUNTER — Other Ambulatory Visit (INDEPENDENT_AMBULATORY_CARE_PROVIDER_SITE_OTHER): Payer: Medicare Other

## 2015-04-12 DIAGNOSIS — E875 Hyperkalemia: Secondary | ICD-10-CM | POA: Diagnosis not present

## 2015-04-12 LAB — BASIC METABOLIC PANEL
BUN: 21 mg/dL (ref 6–23)
CO2: 30 meq/L (ref 19–32)
Calcium: 9.1 mg/dL (ref 8.4–10.5)
Chloride: 103 mEq/L (ref 96–112)
Creatinine, Ser: 0.96 mg/dL (ref 0.40–1.20)
GFR: 57.97 mL/min — ABNORMAL LOW (ref >60.00)
GLUCOSE: 126 mg/dL — AB (ref 70–99)
Potassium: 3.6 mEq/L (ref 3.5–5.1)
Sodium: 140 mEq/L (ref 135–145)

## 2015-04-20 ENCOUNTER — Encounter: Payer: Self-pay | Admitting: *Deleted

## 2015-05-02 DIAGNOSIS — M47812 Spondylosis without myelopathy or radiculopathy, cervical region: Secondary | ICD-10-CM | POA: Diagnosis not present

## 2015-05-02 DIAGNOSIS — M542 Cervicalgia: Secondary | ICD-10-CM | POA: Diagnosis not present

## 2015-05-07 DIAGNOSIS — M542 Cervicalgia: Secondary | ICD-10-CM | POA: Diagnosis not present

## 2015-06-04 ENCOUNTER — Ambulatory Visit (INDEPENDENT_AMBULATORY_CARE_PROVIDER_SITE_OTHER): Payer: Medicare Other | Admitting: Cardiovascular Disease

## 2015-06-04 ENCOUNTER — Encounter: Payer: Self-pay | Admitting: Cardiovascular Disease

## 2015-06-04 VITALS — BP 120/60 | HR 65 | Ht <= 58 in | Wt 130.0 lb

## 2015-06-04 DIAGNOSIS — I10 Essential (primary) hypertension: Secondary | ICD-10-CM | POA: Diagnosis not present

## 2015-06-04 DIAGNOSIS — R002 Palpitations: Secondary | ICD-10-CM | POA: Diagnosis not present

## 2015-06-04 DIAGNOSIS — I428 Other cardiomyopathies: Secondary | ICD-10-CM

## 2015-06-04 DIAGNOSIS — I429 Cardiomyopathy, unspecified: Secondary | ICD-10-CM | POA: Diagnosis not present

## 2015-06-04 DIAGNOSIS — I5022 Chronic systolic (congestive) heart failure: Secondary | ICD-10-CM | POA: Diagnosis not present

## 2015-06-04 NOTE — Assessment & Plan Note (Signed)
Due to nonischemic cardiomyopathy with most recent ejection fraction of 45-50%. No evidence of fluid overload. Blood pressure is under optimal control.

## 2015-06-04 NOTE — Progress Notes (Signed)
Primary care physician: Dr. Derrel Nip  HPI  This is an 81 year old female who is here today for a follow up visit regarding nonischemic cardiomyopathy. She had cardiac catheterization in May 2014 which showed mild nonobstructive coronary artery disease involving the LAD and left circumflex. Ejection fraction was normal.  She does have intermittent left bundle branch block. Echocardiogram in November 2015 showed an ejection fraction of 45-50%, mild-to-moderate aortic regurgitation, mild mitral regurgitation and mild-to-moderate tricuspid regurgitation. Pulmonary pressure was normal. She reports no significant change in her symptoms. No orthopnea, PND or lower extremity edema. She is very functional and appears much younger than her stated age. She lives with her 67 year old husband.  Allergies  Allergen Reactions  . Sulfa Antibiotics Hives and Swelling  . Contrast Media [Iodinated Diagnostic Agents] Rash and Other (See Comments)    Swelling of the body.     Current Outpatient Prescriptions on File Prior to Visit  Medication Sig Dispense Refill  . amLODipine (NORVASC) 5 MG tablet TAKE 1 TABLET BY MOUTH TWICE DAILY 60 tablet 6  . aspirin 81 MG tablet Take 81 mg by mouth daily.      . Calcium Carbonate-Vitamin D (CALCIUM + D) 600-200 MG-UNIT TABS Take by mouth.      . fish oil-omega-3 fatty acids 1000 MG capsule Take by mouth daily.      Marland Kitchen lisinopril-hydrochlorothiazide (PRINZIDE,ZESTORETIC) 20-25 MG per tablet Take 1 tablet by mouth daily. 90 tablet 3  . metoprolol succinate (TOPROL-XL) 25 MG 24 hr tablet Take 1 tablet (25 mg total) by mouth daily. 90 tablet 1  . Multiple Vitamin (MULTIVITAMIN) tablet Take 1 tablet by mouth daily.      . niacin 500 MG tablet Take 500 mg by mouth daily with breakfast.       No current facility-administered medications on file prior to visit.     Past Medical History  Diagnosis Date  . Hypertension   . Hyperlipidemia      Past Surgical History    Procedure Laterality Date  . Left ankle  Octo 2012    s/p pinning , Krazinski  . Abdominal hysterectomy    . Ankle      rods in both ankles  . Cardiac catheterization      [facility]  . Left heart catheterization with coronary angiogram N/A 10/31/2012    Procedure: LEFT HEART CATHETERIZATION WITH CORONARY ANGIOGRAM;  Surgeon: Clent Demark, MD;  Location: [facility] CATH LAB;  Service: Cardiovascular;  Laterality: N/A;  . Brain surgery       Family History  Problem Relation Age of Onset  . Hypertension Mother   . Heart attack Mother   . Hypertension Father   . Heart attack Brother      Social History   Social History  . Marital Status: Married    Spouse Name: N/A  . Number of Children: N/A  . Years of Education: N/A   Occupational History  . Not on file.   Social History Main Topics  . Smoking status: Never Smoker   . Smokeless tobacco: Never Used  . Alcohol Use: No  . Drug Use: No  . Sexual Activity: Not Currently   Other Topics Concern  . Not on file   Social History Narrative     ROS   PHYSICAL EXAM   BP 120/60 mmHg  Pulse 65  Ht 4\' 10"  (1.473 m)  Wt 130 lb (58.968 kg)  BMI 27.18 kg/m2 Constitutional: She is oriented to person, place, and time. She  appears well-developed and well-nourished. No distress.  HENT: No nasal discharge.  Head: Normocephalic and atraumatic.  Eyes: Pupils are equal and round. No discharge.  Neck: Normal range of motion. Neck supple. No JVD present. No thyromegaly present.  Cardiovascular: Normal rate, regular rhythm, normal heart sounds. Exam reveals no gallop and no friction rub. No murmur heard.  Pulmonary/Chest: Effort normal and breath sounds normal. No stridor. No respiratory distress. She has no wheezes. She has no rales. She exhibits no tenderness.  Abdominal: Soft. Bowel sounds are normal. She exhibits no distension. There is no tenderness. There is no rebound and no guarding.  Musculoskeletal: Normal range of motion. She  exhibits no edema and no tenderness.  Neurological: She is alert and oriented to person, place, and time. Coordination normal.  Skin: Skin is warm and dry. No rash noted. She is not diaphoretic. No erythema. No pallor.  Psychiatric: She has a normal mood and affect. Her behavior is normal. Judgment and thought content normal.    EKG: Normal sinus rhythm with no significant ST or T wave changes.  ASSESSMENT AND PLAN

## 2015-06-04 NOTE — Patient Instructions (Signed)
Medication Instructions: Continue same medications.   Labwork: None.   Procedures/Testing: None.   Follow-Up: 6 months with Dr. Jowel Waltner.   Any Additional Special Instructions Will Be Listed Below (If Applicable).   

## 2015-06-04 NOTE — Assessment & Plan Note (Signed)
Blood pressure is well controlled on Toprol, lisinopril-hydrochlorothiazide and amlodipine with no side effects.

## 2015-06-05 ENCOUNTER — Other Ambulatory Visit: Payer: Self-pay | Admitting: Internal Medicine

## 2015-06-16 ENCOUNTER — Encounter: Payer: Self-pay | Admitting: *Deleted

## 2015-07-17 ENCOUNTER — Other Ambulatory Visit: Payer: Self-pay | Admitting: Internal Medicine

## 2015-09-04 ENCOUNTER — Other Ambulatory Visit: Payer: Self-pay | Admitting: Internal Medicine

## 2015-09-20 DIAGNOSIS — M2041 Other hammer toe(s) (acquired), right foot: Secondary | ICD-10-CM | POA: Diagnosis not present

## 2015-10-18 ENCOUNTER — Other Ambulatory Visit: Payer: Self-pay | Admitting: Internal Medicine

## 2015-10-29 DIAGNOSIS — H6123 Impacted cerumen, bilateral: Secondary | ICD-10-CM | POA: Diagnosis not present

## 2015-10-29 DIAGNOSIS — H93293 Other abnormal auditory perceptions, bilateral: Secondary | ICD-10-CM | POA: Diagnosis not present

## 2015-11-17 ENCOUNTER — Other Ambulatory Visit: Payer: Self-pay | Admitting: Internal Medicine

## 2015-11-30 ENCOUNTER — Other Ambulatory Visit: Payer: Self-pay | Admitting: Internal Medicine

## 2015-12-03 ENCOUNTER — Telehealth: Payer: Self-pay | Admitting: Internal Medicine

## 2015-12-03 NOTE — Telephone Encounter (Signed)
Pt called stating she wanted to join weight loss program and was told she needs a BMI sheet telling what she should weigh at her height?   Call pt @ [phone]. Thank you!

## 2015-12-06 ENCOUNTER — Ambulatory Visit (INDEPENDENT_AMBULATORY_CARE_PROVIDER_SITE_OTHER): Payer: Medicare Other | Admitting: Cardiovascular Disease

## 2015-12-06 ENCOUNTER — Encounter: Payer: Self-pay | Admitting: Cardiovascular Disease

## 2015-12-06 VITALS — BP 110/60 | HR 66 | Ht 60.0 in | Wt 131.8 lb

## 2015-12-06 DIAGNOSIS — I5022 Chronic systolic (congestive) heart failure: Secondary | ICD-10-CM

## 2015-12-06 DIAGNOSIS — I428 Other cardiomyopathies: Secondary | ICD-10-CM

## 2015-12-06 DIAGNOSIS — I10 Essential (primary) hypertension: Secondary | ICD-10-CM

## 2015-12-06 DIAGNOSIS — I429 Cardiomyopathy, unspecified: Secondary | ICD-10-CM

## 2015-12-06 NOTE — Telephone Encounter (Signed)
Patient will be coming by tomorrow to pick up BMI sheets. They will be ready for pick up.

## 2015-12-06 NOTE — Progress Notes (Signed)
Cardiology Office Note   Date:  12/06/2015   ID:  [name], DOB 05/29/1923, MRN [id number]  PCP:  Crecencio Mc, MD  Cardiologist:   Kathlyn Sacramento, MD   Chief Complaint  Patient presents with  . other    6 month follow up. Meds reviewed by the patient verbally. "doing well."       History of Present Illness: [name] is a 82 y.o. female who presents for a follow up visit regarding nonischemic cardiomyopathy. She had cardiac catheterization in May 2014 which showed mild nonobstructive coronary artery disease involving the LAD and left circumflex. Ejection fraction was normal.  She does have intermittent left bundle branch block. Echocardiogram in November 2015 showed an ejection fraction of 45-50%, mild-to-moderate aortic regurgitation, mild mitral regurgitation and mild-to-moderate tricuspid regurgitation. Pulmonary pressure was normal. She reports no significant change in her symptoms. No orthopnea, PND or lower extremity edema. She is very functional and appears much younger than her stated age. Unfortunately, her husband is having more medical issues. They recently sold their house and rented an apartment.   Past Medical History  Diagnosis Date  . Hypertension   . Hyperlipidemia     Past Surgical History  Procedure Laterality Date  . Left ankle  Octo 2012    s/p pinning , Krazinski  . Abdominal hysterectomy    . Ankle      rods in both ankles  . Cardiac catheterization      [facility]  . Left heart catheterization with coronary angiogram N/A 10/31/2012    Procedure: LEFT HEART CATHETERIZATION WITH CORONARY ANGIOGRAM;  Surgeon: Clent Demark, MD;  Location: [facility] CATH LAB;  Service: Cardiovascular;  Laterality: N/A;  . Brain surgery       Current Outpatient Prescriptions  Medication Sig Dispense Refill  . amLODipine (NORVASC) 5 MG tablet TAKE ONE TABLET TWICE DAILY 60 tablet 2  . aspirin 81 MG tablet Take 81 mg by mouth daily.      . Calcium  Carbonate-Vitamin D (CALCIUM + D) 600-200 MG-UNIT TABS Take by mouth.      . fish oil-omega-3 fatty acids 1000 MG capsule Take by mouth daily.      Marland Kitchen lisinopril-hydrochlorothiazide (PRINZIDE,ZESTORETIC) 20-25 MG tablet TAKE ONE TABLET BY MOUTH DAILY 90 tablet 1  . metoprolol succinate (TOPROL-XL) 25 MG 24 hr tablet TAKE ONE TABLET BY MOUTH EVERY DAY 30 tablet 1  . Multiple Vitamin (MULTIVITAMIN) tablet Take 1 tablet by mouth daily.      . niacin 500 MG tablet Take 500 mg by mouth daily with breakfast.       No current facility-administered medications for this visit.    Allergies:   Sulfa antibiotics and Contrast media    Social History:  The patient  reports that she has never smoked. She has never used smokeless tobacco. She reports that she does not drink alcohol or use illicit drugs.   Family History:  The patient's family history includes Heart attack in her brother and mother; Hypertension in her father and mother.    ROS:  Please see the history of present illness.   Otherwise, review of systems are positive for none.   All other systems are reviewed and negative.    PHYSICAL EXAM: VS:  BP 110/60 mmHg  Pulse 66  Ht 5' (1.524 m)  Wt 131 lb 12 oz (59.761 kg)  BMI 25.73 kg/m2 , BMI Body mass index is 25.73 kg/(m^2). GEN: Well nourished, well developed,  in no acute distress HEENT: normal Neck: no JVD, carotid bruits, or masses Cardiac: RRR; no rubs, or gallops,no edema . One out of 6 systolic murmur in the aortic area Respiratory:  clear to auscultation bilaterally, normal work of breathing GI: soft, nontender, nondistended, + BS MS: no deformity or atrophy Skin: warm and dry, no rash Neuro:  Strength and sensation are intact Psych: euthymic mood, full affect   EKG:  EKG is ordered today. The ekg ordered today demonstrates normal sinus rhythm with left bundle branch block.   Recent Labs: 04/07/2015: ALT 17 04/12/2015: BUN 21; Creatinine, Ser 0.96; Potassium 3.6; Sodium  140    Lipid Panel    Component Value Date/Time   CHOL 262* 04/07/2015 0957   CHOL 226* 09/16/2012 0542   TRIG 100.0 04/07/2015 0957   TRIG 109 09/16/2012 0542   HDL 61.30 04/07/2015 0957   HDL 60 09/16/2012 0542   CHOLHDL 4 04/07/2015 0957   VLDL 20.0 04/07/2015 0957   VLDL 22 09/16/2012 0542   LDLCALC 181* 04/07/2015 0957   LDLCALC 144* 09/16/2012 0542   LDLDIRECT 159.0 04/07/2015 0957      Wt Readings from Last 3 Encounters:  12/06/15 131 lb 12 oz (59.761 kg)  06/04/15 130 lb (58.968 kg)  04/07/15 125 lb 8 oz (56.926 kg)        ASSESSMENT AND PLAN:  1.  Chronic systolic heart failure: Due to nonischemic cardiomyopathy. Ejection fraction was 45-50%. Currently New York Heart Association class II. No evidence of volume overload. Continue medical therapy.  2. Essential hypertension: Blood pressure is under excellent control on current medications.   Disposition:   FU with me in 6 months  Signed,  Kathlyn Sacramento, MD  12/06/2015 2:02 PM    [place]

## 2015-12-06 NOTE — Patient Instructions (Signed)
Medication Instructions: Continue same medications.   Labwork: None.   Procedures/Testing: None.   Follow-Up: 6 months with Dr. Johnice Riebe.   Any Additional Special Instructions Will Be Listed Below (If Applicable).     If you need a refill on your cardiac medications before your next appointment, please call your pharmacy.   

## 2016-01-04 ENCOUNTER — Other Ambulatory Visit: Payer: Self-pay | Admitting: Internal Medicine

## 2016-01-22 ENCOUNTER — Other Ambulatory Visit: Payer: Self-pay | Admitting: Internal Medicine

## 2016-02-14 ENCOUNTER — Other Ambulatory Visit: Payer: Self-pay | Admitting: Internal Medicine

## 2016-03-28 DIAGNOSIS — Z23 Encounter for immunization: Secondary | ICD-10-CM | POA: Diagnosis not present

## 2016-03-31 ENCOUNTER — Ambulatory Visit (INDEPENDENT_AMBULATORY_CARE_PROVIDER_SITE_OTHER): Payer: Medicare Other

## 2016-03-31 VITALS — BP 122/62 | HR 60 | Temp 97.9°F | Resp 12 | Ht 58.5 in | Wt 130.1 lb

## 2016-03-31 DIAGNOSIS — Z0000 Encounter for general adult medical examination without abnormal findings: Secondary | ICD-10-CM | POA: Diagnosis not present

## 2016-03-31 NOTE — Patient Instructions (Addendum)
   [name] , Thank you for taking time to come for your Medicare Wellness Visit. I appreciate your ongoing commitment to your health goals. Please review the following plan we discussed and let me know if I can assist you in the future.   These are the goals we discussed: Goals    . Increase physical activity          Stay hydrated and drink plenty of fluids. Stay active and continue walking regimen. Core strengthening exercises.  Low carb foods.  Lean meats and vegetables.         This is a list of the screening recommended for you and due dates:  Health Maintenance  Topic Date Due  . DEXA scan (bone density measurement)  10/29/1989  . Tetanus Vaccine  04/02/2023  . Flu Shot  Completed  . Shingles Vaccine  Completed  . Pneumonia vaccines  Completed    Bone Densitometry Bone densitometry is an imaging test that uses a special X-ray to measure the amount of calcium and other minerals in your bones (bone density). This test is also known as a bone mineral density test or dual-energy X-ray absorptiometry (DXA). The test can measure bone density at your hip and your spine. It is similar to having a regular X-ray. You may have this test to:  Diagnose a condition that causes weak or thin bones (osteoporosis).  Predict your risk of a broken bone (fracture).  Determine how well osteoporosis treatment is working. LET [facility] CARE PROVIDER KNOW ABOUT:  Any allergies you have.  All medicines you are taking, including vitamins, herbs, eye drops, creams, and over-the-counter medicines.  Previous problems you or members of your family have had with the use of anesthetics.  Any blood disorders you have.  Previous surgeries you have had.  Medical conditions you have.  Possibility of pregnancy.  Any other medical test you had within the previous 14 days that used contrast material. RISKS AND COMPLICATIONS Generally, this is a safe procedure. However, problems can occur and may  include the following:  This test exposes you to a very small amount of radiation.  The risks of radiation exposure may be greater to unborn children. BEFORE THE PROCEDURE  Do not take any calcium supplements for 24 hours before having the test. You can otherwise eat and drink what you usually do.  Take off all metal jewelry, eyeglasses, dental appliances, and any other metal objects. PROCEDURE  You may lie on an exam table. There will be an X-ray generator below you and an imaging device above you.  Other devices, such as boxes or braces, may be used to position your body properly for the scan.  You will need to lie still while the machine slowly scans your body.  The images will show up on a computer monitor. AFTER THE PROCEDURE You may need more testing at a later time.   This information is not intended to replace advice given to you by your health care provider. Make sure you discuss any questions you have with your health care provider.   Document Released: 06/27/2004 Document Revised: 06/26/2014 Document Reviewed: 11/13/2013 Elsevier Interactive Patient Education [employer].

## 2016-03-31 NOTE — Progress Notes (Signed)
Subjective:   [name] is a 82 y.o. female who presents for Medicare Annual (Subsequent) preventive examination.  Review of Systems:  No ROS.  Medicare Wellness Visit.  Cardiac Risk Factors include: advanced age (>1men, >63 women);hypertension     Objective:     Vitals: BP 122/62 (BP Location: Left Arm, Patient Position: Sitting, Cuff Size: Normal)   Pulse 60   Temp 97.9 F (36.6 C) (Oral)   Resp 12   Ht 4' 10.5" (1.486 m)   Wt 130 lb 1.9 oz (59 kg)   SpO2 96%   BMI 26.73 kg/m   Body mass index is 26.73 kg/m.   Tobacco History  Smoking Status  . Never Smoker  Smokeless Tobacco  . Never Used     Counseling given: Not Answered   Past Medical History:  Diagnosis Date  . Hyperlipidemia   . Hypertension    Past Surgical History:  Procedure Laterality Date  . ABDOMINAL HYSTERECTOMY    . ankle     rods in both ankles  . BRAIN SURGERY    . CARDIAC CATHETERIZATION     [facility]  . left ankle  Octo 2012   s/p pinning , [place]razinski  . LEFT HEART CATHETERIZATION WITH CORONARY ANGIOGRAM N/A 10/31/2012   Procedure: LEFT HEART CATHETERIZATION WITH CORONARY ANGIOGRAM;  Surgeon: Clent Demark, MD;  Location: [facility] CATH LAB;  Service: Cardiovascular;  Laterality: N/A;   Family History  Problem Relation Age of Onset  . Hypertension Mother   . Heart attack Mother   . Hypertension Father   . Cancer Father     stomach  . Heart attack Brother   . Stroke Sister    History  Sexual Activity  . Sexual activity: Not Currently    Outpatient Encounter Prescriptions as of 03/31/2016  Medication Sig  . amLODipine (NORVASC) 5 MG tablet TAKE 1 TABLET BY MOUTH TWICE DAILY  . aspirin 81 MG tablet Take 81 mg by mouth daily.    . Calcium Carbonate-Vitamin D (CALCIUM + D) 600-200 MG-UNIT TABS Take by mouth.    . fish oil-omega-3 fatty acids 1000 MG capsule Take by mouth daily.    Marland Kitchen lisinopril-hydrochlorothiazide (PRINZIDE,ZESTORETIC) 20-25 MG tablet TAKE ONE TABLET BY MOUTH EVERY  DAY  . metoprolol succinate (TOPROL-XL) 25 MG 24 hr tablet TAKE ONE TABLET BY MOUTH EVERY DAY  . Multiple Vitamin (MULTIVITAMIN) tablet Take 1 tablet by mouth daily.    . [DISCONTINUED] niacin 500 MG tablet Take 500 mg by mouth daily with breakfast.     No facility-administered encounter medications on file as of 03/31/2016.     Activities of Daily Living In your present state of health, do you have any difficulty performing the following activities: 03/31/2016  Hearing? N  Vision? N  Difficulty concentrating or making decisions? Y  Walking or climbing stairs? Y  Dressing or bathing? N  Doing errands, shopping? N  Preparing Food and eating ? N  Using the Toilet? N  In the past six months, have you accidently leaked urine? N  Do you have problems with loss of bowel control? N  Managing your Medications? N  Managing your Finances? N  Housekeeping or managing your Housekeeping? N  Some recent data might be hidden    Patient Care Team: Crecencio Mc, MD as PCP - General (Internal Medicine)    Assessment:    This is a routine wellness examination for [name]. The goal of the wellness visit is to assist the  patient how to close the gaps in care and create a preventative care plan for the patient.   Taking calcium VIT D as appropriate/Osteoporosis risk reviewed.  DEXA Scan discussed. Educational material provided.  Medications reviewed; taking without issues or barriers.  Safety issues reviewed; lives with husband.  Alarm system with smoke detectors in the home. No firearms in the home. Wears seatbelts when driving or riding with others. No violence in the home.  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  No identified risk were noted; The patient was oriented x 3; appropriate in dress and manner and no objective failures at ADL's or IADL's.   Body mas index; discussed the importance of a healthy diet, water intake and exercise. Educational  material provided.  Patient Concerns: Letter of current wt/BMI/ht/age with goal and CDC recommendations requested for TOPS program.   Printed and given to patient.     Exercise Activities and Dietary recommendations Current Exercise Habits: Home exercise routine, Type of exercise: walking, Time (Minutes): 45, Frequency (Times/Week): 3, Weekly Exercise (Minutes/Week): 135, Intensity: Mild  Goals    . Increase physical activity          Stay hydrated and drink plenty of fluids. Stay active and continue walking regimen. Core strengthening exercises.  Low carb foods.  Lean meats and vegetables.        Fall Risk Fall Risk  03/31/2016 03/31/2015 03/23/2014 03/19/2013  Falls in the past year? No No No No   Depression Screen PHQ 2/9 Scores 03/31/2016 03/31/2015 03/23/2014 03/19/2013  PHQ - 2 Score 0 0 0 0     Cognitive Testing MMSE - Mini Mental State Exam 03/31/2016 03/31/2015  Orientation to time 5 5  Orientation to Place 5 5  Registration 3 3  Attention/ Calculation 5 5  Recall 3 3  Language- name 2 objects 2 2  Language- repeat 1 1  Language- follow 3 step command 3 3  Language- read & follow direction 1 1  Write a sentence 1 1  Copy design 1 1  Total score 30 30    Immunization History  Administered Date(s) Administered  . Influenza Split 03/20/2011, 03/13/2012, 03/09/2014  . Influenza,inj,Quad PF,36+ Mos 03/31/2015  . Influenza-Unspecified 03/14/2013, 03/07/2014, 03/20/2016  . Pneumococcal Conjugate-13 03/23/2014  . Pneumococcal Polysaccharide-23 03/19/2010  . Tdap 04/01/2013  . Zoster 03/20/2011   Screening Tests Health Maintenance  Topic Date Due  . DEXA SCAN  10/29/1989  . TETANUS/TDAP  04/02/2023  . INFLUENZA VACCINE  Completed  . ZOSTAVAX  Completed  . PNA vac Low Risk Adult  Completed      Plan:    End of life planning; Advance aging; Advanced directives discussed. Copy of current HCPOA/Living Will requested.  Medicare Attestation I have personally  reviewed: The patient's medical and social history Their use of alcohol, tobacco or illicit drugs Their current medications and supplements The patient's functional ability including ADLs,fall risks, home safety risks, cognitive, and hearing and visual impairment Diet and physical activities Evidence for depression   The patient's weight, height, BMI, and visual acuity have been recorded in the chart.  I have made referrals and provided education to the patient based on review of the above and I have provided the patient with a written personalized care plan for preventive services.    During the course of the visit the patient was educated and counseled about the following appropriate screening and preventive services:   Vaccines to include Pneumoccal, Influenza,  Hepatitis B, Td, Zostavax, HCV  Electrocardiogram  Cardiovascular Disease  Colorectal cancer screening  Bone density screening  Diabetes screening  Glaucoma screening  Mammography/PAP  Nutrition counseling   Patient Instructions (the written plan) was given to the patient.   Varney Biles, LPN  [phone]

## 2016-04-03 ENCOUNTER — Other Ambulatory Visit: Payer: Self-pay | Admitting: Internal Medicine

## 2016-04-03 NOTE — Telephone Encounter (Signed)
No labs and no OV since 04/07/15

## 2016-04-07 NOTE — Progress Notes (Signed)
  I have reviewed the above information and agree with above.   Merridith Dershem, MD 

## 2016-04-10 DIAGNOSIS — H2513 Age-related nuclear cataract, bilateral: Secondary | ICD-10-CM | POA: Diagnosis not present

## 2016-04-17 ENCOUNTER — Telehealth: Payer: Self-pay

## 2016-04-17 DIAGNOSIS — R5383 Other fatigue: Secondary | ICD-10-CM

## 2016-04-17 DIAGNOSIS — E559 Vitamin D deficiency, unspecified: Secondary | ICD-10-CM

## 2016-04-17 DIAGNOSIS — E782 Mixed hyperlipidemia: Secondary | ICD-10-CM

## 2016-04-17 NOTE — Telephone Encounter (Signed)
Pt coming for fasting labs 04/18/16. Please place future orders. Thank you.

## 2016-04-18 ENCOUNTER — Other Ambulatory Visit (INDEPENDENT_AMBULATORY_CARE_PROVIDER_SITE_OTHER): Payer: Medicare Other

## 2016-04-18 DIAGNOSIS — E782 Mixed hyperlipidemia: Secondary | ICD-10-CM | POA: Diagnosis not present

## 2016-04-18 DIAGNOSIS — E559 Vitamin D deficiency, unspecified: Secondary | ICD-10-CM

## 2016-04-18 DIAGNOSIS — R5383 Other fatigue: Secondary | ICD-10-CM | POA: Diagnosis not present

## 2016-04-18 LAB — COMPREHENSIVE METABOLIC PANEL
ALT: 13 U/L (ref 0–35)
AST: 19 U/L (ref 0–37)
Albumin: 4.1 g/dL (ref 3.5–5.2)
Alkaline Phosphatase: 56 U/L (ref 39–117)
BILIRUBIN TOTAL: 0.8 mg/dL (ref 0.2–1.2)
BUN: 21 mg/dL (ref 6–23)
CALCIUM: 10.1 mg/dL (ref 8.4–10.5)
CHLORIDE: 101 meq/L (ref 96–112)
CO2: 29 meq/L (ref 19–32)
CREATININE: 0.93 mg/dL (ref 0.40–1.20)
GFR: 60 mL/min — ABNORMAL LOW (ref >60.00)
Glucose, Bld: 92 mg/dL (ref 70–99)
Potassium: 3.7 mEq/L (ref 3.5–5.1)
SODIUM: 139 meq/L (ref 135–145)
Total Protein: 7.3 g/dL (ref 6.0–8.3)

## 2016-04-18 LAB — CBC WITH DIFFERENTIAL/PLATELET
BASOS ABS: 0 10*3/uL (ref 0.0–0.1)
Basophils Relative: 0.4 % (ref 0.0–3.0)
EOS ABS: 0.2 10*3/uL (ref 0.0–0.7)
Eosinophils Relative: 2.8 % (ref 0.0–5.0)
HCT: 44.4 % (ref 36.0–46.0)
HEMOGLOBIN: 15.1 g/dL — AB (ref 12.0–15.0)
LYMPHS ABS: 1.7 10*3/uL (ref 0.7–4.0)
Lymphocytes Relative: 22.4 % (ref 12.0–46.0)
MCHC: 34 g/dL (ref 30.0–36.0)
MCV: 91.6 fl (ref 78.0–100.0)
MONO ABS: 0.8 10*3/uL (ref 0.1–1.0)
Monocytes Relative: 10.5 % (ref 3.0–12.0)
NEUTROS PCT: 63.9 % (ref 43.0–77.0)
Neutro Abs: 4.7 10*3/uL (ref 1.4–7.7)
Platelets: 213 10*3/uL (ref 150.0–400.0)
RBC: 4.85 Mil/uL (ref 3.87–5.11)
RDW: 13.6 % (ref 11.5–15.5)
WBC: 7.4 10*3/uL (ref 4.0–10.5)

## 2016-04-18 LAB — TSH: TSH: 3.8 u[IU]/mL (ref 0.35–4.50)

## 2016-04-18 LAB — LIPID PANEL
CHOL/HDL RATIO: 4
Cholesterol: 272 mg/dL — ABNORMAL HIGH (ref 0–200)
HDL: 63.6 mg/dL (ref >39.00)
LDL CALC: 185 mg/dL — AB (ref 0–99)
NONHDL: 207.94
Triglycerides: 117 mg/dL (ref 0.0–149.0)
VLDL: 23.4 mg/dL (ref 0.0–40.0)

## 2016-04-18 LAB — VITAMIN D 25 HYDROXY (VIT D DEFICIENCY, FRACTURES): VITD: 53.43 ng/mL (ref 30.00–100.00)

## 2016-04-26 ENCOUNTER — Encounter: Payer: Self-pay | Admitting: Internal Medicine

## 2016-04-26 ENCOUNTER — Ambulatory Visit (INDEPENDENT_AMBULATORY_CARE_PROVIDER_SITE_OTHER): Payer: Medicare Other | Admitting: Internal Medicine

## 2016-04-26 VITALS — BP 127/68 | HR 57 | Temp 97.5°F | Resp 12 | Ht 58.5 in | Wt 131.0 lb

## 2016-04-26 DIAGNOSIS — E782 Mixed hyperlipidemia: Secondary | ICD-10-CM | POA: Diagnosis not present

## 2016-04-26 DIAGNOSIS — Z0000 Encounter for general adult medical examination without abnormal findings: Secondary | ICD-10-CM | POA: Diagnosis not present

## 2016-04-26 DIAGNOSIS — Z1239 Encounter for other screening for malignant neoplasm of breast: Secondary | ICD-10-CM

## 2016-04-26 DIAGNOSIS — I10 Essential (primary) hypertension: Secondary | ICD-10-CM

## 2016-04-26 DIAGNOSIS — Z1231 Encounter for screening mammogram for malignant neoplasm of breast: Secondary | ICD-10-CM | POA: Diagnosis not present

## 2016-04-26 MED ORDER — LISINOPRIL 40 MG PO TABS: 40.0000 mg | ORAL_TABLET | Freq: Every day | ORAL | 0 refills | Status: DC

## 2016-04-26 NOTE — Patient Instructions (Addendum)
We are gong to change your blood pressure medication to get rid of the hctz because it may be making your incontinence worse.   You will still take lisinopril  But at a higher dose of 40 mg daily,    along with your amlodipine   Menopause is a normal process in which your reproductive ability comes to an end. This process happens gradually over a span of months to years, usually between the ages of 16 and 109. Menopause is complete when you have missed 12 consecutive menstrual periods. It is important to talk with your health care provider about some of the most common conditions that affect postmenopausal women, such as heart disease, cancer, and bone loss (osteoporosis). Adopting a healthy lifestyle and getting preventive care can help to promote your health and wellness. Those actions can also lower your chances of developing some of these common conditions. WHAT SHOULD I KNOW ABOUT MENOPAUSE? During menopause, you may experience a number of symptoms, such as:  Moderate-to-severe hot flashes.  Night sweats.  Decrease in sex drive.  Mood swings.  Headaches.  Tiredness.  Irritability.  Memory problems.  Insomnia. Choosing to treat or not to treat menopausal changes is an individual decision that you make with your health care provider. WHAT SHOULD I KNOW ABOUT HORMONE REPLACEMENT THERAPY AND SUPPLEMENTS? Hormone therapy products are effective for treating symptoms that are associated with menopause, such as hot flashes and night sweats. Hormone replacement carries certain risks, especially as you become older. If you are thinking about using estrogen or estrogen with progestin treatments, discuss the benefits and risks with your health care provider. WHAT SHOULD I KNOW ABOUT HEART DISEASE AND STROKE? Heart disease, heart attack, and stroke become more likely as you age. This may be due, in part, to the hormonal changes that your body experiences during menopause. These can affect how  your body processes dietary fats, triglycerides, and cholesterol. Heart attack and stroke are both medical emergencies. There are many things that you can do to help prevent heart disease and stroke:  Have your blood pressure checked at least every 1-2 years. High blood pressure causes heart disease and increases the risk of stroke.  If you are 5-65 years old, ask your health care provider if you should take aspirin to prevent a heart attack or a stroke.  Do not use any tobacco products, including cigarettes, chewing tobacco, or electronic cigarettes. If you need help quitting, ask your health care provider.  It is important to eat a healthy diet and maintain a healthy weight.  Be sure to include plenty of vegetables, fruits, low-fat dairy products, and lean protein.  Avoid eating foods that are high in solid fats, added sugars, or salt (sodium).  Get regular exercise. This is one of the most important things that you can do for your health.  Try to exercise for at least 150 minutes each week. The type of exercise that you do should increase your heart rate and make you sweat. This is known as moderate-intensity exercise.  Try to do strengthening exercises at least twice each week. Do these in addition to the moderate-intensity exercise.  Know your numbers.Ask your health care provider to check your cholesterol and your blood glucose. Continue to have your blood tested as directed by your health care provider. WHAT SHOULD I KNOW ABOUT CANCER SCREENING? There are several types of cancer. Take the following steps to reduce your risk and to catch any cancer development as early as possible. Breast  Cancer  Practice breast self-awareness.  This means understanding how your breasts normally appear and feel.  It also means doing regular breast self-exams. Let your health care provider know about any changes, no matter how small.  If you are 40 or older, have a clinician do a breast exam  (clinical breast exam or CBE) every year. Depending on your age, family history, and medical history, it may be recommended that you also have a yearly breast X-ray (mammogram).  If you have a family history of breast cancer, talk with your health care provider about genetic screening.  If you are at high risk for breast cancer, talk with your health care provider about having an MRI and a mammogram every year.  Breast cancer (BRCA) gene test is recommended for women who have family members with BRCA-related cancers. Results of the assessment will determine the need for genetic counseling and BRCA1 and for BRCA2 testing. BRCA-related cancers include these types:  Breast. This occurs in males or females.  Ovarian.  Tubal. This may also be called fallopian tube cancer.  Cancer of the abdominal or pelvic lining (peritoneal cancer).  Prostate.  Pancreatic. Cervical, Uterine, and Ovarian Cancer Your health care provider may recommend that you be screened regularly for cancer of the pelvic organs. These include your ovaries, uterus, and vagina. This screening involves a pelvic exam, which includes checking for microscopic changes to the surface of your cervix (Pap test).  For women ages 21-65, health care providers may recommend a pelvic exam and a Pap test every three years. For women ages 41-65, they may recommend the Pap test and pelvic exam, combined with testing for human papilloma virus (HPV), every five years. Some types of HPV increase your risk of cervical cancer. Testing for HPV may also be done on women of any age who have unclear Pap test results.  Other health care providers may not recommend any screening for nonpregnant women who are considered low risk for pelvic cancer and have no symptoms. Ask your health care provider if a screening pelvic exam is right for you.  If you have had past treatment for cervical cancer or a condition that could lead to cancer, you need Pap tests and  screening for cancer for at least 20 years after your treatment. If Pap tests have been discontinued for you, your risk factors (such as having a new sexual partner) need to be reassessed to determine if you should start having screenings again. Some women have medical problems that increase the chance of getting cervical cancer. In these cases, your health care provider may recommend that you have screening and Pap tests more often.  If you have a family history of uterine cancer or ovarian cancer, talk with your health care provider about genetic screening.  If you have vaginal bleeding after reaching menopause, tell your health care provider.  There are currently no reliable tests available to screen for ovarian cancer. Lung Cancer Lung cancer screening is recommended for adults 74-36 years old who are at high risk for lung cancer because of a history of smoking. A yearly low-dose CT scan of the lungs is recommended if you:  Currently smoke.  Have a history of at least 30 pack-years of smoking and you currently smoke or have quit within the past 15 years. A pack-year is smoking an average of one pack of cigarettes per day for one year. Yearly screening should:  Continue until it has been 15 years since you quit.  Stop  if you develop a health problem that would prevent you from having lung cancer treatment. Colorectal Cancer  This type of cancer can be detected and can often be prevented.  Routine colorectal cancer screening usually begins at age 18 and continues through age 65.  If you have risk factors for colon cancer, your health care provider may recommend that you be screened at an earlier age.  If you have a family history of colorectal cancer, talk with your health care provider about genetic screening.  Your health care provider may also recommend using home test kits to check for hidden blood in your stool.  A small camera at the end of a tube can be used to examine your  colon directly (sigmoidoscopy or colonoscopy). This is done to check for the earliest forms of colorectal cancer.  Direct examination of the colon should be repeated every 5-10 years until age 71. However, if early forms of precancerous polyps or small growths are found or if you have a family history or genetic risk for colorectal cancer, you may need to be screened more often. Skin Cancer  Check your skin from head to toe regularly.  Monitor any moles. Be sure to tell your health care provider:  About any new moles or changes in moles, especially if there is a change in a mole's shape or color.  If you have a mole that is larger than the size of a pencil eraser.  If any of your family members has a history of skin cancer, especially at a young age, talk with your health care provider about genetic screening.  Always use sunscreen. Apply sunscreen liberally and repeatedly throughout the day.  Whenever you are outside, protect yourself by wearing long sleeves, pants, a wide-brimmed hat, and sunglasses. WHAT SHOULD I KNOW ABOUT OSTEOPOROSIS? Osteoporosis is a condition in which bone destruction happens more quickly than new bone creation. After menopause, you may be at an increased risk for osteoporosis. To help prevent osteoporosis or the bone fractures that can happen because of osteoporosis, the following is recommended:  If you are 75-35 years old, get at least 1,000 mg of calcium and at least 600 mg of vitamin D per day.  If you are older than age 39 but younger than age 20, get at least 1,200 mg of calcium and at least 600 mg of vitamin D per day.  If you are older than age 84, get at least 1,200 mg of calcium and at least 800 mg of vitamin D per day. Smoking and excessive alcohol intake increase the risk of osteoporosis. Eat foods that are rich in calcium and vitamin D, and do weight-bearing exercises several times each week as directed by your health care provider. WHAT SHOULD I  KNOW ABOUT HOW MENOPAUSE AFFECTS [place]? Depression may occur at any age, but it is more common as you become older. Common symptoms of depression include:  Low or sad mood.  Changes in sleep patterns.  Changes in appetite or eating patterns.  Feeling an overall lack of motivation or enjoyment of activities that you previously enjoyed.  Frequent crying spells. Talk with your health care provider if you think that you are experiencing depression. WHAT SHOULD I KNOW ABOUT IMMUNIZATIONS? It is important that you get and maintain your immunizations. These include:  Tetanus, diphtheria, and pertussis (Tdap) booster vaccine.  Influenza every year before the flu season begins.  Pneumonia vaccine.  Shingles vaccine. Your health care provider may also recommend other  immunizations.   This information is not intended to replace advice given to you by your health care provider. Make sure you discuss any questions you have with your health care provider.   Document Released: 07/28/2005 Document Revised: 06/26/2014 Document Reviewed: 02/05/2014 Elsevier Interactive Patient Education [employer].

## 2016-04-26 NOTE — Progress Notes (Signed)
Pre-visit discussion using our clinic review tool. No additional management support is needed unless otherwise documented below in the visit note.  

## 2016-04-26 NOTE — Progress Notes (Signed)
Subjective:  Patient ID: [name], female    DOB: 01-06-1923  Age: 82 y.o. MRN: [id number]  CC: The primary encounter diagnosis was Screening for breast cancer. Diagnoses of Routine general medical examination at a health care facility, Mixed hyperlipidemia, and Essential hypertension were also pertinent to this visit.  HPI Cheyne I Reindel presents for follow uo on hypertension, hyperlipidemia   No falls.  Still driving,  No accidents..  Not hard  of hearing,  No dentures!  Eye exam normal lalst week except for ealry cataracts bilaterally   Trouble sleeping due to husband's health issues .  Moving bowles daily .  Stress/urge   incontinence , managed with wearing a  Mini pad   Lives at [facility] ,  Has use of the gym  Uses the treadmill,  Walks at the mall also,  Rides the stationery bike does not use swimming pool   Outpatient Medications Prior to Visit  Medication Sig Dispense Refill  . amLODipine (NORVASC) 5 MG tablet TAKE 1 TABLET BY MOUTH TWICE DAILY 60 tablet 2  . aspirin 81 MG tablet Take 81 mg by mouth daily.      . Calcium Carbonate-Vitamin D (CALCIUM + D) 600-200 MG-UNIT TABS Take by mouth.      . fish oil-omega-3 fatty acids 1000 MG capsule Take by mouth daily.      . metoprolol succinate (TOPROL-XL) 25 MG 24 hr tablet TAKE ONE TABLET BY MOUTH EVERY DAY 30 tablet 0  . Multiple Vitamin (MULTIVITAMIN) tablet Take 1 tablet by mouth daily.      Marland Kitchen lisinopril-hydrochlorothiazide (PRINZIDE,ZESTORETIC) 20-25 MG tablet TAKE ONE TABLET BY MOUTH EVERY DAY 90 tablet 0   No facility-administered medications prior to visit.     Review of Systems;  Patient denies headache, fevers, malaise, unintentional weight loss, skin rash, eye pain, sinus congestion and sinus pain, sore throat, dysphagia,  hemoptysis , cough, dyspnea, wheezing, chest pain, palpitations, orthopnea, edema, abdominal pain, nausea, melena, diarrhea, constipation, flank pain, dysuria, hematuria, urinary  Frequency,  nocturia, numbness, tingling, seizures,  Focal weakness, Loss of consciousness,  Tremor, insomnia, depression, anxiety, and suicidal ideation.      Objective:  BP 127/68   Pulse (!) 57   Temp 97.5 F (36.4 C) (Oral)   Resp 12   Ht 4' 10.5" (1.486 m)   Wt 131 lb (59.4 kg)   SpO2 97%   BMI 26.91 kg/m   BP Readings from Last 3 Encounters:  04/26/16 127/68  03/31/16 122/62  12/06/15 110/60    Wt Readings from Last 3 Encounters:  04/26/16 131 lb (59.4 kg)  03/31/16 130 lb 1.9 oz (59 kg)  12/06/15 131 lb 12 oz (59.8 kg)    General appearance: alert, cooperative and appears stated age Ears: normal TM's and external ear canals both ears Throat: lips, mucosa, and tongue normal; teeth and gums normal Neck: no adenopathy, no carotid bruit, supple, symmetrical, trachea midline and thyroid not enlarged, symmetric, no tenderness/mass/nodules Back: symmetric, no curvature. ROM normal. No CVA tenderness. Lungs: clear to auscultation bilaterally Heart: regular rate and rhythm, S1, S2 normal, no murmur, click, rub or gallop Abdomen: soft, non-tender; bowel sounds normal; no masses,  no organomegaly Pulses: 2+ and symmetric Skin: Skin color, texture, turgor normal. No rashes or lesions Lymph nodes: Cervical, supraclavicular, and axillary nodes normal.  No results found for: HGBA1C  Lab Results  Component Value Date   CREATININE 0.93 04/18/2016   CREATININE 0.96 04/12/2015   CREATININE  0.84 04/07/2015    Lab Results  Component Value Date   WBC 7.4 04/18/2016   HGB 15.1 (H) 04/18/2016   HCT 44.4 04/18/2016   PLT 213.0 04/18/2016   GLUCOSE 92 04/18/2016   CHOL 272 (H) 04/18/2016   TRIG 117.0 04/18/2016   HDL 63.60 04/18/2016   LDLDIRECT 159.0 04/07/2015   LDLCALC 185 (H) 04/18/2016   ALT 13 04/18/2016   AST 19 04/18/2016   NA 139 04/18/2016   K 3.7 04/18/2016   CL 101 04/18/2016   CREATININE 0.93 04/18/2016   BUN 21 04/18/2016   CO2 29 04/18/2016   TSH 3.80 04/18/2016      No results found.  Assessment & Plan:   Problem List Items Addressed This Visit    Screening for breast cancer - Primary    Discussed the appropriate use of mammogram for breast cancer screening in women her age, She continues to request  Screening  annually at this point.       Relevant Orders   MM Digital Screening   Mixed hyperlipidemia    She did not tolerate pravastatin due to joint pain.  Lipids are unchanged,   Lab Results  Component Value Date   CHOL 272 (H) 04/18/2016   HDL 63.60 04/18/2016   LDLCALC 185 (H) 04/18/2016   LDLDIRECT 159.0 04/07/2015   TRIG 117.0 04/18/2016   CHOLHDL 4 04/18/2016         Relevant Medications   lisinopril (PRINIVIL,ZESTRIL) 40 MG tablet   Hypertension    Well controlled on current regimen. Renal function stable, no changes today.  Lab Results  Component Value Date   NA 139 04/18/2016   K 3.7 04/18/2016   CL 101 04/18/2016   CO2 29 04/18/2016   Lab Results  Component Value Date   CREATININE 0.93 04/18/2016         Relevant Medications   lisinopril (PRINIVIL,ZESTRIL) 40 MG tablet   Routine general medical examination at a health care facility    Annual comprehensive preventive exam was done as well as an evaluation and management of chronic conditions .  During the course of the visit the patient was educated and counseled about appropriate screening and preventive services including :  diabetes screening, lipid analysis with projected  10 year  risk for CAD , nutrition counseling, breast, cervical and colorectal cancer screening, and recommended immunizations.  Printed recommendations for health maintenance screenings was given       A total of 25 minutes was spent with patient more than half of which was spent in counseling patient on the above mentioned issues , reviewing and explaining recent labs and imaging studies done, and coordination of care.  I have discontinued Ms. Mczeal's lisinopril-hydrochlorothiazide. I am  also having her start on lisinopril. Additionally, I am having her maintain her fish oil-omega-3 fatty acids, Calcium Carbonate-Vitamin D, multivitamin, aspirin, amLODipine, and metoprolol succinate.  Meds ordered this encounter  Medications  . lisinopril (PRINIVIL,ZESTRIL) 40 MG tablet    Sig: Take 1 tablet (40 mg total) by mouth daily.    Dispense:  30 tablet    Refill:  0    Medications Discontinued During This Encounter  Medication Reason  . lisinopril-hydrochlorothiazide (PRINZIDE,ZESTORETIC) 20-25 MG tablet     Follow-up: No Follow-up on file.   Crecencio Mc, MD

## 2016-04-28 NOTE — Assessment & Plan Note (Signed)
She did not tolerate pravastatin due to joint pain.  Lipids are unchanged,   Lab Results  Component Value Date   CHOL 272 (H) 04/18/2016   HDL 63.60 04/18/2016   LDLCALC 185 (H) 04/18/2016   LDLDIRECT 159.0 04/07/2015   TRIG 117.0 04/18/2016   CHOLHDL 4 04/18/2016

## 2016-04-28 NOTE — Assessment & Plan Note (Signed)
Annual comprehensive preventive exam was done as well as an evaluation and management of chronic conditions .  During the course of the visit the patient was educated and counseled about appropriate screening and preventive services including :  diabetes screening, lipid analysis with projected  10 year  risk for CAD , nutrition counseling, breast, cervical and colorectal cancer screening, and recommended immunizations.  Printed recommendations for health maintenance screenings was given 

## 2016-04-28 NOTE — Assessment & Plan Note (Signed)
Well controlled on current regimen. Renal function stable, no changes today.  Lab Results  Component Value Date   NA 139 04/18/2016   K 3.7 04/18/2016   CL 101 04/18/2016   CO2 29 04/18/2016   Lab Results  Component Value Date   CREATININE 0.93 04/18/2016

## 2016-04-29 DIAGNOSIS — Z1239 Encounter for other screening for malignant neoplasm of breast: Secondary | ICD-10-CM | POA: Insufficient documentation

## 2016-04-29 NOTE — Assessment & Plan Note (Signed)
Discussed the appropriate use of mammogram for breast cancer screening in women her age, She continues to request  Screening  annually at this point.

## 2016-05-01 ENCOUNTER — Other Ambulatory Visit: Payer: Self-pay | Admitting: Internal Medicine

## 2016-05-20 ENCOUNTER — Other Ambulatory Visit: Payer: Self-pay | Admitting: Internal Medicine

## 2016-05-26 DIAGNOSIS — M5441 Lumbago with sciatica, right side: Secondary | ICD-10-CM | POA: Diagnosis not present

## 2016-05-26 DIAGNOSIS — M545 Low back pain: Secondary | ICD-10-CM | POA: Diagnosis not present

## 2016-06-02 ENCOUNTER — Ambulatory Visit
Admission: RE | Admit: 2016-06-02 | Discharge: 2016-06-02 | Disposition: A | Discharge status: Home / Self Care | Payer: Medicare Other | Source: Ambulatory Visit | Attending: Internal Medicine | Admitting: Internal Medicine

## 2016-06-02 DIAGNOSIS — Z1231 Encounter for screening mammogram for malignant neoplasm of breast: Secondary | ICD-10-CM | POA: Insufficient documentation

## 2016-06-02 DIAGNOSIS — Z1239 Encounter for other screening for malignant neoplasm of breast: Secondary | ICD-10-CM

## 2016-06-08 ENCOUNTER — Ambulatory Visit (INDEPENDENT_AMBULATORY_CARE_PROVIDER_SITE_OTHER): Payer: Medicare Other | Admitting: Cardiovascular Disease

## 2016-06-08 ENCOUNTER — Encounter: Payer: Self-pay | Admitting: Cardiovascular Disease

## 2016-06-08 VITALS — BP 110/60 | HR 68 | Ht 61.0 in | Wt 132.5 lb

## 2016-06-08 DIAGNOSIS — I428 Other cardiomyopathies: Secondary | ICD-10-CM | POA: Diagnosis not present

## 2016-06-08 DIAGNOSIS — I5022 Chronic systolic (congestive) heart failure: Secondary | ICD-10-CM | POA: Diagnosis not present

## 2016-06-08 DIAGNOSIS — I10 Essential (primary) hypertension: Secondary | ICD-10-CM

## 2016-06-08 NOTE — Progress Notes (Signed)
Cardiology Office Note   Date:  06/08/2016   ID:  [name], DOB 10/24/1922, MRN [id number]  PCP:  Crecencio Mc, MD  Cardiologist:   Kathlyn Sacramento, MD   Chief Complaint  Patient presents with  . other    6 month f/u no complaints today. Meds reviewed verbally with pt.      History of Present Illness: [name] is a 82 y.o. female who presents for a follow up visit regarding nonischemic cardiomyopathy. She had cardiac catheterization in May 2014 which showed mild nonobstructive coronary artery disease involving the LAD and left circumflex. Ejection fraction was normal.  She does have intermittent left bundle branch block. Echocardiogram in November 2015 showed an ejection fraction of 45-50%, mild-to-moderate aortic regurgitation, mild mitral regurgitation and mild-to-moderate tricuspid regurgitation. Pulmonary pressure was normal. She is doing well overall with no chest pain or significant dyspnea. She is currently living in an apartment after they sold their house. Her husband is also 28 years old but with more medical issues. They have been married for 71 years.  Past Medical History:  Diagnosis Date  . Hyperlipidemia   . Hypertension     Past Surgical History:  Procedure Laterality Date  . ABDOMINAL HYSTERECTOMY    . ankle     rods in both ankles  . BRAIN SURGERY    . BREAST BIOPSY Bilateral YRS AGO   NEG  . CARDIAC CATHETERIZATION     [facility]  . left ankle  Octo 2012   s/p pinning , Krazinski  . LEFT HEART CATHETERIZATION WITH CORONARY ANGIOGRAM N/A 10/31/2012   Procedure: LEFT HEART CATHETERIZATION WITH CORONARY ANGIOGRAM;  Surgeon: Clent Demark, MD;  Location: [facility] CATH LAB;  Service: Cardiovascular;  Laterality: N/A;     Current Outpatient Prescriptions  Medication Sig Dispense Refill  . amLODipine (NORVASC) 5 MG tablet TAKE ONE TABLET BY MOUTH TWICE DAILY 60 tablet 2  . aspirin 81 MG tablet Take 81 mg by mouth daily.      . Calcium  Carbonate-Vitamin D (CALCIUM + D) 600-200 MG-UNIT TABS Take by mouth.      . fish oil-omega-3 fatty acids 1000 MG capsule Take by mouth daily.      Marland Kitchen lisinopril (PRINIVIL,ZESTRIL) 40 MG tablet TAKE ONE TABLET BY MOUTH EVERY DAY 30 tablet 2  . metoprolol succinate (TOPROL-XL) 25 MG 24 hr tablet TAKE 1 TABLET BY MOUTH DAILY 30 tablet 5  . Multiple Vitamin (MULTIVITAMIN) tablet Take 1 tablet by mouth daily.       No current facility-administered medications for this visit.     Allergies:   Sulfa antibiotics and Contrast media [iodinated diagnostic agents]    Social History:  The patient  reports that she has never smoked. She has never used smokeless tobacco. She reports that she does not drink alcohol or use drugs.   Family History:  The patient's family history includes Cancer in her father; Heart attack in her brother and mother; Hypertension in her father and mother; Stroke in her sister.    ROS:  Please see the history of present illness.   Otherwise, review of systems are positive for none.   All other systems are reviewed and negative.    PHYSICAL EXAM: VS:  BP 110/60 (BP Location: Left Arm, Patient Position: Sitting, Cuff Size: Normal)   Pulse 68   Ht 5\' 1"  (1.549 m)   Wt 132 lb 8 oz (60.1 kg)   BMI 25.04 kg/m  ,  BMI Body mass index is 25.04 kg/m. GEN: Well nourished, well developed, in no acute distress HEENT: normal Neck: no JVD, carotid bruits, or masses Cardiac: RRR; no rubs, or gallops,no edema . 1/ 6 systolic murmur in the aortic area Respiratory:  clear to auscultation bilaterally, normal work of breathing GI: soft, nontender, nondistended, + BS MS: no deformity or atrophy Skin: warm and dry, no rash Neuro:  Strength and sensation are intact Psych: euthymic mood, full affect   EKG:  EKG is ordered today. The ekg ordered today demonstrates normal sinus rhythm with left bundle branch block.   Recent Labs: 04/18/2016: ALT 13; BUN 21; Creatinine, Ser 0.93;  Hemoglobin 15.1; Platelets 213.0; Potassium 3.7; Sodium 139; TSH 3.80    Lipid Panel    Component Value Date/Time   CHOL 272 (H) 04/18/2016 0804   CHOL 226 (H) 09/16/2012 0542   TRIG 117.0 04/18/2016 0804   TRIG 109 09/16/2012 0542   HDL 63.60 04/18/2016 0804   HDL 60 09/16/2012 0542   CHOLHDL 4 04/18/2016 0804   VLDL 23.4 04/18/2016 0804   VLDL 22 09/16/2012 0542   LDLCALC 185 (H) 04/18/2016 0804   LDLCALC 144 (H) 09/16/2012 0542   LDLDIRECT 159.0 04/07/2015 0957      Wt Readings from Last 3 Encounters:  06/08/16 132 lb 8 oz (60.1 kg)  04/26/16 131 lb (59.4 kg)  03/31/16 130 lb 1.9 oz (59 kg)        ASSESSMENT AND PLAN:  1.  Chronic systolic heart failure: Due to nonischemic cardiomyopathy. Ejection fraction was 45-50%. Currently New York Heart Association class II. No evidence of volume overload. Continue  Current medications. I reviewed her labs from October which overall were unremarkable.  2. Essential hypertension: Blood pressure is under excellent control on current medications.  3. Hyperlipidemia: I reviewed her lipid profile with her which showed an LDL of 185. Considering her age and no significant documented atherosclerosis, I doubt that she would benefit from treatment with a statin.  Disposition:   FU with me in 6 months  Signed,  Kathlyn Sacramento, MD  06/08/2016 2:02 PM    [place]

## 2016-06-08 NOTE — Patient Instructions (Signed)
Medication Instructions: Continue same medications.   Labwork: None.   Procedures/Testing: None.   Follow-Up: 6 months with Dr. Moussa Wiegand.   Any Additional Special Instructions Will Be Listed Below (If Applicable).     If you need a refill on your cardiac medications before your next appointment, please call your pharmacy.   

## 2016-06-17 DIAGNOSIS — M25562 Pain in left knee: Secondary | ICD-10-CM | POA: Diagnosis not present

## 2016-06-17 DIAGNOSIS — S8992XA Unspecified injury of left lower leg, initial encounter: Secondary | ICD-10-CM | POA: Diagnosis not present

## 2016-06-26 DIAGNOSIS — M25562 Pain in left knee: Secondary | ICD-10-CM | POA: Diagnosis not present

## 2016-07-04 DIAGNOSIS — M76822 Posterior tibial tendinitis, left leg: Secondary | ICD-10-CM | POA: Diagnosis not present

## 2016-07-22 ENCOUNTER — Other Ambulatory Visit: Payer: Self-pay | Admitting: Internal Medicine

## 2016-07-26 DIAGNOSIS — M545 Low back pain: Secondary | ICD-10-CM | POA: Diagnosis not present

## 2016-09-14 DIAGNOSIS — M79621 Pain in right upper arm: Secondary | ICD-10-CM | POA: Diagnosis not present

## 2016-09-18 DIAGNOSIS — M79601 Pain in right arm: Secondary | ICD-10-CM | POA: Diagnosis not present

## 2016-09-25 DIAGNOSIS — S46111A Strain of muscle, fascia and tendon of long head of biceps, right arm, initial encounter: Secondary | ICD-10-CM | POA: Diagnosis not present

## 2016-10-20 ENCOUNTER — Telehealth: Payer: Self-pay | Admitting: Radiology

## 2016-10-20 NOTE — Telephone Encounter (Signed)
Pt coming in for labs Monday, please place future orders. Thank you 

## 2016-10-21 ENCOUNTER — Other Ambulatory Visit: Payer: Self-pay | Admitting: Internal Medicine

## 2016-10-23 ENCOUNTER — Telehealth: Payer: Self-pay | Admitting: Radiology

## 2016-10-23 ENCOUNTER — Other Ambulatory Visit: Payer: Medicare Other

## 2016-10-23 DIAGNOSIS — E7800 Pure hypercholesterolemia, unspecified: Secondary | ICD-10-CM

## 2016-10-23 DIAGNOSIS — I10 Essential (primary) hypertension: Secondary | ICD-10-CM

## 2016-10-23 NOTE — Addendum Note (Signed)
Addended by: Crecencio Mc on: 10/23/2016 12:56 PM   Modules accepted: Orders

## 2016-10-23 NOTE — Telephone Encounter (Signed)
2nd note to provider. PT coming in for labs today, please place future orders. Thank you.

## 2016-10-30 ENCOUNTER — Other Ambulatory Visit: Payer: Self-pay | Admitting: Internal Medicine

## 2016-11-21 ENCOUNTER — Other Ambulatory Visit: Payer: Self-pay | Admitting: Internal Medicine

## 2016-11-21 DIAGNOSIS — M25572 Pain in left ankle and joints of left foot: Secondary | ICD-10-CM | POA: Diagnosis not present

## 2016-11-21 DIAGNOSIS — S93402A Sprain of unspecified ligament of left ankle, initial encounter: Secondary | ICD-10-CM | POA: Diagnosis not present

## 2016-11-21 NOTE — Telephone Encounter (Signed)
Lisinopril: refilled 10/23/2016 Amlodipine: refilled 10/23/2016  Last OV: 04/26/2016 Next OV: not scheduled.

## 2016-11-23 NOTE — Telephone Encounter (Signed)
Refilled for 30 days only.  OFFICE VISIT NEEDED prior to any more refills 

## 2016-11-27 NOTE — Telephone Encounter (Signed)
Spoke with pt and was able to schedule her an appt. Pt is aware of appt date and time.

## 2016-11-30 ENCOUNTER — Encounter: Payer: Self-pay | Admitting: Cardiovascular Disease

## 2016-11-30 ENCOUNTER — Ambulatory Visit (INDEPENDENT_AMBULATORY_CARE_PROVIDER_SITE_OTHER): Payer: Medicare Other | Admitting: Cardiovascular Disease

## 2016-11-30 VITALS — BP 132/60 | HR 62 | Ht 60.0 in | Wt 130.2 lb

## 2016-11-30 DIAGNOSIS — I5022 Chronic systolic (congestive) heart failure: Secondary | ICD-10-CM

## 2016-11-30 DIAGNOSIS — I10 Essential (primary) hypertension: Secondary | ICD-10-CM | POA: Diagnosis not present

## 2016-11-30 NOTE — Patient Instructions (Signed)
Medication Instructions: Continue same medications.   Labwork: None.   Procedures/Testing: None.   Follow-Up: 1 year with Dr. Jalila Goodnough.   Any Additional Special Instructions Will Be Listed Below (If Applicable).     If you need a refill on your cardiac medications before your next appointment, please call your pharmacy.   

## 2016-11-30 NOTE — Progress Notes (Signed)
Cardiology Office Note   Date:  11/30/2016   ID:  [name], DOB 18-Apr-1923, MRN [id number]  PCP:  Crecencio Mc, MD  Cardiologist:   Kathlyn Sacramento, MD   Chief Complaint  Patient presents with  . other    6 month follow up. Meds reviewed by the pt. verbally. "doing well."       History of Present Illness: [name] is a 83 y.o. female who presents for a follow up visit regarding nonischemic cardiomyopathy. She had cardiac catheterization in May 2014 which showed mild nonobstructive coronary artery disease involving the LAD and left circumflex. Ejection fraction was normal.  She does have left bundle branch block. Echocardiogram in November 2015 showed an ejection fraction of 45-50%, mild-to-moderate aortic regurgitation, mild mitral regurgitation and mild-to-moderate tricuspid regurgitation. Pulmonary pressure was normal. She has been doing well with no chest pain, shortness of breath or palpitations. Her husband continues to struggle with multiple chronic medical conditions.  Past Medical History:  Diagnosis Date  . Hyperlipidemia   . Hypertension     Past Surgical History:  Procedure Laterality Date  . ABDOMINAL HYSTERECTOMY    . ankle     rods in both ankles  . BRAIN SURGERY    . BREAST BIOPSY Bilateral YRS AGO   NEG  . CARDIAC CATHETERIZATION     [facility]  . left ankle  Octo 2012   s/p pinning , Krazinski  . LEFT HEART CATHETERIZATION WITH CORONARY ANGIOGRAM N/A 10/31/2012   Procedure: LEFT HEART CATHETERIZATION WITH CORONARY ANGIOGRAM;  Surgeon: Clent Demark, MD;  Location: [facility] C[facility] LAB;  Service: Cardiovascular;  Laterality: N/A;     Current Outpatient Prescriptions  Medication Sig Dispense Refill  . amLODipine (NORVASC) 5 MG tablet TAKE ONE TABLET BY MOUTH TWICE DAILY 60 tablet 0  . aspirin 81 MG tablet Take 81 mg by mouth daily.      . Calcium Carbonate-Vitamin D (CALCIUM + D) 600-200 MG-UNIT TABS Take by mouth.      . fish oil-omega-3 fatty acids  1000 MG capsule Take by mouth daily.      Marland Kitchen lisinopril (PRINIVIL,ZESTRIL) 40 MG tablet TAKE ONE TABLET BY MOUTH EVERY DAY 30 tablet 0  . metoprolol succinate (TOPROL-XL) 25 MG 24 hr tablet TAKE ONE TABLET EVERY DAY 30 tablet 3  . Multiple Vitamin (MULTIVITAMIN) tablet Take 1 tablet by mouth daily.       No current facility-administered medications for this visit.     Allergies:   Sulfa antibiotics and Contrast media [iodinated diagnostic agents]    Social History:  The patient  reports that she has never smoked. She has never used smokeless tobacco. She reports that she does not drink alcohol or use drugs.   Family History:  The patient's family history includes Cancer in her father; Heart attack in her brother and mother; Hypertension in her father and mother; Stroke in her sister.    ROS:  Please see the history of present illness.   Otherwise, review of systems are positive for none.   All other systems are reviewed and negative.    PHYSICAL EXAM: VS:  BP 132/60 (BP Location: Left Arm, Patient Position: Sitting, Cuff Size: Normal)   Pulse 62   Ht 5' (1.524 m)   Wt 130 lb 4 oz (59.1 kg)   BMI 25.44 kg/m  , BMI Body mass index is 25.44 kg/m. GEN: Well nourished, well developed, in no acute distress  HEENT: normal  Neck: no JVD, carotid bruits, or masses Cardiac: RRR; no rubs, or gallops,no edema . 1/ 6 systolic murmur in the aortic area Respiratory:  clear to auscultation bilaterally, normal work of breathing GI: soft, nontender, nondistended, + BS MS: no deformity or atrophy  Skin: warm and dry, no rash Neuro:  Strength and sensation are intact Psych: euthymic mood, full affect   EKG:  EKG is ordered today. The ekg ordered today demonstrates normal sinus rhythm with left bundle branch block.   Recent Labs: 04/18/2016: ALT 13; BUN 21; Creatinine, Ser 0.93; Hemoglobin 15.1; Platelets 213.0; Potassium 3.7; Sodium 139; TSH 3.80    Lipid Panel    Component Value  Date/Time   CHOL 272 (H) 04/18/2016 0804   CHOL 226 (H) 09/16/2012 0542   TRIG 117.0 04/18/2016 0804   TRIG 109 09/16/2012 0542   HDL 63.60 04/18/2016 0804   HDL 60 09/16/2012 0542   CHOLHDL 4 04/18/2016 0804   VLDL 23.4 04/18/2016 0804   VLDL 22 09/16/2012 0542   LDLCALC 185 (H) 04/18/2016 0804   LDLCALC 144 (H) 09/16/2012 0542   LDLDIRECT 159.0 04/07/2015 0957      Wt Readings from Last 3 Encounters:  11/30/16 130 lb 4 oz (59.1 kg)  06/08/16 132 lb 8 oz (60.1 kg)  04/26/16 131 lb (59.4 kg)        ASSESSMENT AND PLAN:  1.  Chronic systolic heart failure: Due to nonischemic cardiomyopathy. Ejection fraction was 45-50%. Currently New York Heart Association class II. No evidence of volume overload. Continue  Current medications.  2. Essential hypertension: Blood pressure is well control on current medications.   Disposition:   FU with me in 12 months  Signed,  Kathlyn Sacramento, MD  11/30/2016 2:56 PM    [place]

## 2016-12-04 ENCOUNTER — Ambulatory Visit (INDEPENDENT_AMBULATORY_CARE_PROVIDER_SITE_OTHER): Payer: Medicare Other | Admitting: Internal Medicine

## 2016-12-04 ENCOUNTER — Encounter: Payer: Self-pay | Admitting: Internal Medicine

## 2016-12-04 VITALS — BP 146/66 | HR 55 | Temp 98.3°F | Resp 16 | Ht 60.0 in | Wt 131.4 lb

## 2016-12-04 DIAGNOSIS — E782 Mixed hyperlipidemia: Secondary | ICD-10-CM | POA: Diagnosis not present

## 2016-12-04 DIAGNOSIS — I5022 Chronic systolic (congestive) heart failure: Secondary | ICD-10-CM

## 2016-12-04 DIAGNOSIS — I10 Essential (primary) hypertension: Secondary | ICD-10-CM

## 2016-12-04 DIAGNOSIS — R5383 Other fatigue: Secondary | ICD-10-CM | POA: Diagnosis not present

## 2016-12-04 NOTE — Progress Notes (Signed)
Subjective:  Patient ID: [name], female    DOB: 17-Nov-1922  Age: 83 y.o. MRN: [id number]  CC: The primary encounter diagnosis was Essential hypertension. Diagnoses of Mixed hyperlipidemia, Chronic systolic heart failure ([place]), and Caregiver with fatigue were also pertinent to this visit.  HPI [name] presents for follow up on chronic conditions including hypertension and nonischemic cardiomyopathy.  She has no acute issues today.  Reports some fatigue and anxiety secondary to the stress of caring for her 44 yr old husband of 76 years, who is in declining health.  Not sleeping well.   Had an EKG by [place] last week. Patient questions why she is on so many medications.   for blood pressure.  Reviewed her heart condition and the role of each medication    Outpatient Medications Prior to Visit  Medication Sig Dispense Refill  . amLODipine (NORVASC) 5 MG tablet TAKE ONE TABLET BY MOUTH TWICE DAILY 60 tablet 0  . aspirin 81 MG tablet Take 81 mg by mouth daily.      . Calcium Carbonate-Vitamin D (CALCIUM + D) 600-200 MG-UNIT TABS Take by mouth.      . fish oil-omega-3 fatty acids 1000 MG capsule Take by mouth daily.      Marland Kitchen lisinopril (PRINIVIL,ZESTRIL) 40 MG tablet TAKE ONE TABLET BY MOUTH EVERY DAY 30 tablet 0  . metoprolol succinate (TOPROL-XL) 25 MG 24 hr tablet TAKE ONE TABLET EVERY DAY 30 tablet 3  . Multiple Vitamin (MULTIVITAMIN) tablet Take 1 tablet by mouth daily.       No facility-administered medications prior to visit.     Review of Systems;  Patient denies headache, fevers, malaise, unintentional weight loss, skin rash, eye pain, sinus congestion and sinus pain, sore throat, dysphagia,  hemoptysis , cough, dyspnea, wheezing, chest pain, palpitations, orthopnea, edema, abdominal pain, nausea, melena, diarrhea, constipation, flank pain, dysuria, hematuria, urinary  Frequency, nocturia, numbness, tingling, seizures,  Focal weakness, Loss of consciousness,  Tremor,  insomnia, depression, anxiety, and suicidal ideation.      Objective:  BP (!) 146/66 (BP Location: Left Arm, Patient Position: Sitting, Cuff Size: Normal)   Pulse (!) 55   Temp 98.3 F (36.8 C) (Oral)   Resp 16   Ht 5' (1.524 m)   Wt 131 lb 6.4 oz (59.6 kg)   SpO2 94%   BMI 25.66 kg/m   BP Readings from Last 3 Encounters:  12/04/16 (!) 146/66  11/30/16 132/60  06/08/16 110/60    Wt Readings from Last 3 Encounters:  12/04/16 131 lb 6.4 oz (59.6 kg)  11/30/16 130 lb 4 oz (59.1 kg)  06/08/16 132 lb 8 oz (60.1 kg)    General appearance: alert, cooperative and appears stated age Ears: normal TM's and external ear canals both ears Throat: lips, mucosa, and tongue normal; teeth and gums normal Neck: no adenopathy, no carotid bruit, supple, symmetrical, trachea midline and thyroid not enlarged, symmetric, no tenderness/mass/nodules Back: symmetric, no curvature. ROM normal. No CVA tenderness. Lungs: clear to auscultation bilaterally Heart: regular rate and rhythm, S1, S2 normal, no murmur, click, rub or gallop Abdomen: soft, non-tender; bowel sounds normal; no masses,  no organomegaly Pulses: 2+ and symmetric Skin: Skin color, texture, turgor normal. No rashes or lesions Lymph nodes: Cervical, supraclavicular, and axillary nodes normal.  No results found for: HGBA1C  Lab Results  Component Value Date   CREATININE 0.93 04/18/2016   CREATININE 0.96 04/12/2015   CREATININE 0.84 04/07/2015    Lab Results  Component Value Date   WBC 7.4 04/18/2016   HGB 15.1 (H) 04/18/2016   HCT 44.4 04/18/2016   PLT 213.0 04/18/2016   GLUCOSE 92 04/18/2016   CHOL 272 (H) 04/18/2016   TRIG 117.0 04/18/2016   HDL 63.60 04/18/2016   LDLDIRECT 159.0 04/07/2015   LDLCALC 185 (H) 04/18/2016   ALT 13 04/18/2016   AST 19 04/18/2016   NA 139 04/18/2016   K 3.7 04/18/2016   CL 101 04/18/2016   CREATININE 0.93 04/18/2016   BUN 21 04/18/2016   CO2 29 04/18/2016   TSH 3.80 04/18/2016     Mm Digital Screening  Result Date: 06/02/2016 CLINICAL DATA:  Screening. EXAM: DIGITAL SCREENING BILATERAL MAMMOGRAM WITH CAD COMPARISON:  Previous exam(s). ACR Breast Density Category b: There are scattered areas of fibroglandular density. FINDINGS: There are no findings suspicious for malignancy. Images were processed with CAD. IMPRESSION: No mammographic evidence of malignancy. A result letter of this screening mammogram will be mailed directly to the patient. RECOMMENDATION: Screening mammogram in one year. (Code:SM-B-01Y) BI-RADS CATEGORY  1: Negative. Electronically Signed   By: Lajean Manes M.D.   On: 06/02/2016 15:10    Assessment & Plan:   Problem List Items Addressed This Visit    Mixed hyperlipidemia    She did not tolerate pravastatin due to joint pain.  Lipids are unchanged, and given her age, no further treatment is planned.   Lab Results  Component Value Date   CHOL 272 (H) 04/18/2016   HDL 63.60 04/18/2016   LDLCALC 185 (H) 04/18/2016   LDLDIRECT 159.0 04/07/2015   TRIG 117.0 04/18/2016   CHOLHDL 4 04/18/2016         Hypertension - Primary    Well controlled on current regimen. Renal function is due for assessment , no changes today.  Lab Results  Component Value Date   CREATININE 0.93 04/18/2016   Lab Results  Component Value Date   NA 139 04/18/2016   K 3.7 04/18/2016   CL 101 04/18/2016   CO2 29 04/18/2016         Relevant Orders   Basic metabolic panel   Chronic systolic heart failure ([place])    Diagnosis reviewed with patient,  Last ECHO reviewed.  No signs of decompensation on exam.  Advised to continue current medications       Caregiver with fatigue    She is caring for her ailing husband without assistance.  She has insomnia but defers pharmacologic assistance.       A total of 25 minutes of face to face time was spent with patient more than half of which was spent in counselling about the above mentioned conditions  and coordination  of care   I am having Ms. Popwell maintain her fish oil-omega-3 fatty acids, Calcium Carbonate-Vitamin D, multivitamin, aspirin, metoprolol succinate, lisinopril, and amLODipine.  No orders of the defined types were placed in this encounter.   There are no discontinued medications.  Follow-up: No Follow-up on file.   Crecencio Mc, MD

## 2016-12-04 NOTE — Patient Instructions (Addendum)
You are doing EXTREMELY WELL!!!     There IS a purpose to each medication you are taking.  They help your heart in different ways.   I will see you in 6 months

## 2016-12-05 DIAGNOSIS — R5383 Other fatigue: Secondary | ICD-10-CM | POA: Insufficient documentation

## 2016-12-05 LAB — BASIC METABOLIC PANEL
BUN: 22 mg/dL (ref 6–23)
CO2: 28 mEq/L (ref 19–32)
CREATININE: 0.91 mg/dL (ref 0.40–1.20)
Calcium: 9.5 mg/dL (ref 8.4–10.5)
Chloride: 105 mEq/L (ref 96–112)
GFR: 61.44 mL/min (ref >60.00)
Glucose, Bld: 83 mg/dL (ref 70–99)
POTASSIUM: 4.5 meq/L (ref 3.5–5.1)
SODIUM: 139 meq/L (ref 135–145)

## 2016-12-05 NOTE — Assessment & Plan Note (Signed)
She did not tolerate pravastatin due to joint pain.  Lipids are unchanged, and given her age, no further treatment is planned.   Lab Results  Component Value Date   CHOL 272 (H) 04/18/2016   HDL 63.60 04/18/2016   LDLCALC 185 (H) 04/18/2016   LDLDIRECT 159.0 04/07/2015   TRIG 117.0 04/18/2016   CHOLHDL 4 04/18/2016

## 2016-12-05 NOTE — Assessment & Plan Note (Signed)
Diagnosis reviewed with patient,  Last ECHO reviewed.  No signs of decompensation on exam.  Advised to continue current medications

## 2016-12-05 NOTE — Assessment & Plan Note (Signed)
Well controlled on current regimen. Renal function is due for assessment , no changes today.  Lab Results  Component Value Date   CREATININE 0.93 04/18/2016   Lab Results  Component Value Date   NA 139 04/18/2016   K 3.7 04/18/2016   CL 101 04/18/2016   CO2 29 04/18/2016

## 2016-12-05 NOTE — Assessment & Plan Note (Signed)
She is caring for her ailing husband without assistance.  She has insomnia but defers pharmacologic assistance.

## 2016-12-12 ENCOUNTER — Ambulatory Visit: Payer: Medicare Other | Admitting: Cardiovascular Disease

## 2016-12-23 ENCOUNTER — Other Ambulatory Visit: Payer: Self-pay | Admitting: Internal Medicine

## 2017-01-16 DIAGNOSIS — S22070A Wedge compression fracture of T9-T10 vertebra, initial encounter for closed fracture: Secondary | ICD-10-CM | POA: Diagnosis not present

## 2017-01-16 DIAGNOSIS — M545 Low back pain: Secondary | ICD-10-CM | POA: Diagnosis not present

## 2017-01-19 DIAGNOSIS — M47817 Spondylosis without myelopathy or radiculopathy, lumbosacral region: Secondary | ICD-10-CM | POA: Diagnosis not present

## 2017-01-30 DIAGNOSIS — M461 Sacroiliitis, not elsewhere classified: Secondary | ICD-10-CM | POA: Diagnosis not present

## 2017-02-05 ENCOUNTER — Other Ambulatory Visit: Payer: Self-pay | Admitting: Orthopedic Surgery

## 2017-02-06 ENCOUNTER — Other Ambulatory Visit: Payer: Self-pay | Admitting: Orthopedic Surgery

## 2017-02-06 DIAGNOSIS — M461 Sacroiliitis, not elsewhere classified: Secondary | ICD-10-CM

## 2017-02-08 ENCOUNTER — Ambulatory Visit
Admission: RE | Admit: 2017-02-08 | Discharge: 2017-02-08 | Disposition: A | Discharge status: Home / Self Care | Payer: Medicare Other | Source: Ambulatory Visit | Attending: Orthopedic Surgery | Admitting: Orthopedic Surgery

## 2017-02-08 DIAGNOSIS — M461 Sacroiliitis, not elsewhere classified: Secondary | ICD-10-CM | POA: Insufficient documentation

## 2017-02-08 DIAGNOSIS — M533 Sacrococcygeal disorders, not elsewhere classified: Secondary | ICD-10-CM | POA: Diagnosis not present

## 2017-02-08 MED ORDER — TRIAMCINOLONE ACETONIDE 40 MG/ML IJ SUSP
INTRAMUSCULAR | Status: AC
  Administered 2017-02-08: 40 mg
  Filled 2017-02-08: qty 1

## 2017-02-08 MED ORDER — ROPIVACAINE HCL 5 MG/ML IJ SOLN
INTRAMUSCULAR | Status: AC
  Administered 2017-02-08: 5 mL
  Filled 2017-02-08: qty 30

## 2017-02-19 ENCOUNTER — Other Ambulatory Visit: Payer: Self-pay | Admitting: Internal Medicine

## 2017-03-14 DIAGNOSIS — Z23 Encounter for immunization: Secondary | ICD-10-CM | POA: Diagnosis not present

## 2017-04-02 ENCOUNTER — Ambulatory Visit (INDEPENDENT_AMBULATORY_CARE_PROVIDER_SITE_OTHER): Payer: Medicare Other

## 2017-04-02 VITALS — BP 130/64 | HR 60 | Temp 98.0°F | Resp 14 | Ht <= 58 in | Wt 121.8 lb

## 2017-04-02 DIAGNOSIS — Z1331 Encounter for screening for depression: Secondary | ICD-10-CM

## 2017-04-02 DIAGNOSIS — Z0000 Encounter for general adult medical examination without abnormal findings: Secondary | ICD-10-CM

## 2017-04-02 NOTE — Patient Instructions (Addendum)
  [name] , Thank you for taking time to come for your Medicare Wellness Visit. I appreciate your ongoing commitment to your health goals. Please review the following plan we discussed and let me know if I can assist you in the future.   Follow up with Dr. Derrel Nip as needed.    Bring a copy of your [place] and/or Living Will to be scanned into chart.  Take melatonin every other day, 30-45 minutes before bedtime to help with sleeping.  Have a great day!  These are the goals we discussed: Goals    . Increase physical activity          Walk for exercise        This is a list of the screening recommended for you and due dates:  Health Maintenance  Topic Date Due  . DEXA scan (bone density measurement)  05/17/2018*  . Tetanus Vaccine  04/02/2023  . Flu Shot  Completed  . Pneumonia vaccines  Completed  *Topic was postponed. The date shown is not the original due date.

## 2017-04-02 NOTE — Progress Notes (Signed)
Subjective:   [name] is a 83 y.o. female who presents for Medicare Annual (Subsequent) preventive examination.  Review of Systems:  No ROS.  Medicare Wellness Visit. Additional risk factors are reflected in the social history.  Cardiac Risk Factors include: advanced age (>34men, >83 women)     Objective:     Vitals: BP 130/64 (BP Location: Left Arm, Patient Position: Sitting, Cuff Size: Normal)   Pulse 60   Temp 98 F (36.7 C) (Oral)   Resp 14   Ht 4\' 10"  (1.473 m)   Wt 121 lb 12.8 oz (55.2 kg)   SpO2 98%   BMI 25.46 kg/m   Body mass index is 25.46 kg/m.   Tobacco History  Smoking Status  . Never Smoker  Smokeless Tobacco  . Never Used     Counseling given: Not Answered   Past Medical History:  Diagnosis Date  . Hyperlipidemia   . Hypertension    Past Surgical History:  Procedure Laterality Date  . ABDOMINAL HYSTERECTOMY    . ankle     rods in both ankles  . BREAST BIOPSY Bilateral YRS AGO   NEG  . CARDIAC CATHETERIZATION     [facility]  . left ankle  Octo 2012   s/p pinning , [place]raz[place]  . LEFT HEART CATHETERIZATION WITH CORONARY ANGIOGRAM N/A 10/31/2012   Procedure: LEFT HEART CATHETERIZATION WITH CORONARY ANGIOGRAM;  Surgeon: Clent Demark, MD;  Location: [facility] CATH LAB;  Service: Cardiovascular;  Laterality: N/A;   Family History  Problem Relation Age of Onset  . Hypertension Mother   . Heart attack Mother   . Hypertension Father   . Cancer Father        stomach  . Heart attack Brother   . Stroke Sister   . Breast cancer Neg Hx    History  Sexual Activity  . Sexual activity: Not Currently    Outpatient Encounter Prescriptions as of 04/02/2017  Medication Sig  . amLODipine (NORVASC) 5 MG tablet TAKE ONE TABLET BY MOUTH TWICE DAILY  . aspirin 81 MG tablet Take 81 mg by mouth daily.    . Calcium Carbonate-Vitamin D (CALCIUM + D) 600-200 MG-UNIT TABS Take by mouth.    . fish oil-omega-3 fatty acids 1000 MG capsule Take by mouth daily.    Marland Kitchen  lisinopril (PRINIVIL,ZESTRIL) 40 MG tablet TAKE ONE TABLET BY MOUTH EVERY DAY  . MELATONIN PO Take 6 mg by mouth at bedtime as needed.  . metoprolol succinate (TOPROL-XL) 25 MG 24 hr tablet TAKE ONE TABLET BY MOUTH EVERY DAY  . Multiple Vitamin (MULTIVITAMIN) tablet Take 1 tablet by mouth daily.     No facility-administered encounter medications on file as of 04/02/2017.     Activities of Daily Living In your present state of health, do you have any difficulty performing the following activities: 04/02/2017  Hearing? N  Vision? N  Difficulty concentrating or making decisions? N  Walking or climbing stairs? N  Dressing or bathing? N  Doing errands, shopping? N  Preparing Food and eating ? N  Using the Toilet? N  In the past six months, have you accidently leaked urine? N  Do you have problems with loss of bowel control? N  Managing your Medications? N  Managing your Finances? N  Housekeeping or managing your Housekeeping? N  Some recent data might be hidden    Patient Care Team: Crecencio Mc, MD as PCP - General (Internal Medicine)    Assessment:  This is a routine wellness examination for [name]. The goal of the wellness visit is to assist the patient how to close the gaps in care and create a preventative care plan for the patient.   The roster of all physicians providing medical care to patient is listed in the Snapshot section of the chart.  Taking calcium VIT D as appropriate/Osteoporosis risk reviewed.    Safety issues reviewed; Life alert and smoke and carbon monoxide detectors in the home. No firearms in the home.  Wears seatbelts when driving or riding with others. Patient does wear sunscreen or protective clothing when in direct sunlight. No violence in the home.  Depression- PHQ 2 &9 complete.  No signs/symptoms or verbal communication regarding little pleasure in doing things, feeling down, depressed or hopeless. No changes in sleeping, energy, eating,  concentrating.  No thoughts of self harm or harm towards others.  Time spent on this topic is 8 minutes.   Patient is alert, normal appearance, oriented to person/place/and time.  Correctly identified the president of the [place], recall of 2/3 words, and performing simple calculations. Displays appropriate judgement and can read correct time from watch face.   No new identified risk were noted.  No failures at ADL's or IADL's.    BMI- discussed the importance of a healthy diet, water intake and the benefits of aerobic exercise. Educational material provided.   24 hour diet recall: Breakfast: cereal Lunch: deli sandwich Dinner: pulled pork sandwich   Daily fluid intake: 0 cups of caffeine, 4 cups of water  Dental- every 6 months.    Eye- Visual acuity not assessed per patient preference since they have regular follow up with the ophthalmologist.  Wears corrective lenses.  Sleep patterns- Sleeps 4 hours at night.  Wakes feeling rested.  Naps during the day as needed.  Taking melatonin 6mg  once a week.  Patient Concerns: None at this time. Follow up with PCP as needed.  Exercise Activities and Dietary recommendations Current Exercise Habits: Home exercise routine, Type of exercise: calisthenics (TOPS program), Time (Minutes): 60, Frequency (Times/Week): 1, Weekly Exercise (Minutes/Week): 60, Intensity: Mild  Goals    . Increase physical activity          Walk for exercise       Fall Risk Fall Risk  04/02/2017 03/31/2016 03/31/2015 03/23/2014 03/19/2013  Falls in the past year? No No No No No   Depression Screen PHQ 2/9 Scores 04/02/2017 03/31/2016 03/31/2015 03/23/2014  PHQ - 2 Score 0 0 0 0  PHQ- 9 Score 2 - - -     Cognitive Function MMSE - Mini Mental State Exam 03/31/2016 03/31/2015  Orientation to time 5 5  Orientation to Place 5 5  Registration 3 3  Attention/ Calculation 5 5  Recall 3 3  Language- name 2 objects 2 2  Language- repeat 1 1  Language- follow 3 step  command 3 3  Language- read & follow direction 1 1  Write a sentence 1 1  Copy design 1 1  Total score 30 30     6CIT Screen 04/02/2017  What Year? 0 points  What month? 0 points  What time? 0 points  Count back from 20 0 points  Months in reverse 2 points  Repeat phrase 0 points  Total Score 2    Immunization History  Administered Date(s) Administered  . Influenza Split 03/20/2011, 03/13/2012, 03/09/2014  . Influenza,inj,Quad PF,6+ Mos 03/31/2015  . Influenza-Unspecified 03/14/2013, 03/07/2014, 03/20/2016, 03/19/2017  . Pneumococcal Conjugate-13  03/23/2014  . Pneumococcal Polysaccharide-23 03/19/2010  . Tdap 04/01/2013  . Zoster 03/20/2011   Screening Tests Health Maintenance  Topic Date Due  . DEXA SCAN  05/17/2018 (Originally 10/29/1989)  . TETANUS/TDAP  04/02/2023  . INFLUENZA VACCINE  Completed  . PNA vac Low Risk Adult  Completed      Plan:    End of life planning; Advance aging; Advanced directives discussed. Copy of current HCPOA/Living Will requested.    I have personally reviewed and noted the following in the patient's chart:   . Medical and social history . Use of alcohol, tobacco or illicit drugs  . Current medications and supplements . Functional ability and status . Nutritional status . Physical activity . Advanced directives . List of other physicians . Hospitalizations, surgeries, and ER visits in previous 12 months . Vitals . Screenings to include cognitive, depression, and falls . Referrals and appointments  In addition, I have reviewed and discussed with patient certain preventive protocols, quality metrics, and best practice recommendations. A written personalized care plan for preventive services as well as general preventive health recommendations were provided to patient.     OBrien-Blaney, Torres Hardenbrook L, LPN  18/56/[id number]    I have reviewed the above information and agree with above.   Deborra Medina, MD

## 2017-04-03 DIAGNOSIS — H2513 Age-related nuclear cataract, bilateral: Secondary | ICD-10-CM | POA: Diagnosis not present

## 2017-04-21 ENCOUNTER — Other Ambulatory Visit: Payer: Self-pay | Admitting: Internal Medicine

## 2017-05-01 ENCOUNTER — Other Ambulatory Visit: Payer: Self-pay | Admitting: Internal Medicine

## 2017-05-01 DIAGNOSIS — Z1231 Encounter for screening mammogram for malignant neoplasm of breast: Secondary | ICD-10-CM

## 2017-05-22 ENCOUNTER — Other Ambulatory Visit: Payer: Self-pay | Admitting: Internal Medicine

## 2017-05-28 ENCOUNTER — Other Ambulatory Visit: Payer: Medicare Other

## 2017-06-01 ENCOUNTER — Other Ambulatory Visit (INDEPENDENT_AMBULATORY_CARE_PROVIDER_SITE_OTHER): Payer: Medicare Other

## 2017-06-01 DIAGNOSIS — I10 Essential (primary) hypertension: Secondary | ICD-10-CM | POA: Diagnosis not present

## 2017-06-01 DIAGNOSIS — E7800 Pure hypercholesterolemia, unspecified: Secondary | ICD-10-CM | POA: Diagnosis not present

## 2017-06-01 LAB — COMPREHENSIVE METABOLIC PANEL
ALK PHOS: 64 U/L (ref 39–117)
ALT: 12 U/L (ref 0–35)
AST: 19 U/L (ref 0–37)
Albumin: 4 g/dL (ref 3.5–5.2)
BILIRUBIN TOTAL: 0.7 mg/dL (ref 0.2–1.2)
BUN: 17 mg/dL (ref 6–23)
CALCIUM: 9.6 mg/dL (ref 8.4–10.5)
CO2: 32 mEq/L (ref 19–32)
CREATININE: 0.93 mg/dL (ref 0.40–1.20)
Chloride: 103 mEq/L (ref 96–112)
GFR: 59.85 mL/min — AB (ref >60.00)
Glucose, Bld: 79 mg/dL (ref 70–99)
Potassium: 4 mEq/L (ref 3.5–5.1)
Sodium: 140 mEq/L (ref 135–145)
TOTAL PROTEIN: 7.2 g/dL (ref 6.0–8.3)

## 2017-06-01 LAB — LIPID PANEL
CHOLESTEROL: 260 mg/dL — AB (ref 0–200)
HDL: 61.7 mg/dL (ref >39.00)
LDL Cholesterol: 173 mg/dL — ABNORMAL HIGH (ref 0–99)
NonHDL: 197.97
TRIGLYCERIDES: 125 mg/dL (ref 0.0–149.0)
Total CHOL/HDL Ratio: 4
VLDL: 25 mg/dL (ref 0.0–40.0)

## 2017-06-04 ENCOUNTER — Ambulatory Visit (INDEPENDENT_AMBULATORY_CARE_PROVIDER_SITE_OTHER): Payer: Medicare Other | Admitting: Internal Medicine

## 2017-06-04 ENCOUNTER — Encounter: Payer: Self-pay | Admitting: Internal Medicine

## 2017-06-04 DIAGNOSIS — E782 Mixed hyperlipidemia: Secondary | ICD-10-CM

## 2017-06-04 DIAGNOSIS — F4320 Adjustment disorder, unspecified: Secondary | ICD-10-CM | POA: Insufficient documentation

## 2017-06-04 DIAGNOSIS — F5102 Adjustment insomnia: Secondary | ICD-10-CM

## 2017-06-04 DIAGNOSIS — I428 Other cardiomyopathies: Secondary | ICD-10-CM

## 2017-06-04 DIAGNOSIS — F4321 Adjustment disorder with depressed mood: Secondary | ICD-10-CM | POA: Diagnosis not present

## 2017-06-04 DIAGNOSIS — I10 Essential (primary) hypertension: Secondary | ICD-10-CM | POA: Diagnosis not present

## 2017-06-04 MED ORDER — ALPRAZOLAM 0.25 MG PO TABS: 0.2500 mg | ORAL_TABLET | Freq: Every evening | ORAL | 1 refills | Status: DC | PRN

## 2017-06-04 MED ORDER — ZOSTER VAC RECOMB ADJUVANTED 50 MCG/0.5ML IM SUSR: 0.5000 mL | Freq: Once | INTRAMUSCULAR | 1 refills | Status: AC

## 2017-06-04 NOTE — Assessment & Plan Note (Addendum)
She was widowed in August after 39 years of marraiage/companionship with husband.  Patient  has adequate coping skills and emotional support . New onset insomnia (early waking),  trila of alprazolam

## 2017-06-04 NOTE — Assessment & Plan Note (Signed)
Asymptomatic today.  Exercising 3/week.  No edema.

## 2017-06-04 NOTE — Progress Notes (Signed)
Subjective:  Patient ID: [name], female    DOB: 1923/03/08  Age: 83 y.o. MRN: [id number]  CC: Diagnoses of Nonischemic cardiomyopathy ([place]), Essential hypertension, Mixed hyperlipidemia, Grief reaction, and Insomnia due to psychological stress were pertinent to this visit.  HPI [name] presents for 6 month follow up on hypertension,  Chronic systolic heart failure with LBBB confirmed by ECHO with  EF 45-50% by 2015 ECHO  She is sad today.  Her husband died 4 months ago, he had been suferring from congestive heart failure.     They had already downsized and she has developed relationships with other widows.  Patient has a Has a living will.  Daughter has POA   Insomnia . Since her  Husband died.  Falls asleep but wakes up and can't go back to sleep,  Has tried melatonin  And OTC sleep eze.  No history of medication trials.   Taking organic cider vinegar mexed with tomato juice for cholesterol management.  Exercising 3 tims per week.   Outpatient Medications Prior to Visit  Medication Sig Dispense Refill  . amLODipine (NORVASC) 5 MG tablet TAKE ONE TABLET BY MOUTH TWICE DAILY 60 tablet 2  . aspirin 81 MG tablet Take 81 mg by mouth daily.      . Calcium Carbonate-Vitamin D (CALCIUM + D) 600-200 MG-UNIT TABS Take by mouth.      . fish oil-omega-3 fatty acids 1000 MG capsule Take by mouth daily.      Marland Kitchen lisinopril (PRINIVIL,ZESTRIL) 40 MG tablet TAKE ONE TABLET BY MOUTH EVERY DAY 30 tablet 2  . MELATONIN PO Take 6 mg by mouth at bedtime as needed.    . metoprolol succinate (TOPROL-XL) 25 MG 24 hr tablet TAKE ONE TABLET EVERY DAY 30 tablet 2  . Multiple Vitamin (MULTIVITAMIN) tablet Take 1 tablet by mouth daily.       No facility-administered medications prior to visit.     Review of Systems;  Patient denies headache, fevers, malaise, unintentional weight loss, skin rash, eye pain, sinus congestion and sinus pain, sore throat, dysphagia,  hemoptysis , cough, dyspnea,  wheezing, chest pain, palpitations, orthopnea, edema, abdominal pain, nausea, melena, diarrhea, constipation, flank pain, dysuria, hematuria, urinary  Frequency, nocturia, numbness, tingling, seizures,  Focal weakness, Loss of consciousness,  Tremor, insomnia, depression, anxiety, and suicidal ideation.      Objective:  BP 134/68 (BP Location: Left Arm, Patient Position: Sitting, Cuff Size: Normal)   Pulse (!) 59   Temp 97.6 F (36.4 C) (Oral)   Resp 15   Ht 4\' 10"  (1.473 m)   Wt 126 lb 9.6 oz (57.4 kg)   SpO2 94%   BMI 26.46 kg/m   BP Readings from Last 3 Encounters:  06/04/17 134/68  04/02/17 130/64  02/08/17 (!) 143/48    Wt Readings from Last 3 Encounters:  06/04/17 126 lb 9.6 oz (57.4 kg)  04/02/17 121 lb 12.8 oz (55.2 kg)  02/08/17 125 lb (56.7 kg)    General appearance: alert, cooperative and appears stated age Ears: normal TM's and external ear canals both ears Throat: lips, mucosa, and tongue normal; teeth and gums normal Neck: no adenopathy, no carotid bruit, supple, symmetrical, trachea midline and thyroid not enlarged, symmetric, no tenderness/mass/nodules Back: symmetric, no curvature. ROM normal. No CVA tenderness. Lungs: clear to auscultation bilaterally Heart: regular rate and rhythm, S1, S2 normal, no murmur, click, rub or gallop Abdomen: soft, non-tender; bowel sounds normal; no masses,  no organomegaly Pulses: 2+  and symmetric Skin: Skin color, texture, turgor normal. No rashes or lesions Lymph nodes: Cervical, supraclavicular, and axillary nodes normal.  No results found for: HGBA1C  Lab Results  Component Value Date   CREATININE 0.93 06/01/2017   CREATININE 0.91 12/04/2016   CREATININE 0.93 04/18/2016    Lab Results  Component Value Date   WBC 7.4 04/18/2016   HGB 15.1 (H) 04/18/2016   HCT 44.4 04/18/2016   PLT 213.0 04/18/2016   GLUCOSE 79 06/01/2017   CHOL 260 (H) 06/01/2017   TRIG 125.0 06/01/2017   HDL 61.70 06/01/2017   LDLDIRECT  159.0 04/07/2015   LDLCALC 173 (H) 06/01/2017   ALT 12 06/01/2017   AST 19 06/01/2017   NA 140 06/01/2017   K 4.0 06/01/2017   CL 103 06/01/2017   CREATININE 0.93 06/01/2017   BUN 17 06/01/2017   CO2 32 06/01/2017   TSH 3.80 04/18/2016     Assessment & Plan:   Problem List Items Addressed This Visit    Grief reaction    She was widowed in August after 71 years of marraiage/companionship with husband.  Patient  has adequate coping skills and emotional support . New onset insomnia (early waking),  trila of alprazolam      Hypertension    Well controlled on current regimen. Renal function stable, no changes today.  Lab Results  Component Value Date   CREATININE 0.93 06/01/2017   BUN 17 06/01/2017   NA 140 06/01/2017   K 4.0 06/01/2017   CL 103 06/01/2017   CO2 32 06/01/2017   Lab Results  Component Value Date   NA 140 06/01/2017   K 4.0 06/01/2017   CL 103 06/01/2017   CO2 32 06/01/2017         Insomnia due to psychological stress    Trial of alprazolam .The risks and benefits of benzodiazepine use were discussed with patient today including excessive sedation leading to respiratory depression,  impaired thinking/driving, and addiction.  Patient was advised to avoid concurrent use with alcohol, to use medication only as needed and not to share with others  .        Mixed hyperlipidemia    Improved slightly with use of natural supplements.  She is opposed to statins and at her age I am in agreement.   Lab Results  Component Value Date   CHOL 260 (H) 06/01/2017   HDL 61.70 06/01/2017   LDLCALC 173 (H) 06/01/2017   LDLDIRECT 159.0 04/07/2015   TRIG 125.0 06/01/2017   CHOLHDL 4 06/01/2017         Nonischemic cardiomyopathy ([place])    Asymptomatic today.  Exercising 3/week.  No edema.        A total of 25 minutes of face to face time was spent with patient more than half of which was spent in counselling about the above mentioned conditions  and coordination  of care    I am having Noor I. Golinski start on ALPRAZolam and Zoster Vaccine Adjuvanted. I am also having her maintain her fish oil-omega-3 fatty acids, Calcium Carbonate-Vitamin D, multivitamin, aspirin, MELATONIN PO, amLODipine, lisinopril, and metoprolol succinate.  Meds ordered this encounter  Medications  . ALPRAZolam (XANAX) 0.25 MG tablet    Sig: Take 1 tablet (0.25 mg total) by mouth at bedtime as needed for anxiety.    Dispense:  30 tablet    Refill:  1  . Zoster Vaccine Adjuvanted [facility]) injection    Sig: Inject 0.5 mLs into the muscle  once for 1 dose.    Dispense:  1 each    Refill:  1    There are no discontinued medications.  Follow-up: Return in about 6 months (around 12/03/2017).   Crecencio Mc, MD

## 2017-06-04 NOTE — Assessment & Plan Note (Signed)
Trial of alprazolam .The risks and benefits of benzodiazepine use were discussed with patient today including excessive sedation leading to respiratory depression,  impaired thinking/driving, and addiction.  Patient was advised to avoid concurrent use with alcohol, to use medication only as needed and not to share with others  .

## 2017-06-04 NOTE — Assessment & Plan Note (Signed)
Well controlled on current regimen. Renal function stable, no changes today.  Lab Results  Component Value Date   CREATININE 0.93 06/01/2017   BUN 17 06/01/2017   NA 140 06/01/2017   K 4.0 06/01/2017   CL 103 06/01/2017   CO2 32 06/01/2017   Lab Results  Component Value Date   NA 140 06/01/2017   K 4.0 06/01/2017   CL 103 06/01/2017   CO2 32 06/01/2017

## 2017-06-04 NOTE — Patient Instructions (Addendum)
   I am so sorry about your loss.  Your husband must have been a wonderful man to have given you 9 wonderful years together.  I hope you have a wonderful holiday with Butch Penny   I am prescribing alprazolam to use AS NEEDED to help you fall back asleep,  Start with 1/2 tablet and take only if you have been awake for more than 15 minutes.    Ask your daughter to play the "headspace " app on her cell phone.  It will teach you some very helpful relaxation  breathing techniques  The ShingRx vaccine is now available in local pharmacies and is much more protective thant Zostavaxs,  It is therefore ADVISED for all interested adults over 50 to prevent shingles

## 2017-06-04 NOTE — Assessment & Plan Note (Signed)
Improved slightly with use of natural supplements.  She is opposed to statins and at her age I am in agreement.   Lab Results  Component Value Date   CHOL 260 (H) 06/01/2017   HDL 61.70 06/01/2017   LDLCALC 173 (H) 06/01/2017   LDLDIRECT 159.0 04/07/2015   TRIG 125.0 06/01/2017   CHOLHDL 4 06/01/2017

## 2017-06-14 ENCOUNTER — Ambulatory Visit
Admission: RE | Admit: 2017-06-14 | Discharge: 2017-06-14 | Disposition: A | Discharge status: Home / Self Care | Payer: Medicare Other | Source: Ambulatory Visit | Attending: Internal Medicine | Admitting: Internal Medicine

## 2017-06-14 DIAGNOSIS — Z1231 Encounter for screening mammogram for malignant neoplasm of breast: Secondary | ICD-10-CM | POA: Insufficient documentation

## 2017-07-21 ENCOUNTER — Other Ambulatory Visit: Payer: Self-pay | Admitting: Internal Medicine

## 2017-08-20 ENCOUNTER — Other Ambulatory Visit: Payer: Self-pay | Admitting: Internal Medicine

## 2017-09-10 DIAGNOSIS — D225 Melanocytic nevi of trunk: Secondary | ICD-10-CM | POA: Diagnosis not present

## 2017-09-10 DIAGNOSIS — D2261 Melanocytic nevi of right upper limb, including shoulder: Secondary | ICD-10-CM | POA: Diagnosis not present

## 2017-09-10 DIAGNOSIS — D2272 Melanocytic nevi of left lower limb, including hip: Secondary | ICD-10-CM | POA: Diagnosis not present

## 2017-09-10 DIAGNOSIS — D485 Neoplasm of uncertain behavior of skin: Secondary | ICD-10-CM | POA: Diagnosis not present

## 2017-09-10 DIAGNOSIS — Z85820 Personal history of malignant melanoma of skin: Secondary | ICD-10-CM | POA: Diagnosis not present

## 2017-09-10 DIAGNOSIS — D0439 Carcinoma in situ of skin of other parts of face: Secondary | ICD-10-CM | POA: Diagnosis not present

## 2017-09-15 DIAGNOSIS — B029 Zoster without complications: Secondary | ICD-10-CM | POA: Diagnosis not present

## 2017-10-19 ENCOUNTER — Other Ambulatory Visit: Payer: Self-pay | Admitting: Internal Medicine

## 2017-11-01 DIAGNOSIS — W010XXA Fall on same level from slipping, tripping and stumbling without subsequent striking against object, initial encounter: Secondary | ICD-10-CM | POA: Diagnosis not present

## 2017-11-01 DIAGNOSIS — S9032XA Contusion of left foot, initial encounter: Secondary | ICD-10-CM | POA: Diagnosis not present

## 2017-11-20 ENCOUNTER — Other Ambulatory Visit: Payer: Self-pay | Admitting: Internal Medicine

## 2017-12-03 ENCOUNTER — Encounter: Payer: Self-pay | Admitting: Internal Medicine

## 2017-12-03 ENCOUNTER — Ambulatory Visit (INDEPENDENT_AMBULATORY_CARE_PROVIDER_SITE_OTHER): Payer: Medicare Other | Admitting: Internal Medicine

## 2017-12-03 VITALS — BP 130/70 | HR 66 | Temp 98.2°F | Resp 15 | Ht <= 58 in | Wt 129.6 lb

## 2017-12-03 DIAGNOSIS — R002 Palpitations: Secondary | ICD-10-CM

## 2017-12-03 DIAGNOSIS — R0609 Other forms of dyspnea: Secondary | ICD-10-CM | POA: Diagnosis not present

## 2017-12-03 DIAGNOSIS — E782 Mixed hyperlipidemia: Secondary | ICD-10-CM

## 2017-12-03 DIAGNOSIS — F5102 Adjustment insomnia: Secondary | ICD-10-CM | POA: Diagnosis not present

## 2017-12-03 DIAGNOSIS — I10 Essential (primary) hypertension: Secondary | ICD-10-CM

## 2017-12-03 LAB — COMPREHENSIVE METABOLIC PANEL
ALBUMIN: 4 g/dL (ref 3.5–5.2)
ALK PHOS: 56 U/L (ref 39–117)
ALT: 13 U/L (ref 0–35)
AST: 20 U/L (ref 0–37)
BILIRUBIN TOTAL: 0.7 mg/dL (ref 0.2–1.2)
BUN: 21 mg/dL (ref 6–23)
CO2: 27 mEq/L (ref 19–32)
CREATININE: 0.93 mg/dL (ref 0.40–1.20)
Calcium: 9.8 mg/dL (ref 8.4–10.5)
Chloride: 104 mEq/L (ref 96–112)
GFR: 59.79 mL/min — ABNORMAL LOW (ref >60.00)
GLUCOSE: 88 mg/dL (ref 70–99)
Potassium: 4.6 mEq/L (ref 3.5–5.1)
SODIUM: 140 meq/L (ref 135–145)
TOTAL PROTEIN: 6.9 g/dL (ref 6.0–8.3)

## 2017-12-03 LAB — LIPID PANEL
CHOLESTEROL: 285 mg/dL — AB (ref 0–200)
HDL: 60.3 mg/dL (ref >39.00)
LDL Cholesterol: 200 mg/dL — ABNORMAL HIGH (ref 0–99)
NONHDL: 224.55
Total CHOL/HDL Ratio: 5
Triglycerides: 125 mg/dL (ref 0.0–149.0)
VLDL: 25 mg/dL (ref 0.0–40.0)

## 2017-12-03 LAB — TSH: TSH: 2.7 u[IU]/mL (ref 0.35–4.50)

## 2017-12-03 NOTE — Progress Notes (Signed)
Subjective:  Patient ID: [name], female    DOB: 02-Jan-1923  Age: 84 y.o. MRN: [id number]  CC: The primary encounter diagnosis was Palpitations. Diagnoses of Mixed hyperlipidemia, Essential hypertension, Exertional dyspnea, and Insomnia due to psychological stress were also pertinent to this visit.  HPI [name] presents for 6 month follow up on hypertension , iischemic cardiomyopathy  Weighing self daily .  Last ECHO 2015 45-50%  Needs annual appointment with [facility]  Sedentary since transitioning to apartment.  Used to tend a garden Notes some  dyspnea with grocery shopping but not doing ADLS  Sleeps on one pillow  Chronically .  Denies orthopnea,  But notes that voice has become less strong and she can no longer sing.   For insomnia uses melatonin and other otc mwds     Outpatient Medications Prior to Visit  Medication Sig Dispense Refill  . amLODipine (NORVASC) 5 MG tablet TAKE 1 TABLET BY MOUTH TWICE DAILY 180 tablet 1  . aspirin 81 MG tablet Take 81 mg by mouth daily.      . Calcium Carbonate-Vitamin D (CALCIUM + D) 600-200 MG-UNIT TABS Take by mouth.      . fish oil-omega-3 fatty acids 1000 MG capsule Take by mouth daily.      Marland Kitchen lisinopril (PRINIVIL,ZESTRIL) 40 MG tablet TAKE ONE TABLET BY MOUTH EVERY DAY 30 tablet 0  . MELATONIN PO Take 6 mg by mouth at bedtime as needed.    . metoprolol succinate (TOPROL-XL) 25 MG 24 hr tablet TAKE ONE TABLET EVERY DAY 90 tablet 1  . Multiple Vitamin (MULTIVITAMIN) tablet Take 1 tablet by mouth daily.      . valACYclovir (VALTREX) 1000 MG tablet Take by mouth.    . ALPRAZolam (XANAX) 0.25 MG tablet Take 1 tablet (0.25 mg total) by mouth at bedtime as needed for anxiety. (Patient not taking: Reported on 12/03/2017) 30 tablet 1   No facility-administered medications prior to visit.     Review of Systems;  Patient denies headache, fevers, malaise, unintentional weight loss, skin rash, eye pain, sinus congestion and sinus pain, sore  throat, dysphagia,  hemoptysis , cough,  wheezing, chest pain, palpitations, orthopnea, edema, abdominal pain, nausea, melena, diarrhea, constipation, flank pain, dysuria, hematuria, urinary  Frequency, nocturia, numbness, tingling, seizures,  Focal weakness, Loss of consciousness,  Tremor,  depression, anxiety, and suicidal ideation.      Objective:  BP 130/70 (BP Location: Left Arm, Patient Position: Sitting, Cuff Size: Normal)   Pulse 66   Temp 98.2 F (36.8 C) (Oral)   Resp 15   Ht 4\' 10"  (1.473 m)   Wt 129 lb 9.6 oz (58.8 kg)   SpO2 95%   BMI 27.09 kg/m   BP Readings from Last 3 Encounters:  12/03/17 130/70  06/04/17 134/68  04/02/17 130/64    Wt Readings from Last 3 Encounters:  12/03/17 129 lb 9.6 oz (58.8 kg)  06/04/17 126 lb 9.6 oz (57.4 kg)  04/02/17 121 lb 12.8 oz (55.2 kg)    General appearance: alert, cooperative and appears younger than her stated age Ears: normal TM's and external ear canals both ears Throat: lips, mucosa, and tongue normal; teeth and gums normal Neck: no adenopathy, no carotid bruit, supple, symmetrical, trachea midline and thyroid not enlarged, symmetric, no tenderness/mass/nodules Back: kyphotic , ROM restricted . No CVA tenderness. Lungs: clear to auscultation bilaterally Heart: regular rate and rhythm, S1, S2 normal, no murmur, click, rub or gallop Abdomen: soft, non-tender; bowel  sounds normal; no masses,  no organomegaly Pulses: 2+ and symmetric Skin: Skin color, texture, turgor normal. No rashes or lesions Lymph nodes: Cervical, supraclavicular, and axillary nodes normal.  No results found for: HGBA1C  Lab Results  Component Value Date   CREATININE 0.93 06/01/2017   CREATININE 0.91 12/04/2016   CREATININE 0.93 04/18/2016    Lab Results  Component Value Date   WBC 7.4 04/18/2016   HGB 15.1 (H) 04/18/2016   HCT 44.4 04/18/2016   PLT 213.0 04/18/2016   GLUCOSE 79 06/01/2017   CHOL 260 (H) 06/01/2017   TRIG 125.0 06/01/2017     HDL 61.70 06/01/2017   LDLDIRECT 159.0 04/07/2015   LDLCALC 173 (H) 06/01/2017   ALT 12 06/01/2017   AST 19 06/01/2017   NA 140 06/01/2017   K 4.0 06/01/2017   CL 103 06/01/2017   CREATININE 0.93 06/01/2017   BUN 17 06/01/2017   CO2 32 06/01/2017   TSH 3.80 04/18/2016    Mm Digital Screening Bilateral  Result Date: 06/14/2017 CLINICAL DATA:  Screening. EXAM: DIGITAL SCREENING BILATERAL MAMMOGRAM WITH CAD COMPARISON:  Previous exam(s). ACR Breast Density Category b: There are scattered areas of fibroglandular density. FINDINGS: There are no findings suspicious for malignancy. Images were processed with CAD. IMPRESSION: No mammographic evidence of malignancy. A result letter of this screening mammogram will be mailed directly to the patient. RECOMMENDATION: Screening mammogram in one year. (Code:SM-B-01Y) BI-RADS CATEGORY  1: Negative. Electronically Signed   By: [name] M.D.   On: 06/14/2017 16:18    Assessment & Plan:   Problem List Items Addressed This Visit    Palpitations - Primary   Relevant Orders   TSH   Mixed hyperlipidemia   Relevant Orders   Lipid panel   Insomnia due to psychological stress    Not taking  alprazolam .using OTC meds including melatonin        Hypertension    Well controlled on current regimen of amlodipine lisinopril and metoprolol.  Denies cough and fluid retention . Renal function due, no changes today.      Relevant Orders   Comprehensive metabolic panel   Exertional dyspnea    Secondary to ischemic cardiomyopathy last ECHO 2015 EF 45-50%,  Deconditioning from sedentary lifestyle,  And kyphosis like causing restrictive pulmonary mechanics. encouraged to start walking daily , encouraged to start singing ,  And pay attention to posture        A total of 25 minutes of face to face time was spent with patient more than half of which was spent in counselling about the above mentioned conditions  and coordination of care  I have discontinued  [name]'s ALPRAZolam. I am also having her maintain her fish oil-omega-3 fatty acids, Calcium Carbonate-Vitamin D, multivitamin, aspirin, MELATONIN PO, metoprolol succinate, lisinopril, amLODipine, and valACYclovir.  No orders of the defined types were placed in this encounter.   Medications Discontinued During This Encounter  Medication Reason  . ALPRAZolam (XANAX) 0.25 MG tablet     Follow-up: Return in about 6 months (around 06/04/2018).   Crecencio Mc, MD

## 2017-12-03 NOTE — Assessment & Plan Note (Signed)
Well controlled on current regimen of amlodipine lisinopril and metoprolol.  Denies cough and fluid retention . Renal function due, no changes today.

## 2017-12-03 NOTE — Patient Instructions (Addendum)
You are doing well!  You are starting to develop "the hump" in your upper back from poor posture  Please do the exercises I demonstrated to you today DAILY:  Roll your shoulder frontwards and backwards . 10 times ,  3 sets Add 5 lb wight to each hand when it gets easy   Practice getting up from a chair WITHOUT USING YOUR HANDS  10 times  3 sets  Stand on one leg for 10 seconds, near the kitchen counter to work on your balance.  Try to use only one finger on the counter 5 times each leg   3 sets

## 2017-12-03 NOTE — Assessment & Plan Note (Signed)
Not taking  alprazolam .using OTC meds including melatonin

## 2017-12-03 NOTE — Assessment & Plan Note (Signed)
Secondary to ischemic cardiomyopathy last ECHO 2015 EF 45-50%,  Deconditioning from sedentary lifestyle,  And kyphosis like causing restrictive pulmonary mechanics. encouraged to start walking daily , encouraged to start singing ,  And pay attention to posture

## 2018-01-21 ENCOUNTER — Other Ambulatory Visit: Payer: Self-pay | Admitting: Internal Medicine

## 2018-01-21 DIAGNOSIS — D2261 Melanocytic nevi of right upper limb, including shoulder: Secondary | ICD-10-CM | POA: Diagnosis not present

## 2018-01-21 DIAGNOSIS — I781 Nevus, non-neoplastic: Secondary | ICD-10-CM | POA: Diagnosis not present

## 2018-01-21 DIAGNOSIS — Z85828 Personal history of other malignant neoplasm of skin: Secondary | ICD-10-CM | POA: Diagnosis not present

## 2018-01-21 DIAGNOSIS — D2272 Melanocytic nevi of left lower limb, including hip: Secondary | ICD-10-CM | POA: Diagnosis not present

## 2018-01-21 DIAGNOSIS — D2262 Melanocytic nevi of left upper limb, including shoulder: Secondary | ICD-10-CM | POA: Diagnosis not present

## 2018-01-21 DIAGNOSIS — Z08 Encounter for follow-up examination after completed treatment for malignant neoplasm: Secondary | ICD-10-CM | POA: Diagnosis not present

## 2018-01-21 DIAGNOSIS — D2271 Melanocytic nevi of right lower limb, including hip: Secondary | ICD-10-CM | POA: Diagnosis not present

## 2018-01-21 DIAGNOSIS — Z85820 Personal history of malignant melanoma of skin: Secondary | ICD-10-CM | POA: Diagnosis not present

## 2018-01-21 DIAGNOSIS — D225 Melanocytic nevi of trunk: Secondary | ICD-10-CM | POA: Diagnosis not present

## 2018-02-20 ENCOUNTER — Other Ambulatory Visit: Payer: Self-pay | Admitting: Internal Medicine

## 2018-03-12 ENCOUNTER — Ambulatory Visit (INDEPENDENT_AMBULATORY_CARE_PROVIDER_SITE_OTHER): Payer: Medicare Other | Admitting: Cardiovascular Disease

## 2018-03-12 ENCOUNTER — Encounter: Payer: Self-pay | Admitting: Cardiovascular Disease

## 2018-03-12 VITALS — BP 140/64 | HR 72 | Ht 60.0 in | Wt 130.8 lb

## 2018-03-12 DIAGNOSIS — I10 Essential (primary) hypertension: Secondary | ICD-10-CM

## 2018-03-12 DIAGNOSIS — I5022 Chronic systolic (congestive) heart failure: Secondary | ICD-10-CM

## 2018-03-12 NOTE — Progress Notes (Signed)
Cardiology Office Note   Date:  03/12/2018   ID:  [name], DOB [birth year], MRN [id number]  PCP:  Crecencio Mc, MD  Cardiologist:   Kathlyn Sacramento, MD   Chief Complaint  Patient presents with  . other    12 mo follow up. Medications verbally reviewed."Doing well"      History of Present Illness: [name] is a 84 y.o. female who presents for a follow up visit regarding nonischemic cardiomyopathy. She had cardiac catheterization in May 2014 which showed mild nonobstructive coronary artery disease involving the LAD and left circumflex. Ejection fraction was normal.  She does have left bundle branch block. Echocardiogram in November 2015 showed an ejection fraction of 45-50%, mild-to-moderate aortic regurgitation, mild mitral regurgitation and mild-to-moderate tricuspid regurgitation. Pulmonary pressure was normal.  She has been doing well with no chest pain or shortness of breath.  Unfortunately, her husband died last year after a long struggle with chronic medical conditions including heart failure.  Past Medical History:  Diagnosis Date  . Hyperlipidemia   . Hypertension     Past Surgical History:  Procedure Laterality Date  . ABDOMINAL HYSTERECTOMY    . ankle     rods in both ankles  . BREAST BIOPSY Bilateral YRS AGO   NEG  . CARDIAC CATHETERIZATION     [facility]  . left ankle  Octo 2012   s/p pinning , Krazinski  . LEFT HEART CATHETERIZATION WITH CORONARY ANGIOGRAM N/A 10/31/2012   Procedure: LEFT HEART CATHETERIZATION WITH CORONARY ANGIOGRAM;  Surgeon: Clent Demark, MD;  Location: [facility] CATH LAB;  Service: Cardiovascular;  Laterality: N/A;     Current Outpatient Medications  Medication Sig Dispense Refill  . amLODipine (NORVASC) 5 MG tablet TAKE 1 TABLET BY MOUTH TWICE DAILY 180 tablet 1  . aspirin 81 MG tablet Take 81 mg by mouth daily.      . Calcium Carbonate-Vitamin D (CALCIUM + D) 600-200 MG-UNIT TABS Take by mouth.      . fish oil-omega-3 fatty  acids 1000 MG capsule Take by mouth daily.      Marland Kitchen lisinopril (PRINIVIL,ZESTRIL) 40 MG tablet TAKE 1 TABLET BY MOUTH DAILY 90 tablet 1  . MELATONIN PO Take 6 mg by mouth at bedtime as needed.    . metoprolol succinate (TOPROL-XL) 25 MG 24 hr tablet TAKE 1 TABLET DAILY 90 tablet 1  . Multiple Vitamin (MULTIVITAMIN) tablet Take 1 tablet by mouth daily.       No current facility-administered medications for this visit.     Allergies:   Sulfa antibiotics and Contrast media [iodinated diagnostic agents]    Social History:  The patient  reports that she has never smoked. She has never used smokeless tobacco. She reports that she does not drink alcohol or use drugs.   Family History:  The patient's family history includes Cancer in her father; Heart attack in her brother and mother; Hypertension in her father and mother; Stroke in her sister.    ROS:  Please see the history of present illness.   Otherwise, review of systems are positive for none.   All other systems are reviewed and negative.    PHYSICAL EXAM: VS:  BP 140/64 (BP Location: Left Arm, Patient Position: Sitting, Cuff Size: Normal)   Pulse 72   Ht 5' (1.524 m)   Wt 130 lb 12 oz (59.3 kg)   BMI 25.54 kg/m  , BMI Body mass index is 25.54 kg/m. GEN: Well nourished,  well developed, in no acute distress  HEENT: normal  Neck: no JVD, carotid bruits, or masses Cardiac: RRR; no rubs, or gallops,no edema . 1/ 6 systolic murmur in the aortic area Respiratory:  clear to auscultation bilaterally, normal work of breathing GI: soft, nontender, nondistended, + BS MS: no deformity or atrophy  Skin: warm and dry, no rash Neuro:  Strength and sensation are intact Psych: euthymic mood, full affect   EKG:  EKG is ordered today. The ekg ordered today demonstrates normal sinus rhythm with left bundle branch block.   Recent Labs: 12/03/2017: ALT 13; BUN 21; Creatinine, Ser 0.93; Potassium 4.6; Sodium 140; TSH 2.70    Lipid Panel      Component Value Date/Time   CHOL 285 (H) 12/03/2017 0938   CHOL 226 (H) 09/16/2012 0542   TRIG 125.0 12/03/2017 0938   TRIG 109 09/16/2012 0542   HDL 60.30 12/03/2017 0938   HDL 60 09/16/2012 0542   CHOLHDL 5 12/03/2017 0938   VLDL 25.0 12/03/2017 0938   VLDL 22 09/16/2012 0542   LDLCALC 200 (H) 12/03/2017 0938   LDLCALC 144 (H) 09/16/2012 0542   LDLDIRECT 159.0 04/07/2015 0957      Wt Readings from Last 3 Encounters:  03/12/18 130 lb 12 oz (59.3 kg)  12/03/17 129 lb 9.6 oz (58.8 kg)  06/04/17 126 lb 9.6 oz (57.4 kg)        ASSESSMENT AND PLAN:  1.  Chronic systolic heart failure: Due to nonischemic cardiomyopathy. Ejection fraction was 45-50%. Currently New York Heart Association class II. No evidence of volume overload. Continue  Current medications.  She is on metoprolol and lisinopril.  2. Essential hypertension: Blood pressure is well control on current medications.   Disposition:   FU with me in 12 months  Signed,  Kathlyn Sacramento, MD  03/12/2018 2:44 PM    [place]

## 2018-03-12 NOTE — Patient Instructions (Signed)
Medication Instructions: Your physician recommends that you continue on your current medications as directed. Please refer to the Current Medication list given to you today.  If you need a refill on your cardiac medications before your next appointment, please call your pharmacy.   Follow-Up: Your physician wants you to follow-up in 12 months with Dr. Arida. You will receive a reminder letter in the mail two months in advance. If you don't receive a letter, please call our office at [phone] to schedule this follow-up appointment.  Thank you for choosing [facility]!    

## 2018-03-12 NOTE — Addendum Note (Signed)
Addended by: Alba Destine on: 03/12/2018 04:38 PM   Modules accepted: Orders

## 2018-04-03 ENCOUNTER — Ambulatory Visit (INDEPENDENT_AMBULATORY_CARE_PROVIDER_SITE_OTHER): Payer: Medicare Other

## 2018-04-03 VITALS — BP 128/70 | HR 59 | Temp 98.0°F | Resp 15 | Ht <= 58 in | Wt 128.8 lb

## 2018-04-03 DIAGNOSIS — Z23 Encounter for immunization: Secondary | ICD-10-CM | POA: Diagnosis not present

## 2018-04-03 DIAGNOSIS — Z0000 Encounter for general adult medical examination without abnormal findings: Secondary | ICD-10-CM | POA: Diagnosis not present

## 2018-04-03 NOTE — Progress Notes (Addendum)
Subjective:   [name] is a 84 y.o. female who presents for Medicare Annual (Subsequent) preventive examination.  Review of Systems:  No ROS.  Medicare Wellness Visit. Additional risk factors are reflected in the social history. Cardiac Risk Factors include: advanced age (>49men, >69 women);hypertension     Objective:     Vitals: BP 128/70 (BP Location: Left Arm, Patient Position: Sitting, Cuff Size: Normal)   Pulse (!) 59   Temp 98 F (36.7 C) (Oral)   Resp 15   Ht 4' 9.75" (1.467 m)   Wt 128 lb 12.8 oz (58.4 kg)   SpO2 94%   BMI 27.15 kg/m   Body mass index is 27.15 kg/m.  Advanced Directives 04/03/2018 04/02/2017 03/31/2016 03/31/2015 10/24/2014 10/31/2012  Does Patient Have a Medical Advance Directive? Yes Yes Yes Yes Yes Patient has advance directive, copy not in chart  Type of Advance Directive [place];Living will [place];Living will [place];Living will [place];Living will - -  Does patient want to make changes to medical advance directive? No - Patient declined No - Patient declined - No - Patient declined - -  Copy of [place] in Chart? No - copy requested No - copy requested No - copy requested No - copy requested No - copy requested -    Tobacco Social History   Tobacco Use  Smoking Status Never Smoker  Smokeless Tobacco Never Used     Counseling given: Not Answered   Clinical Intake:  Pre-visit preparation completed: Yes  Pain : No/denies pain     Nutritional Status: BMI 25 -29 Overweight Diabetes: No  How often do you need to have someone help you when you read instructions, pamphlets, or other written materials from your doctor or pharmacy?: 1 - Never  Interpreter Needed?: No     Past Medical History:  Diagnosis Date  . Hyperlipidemia   . Hypertension    Past Surgical History:  Procedure Laterality Date  . ABDOMINAL HYSTERECTOMY     . ankle     rods in both ankles  . BREAST BIOPSY Bilateral YRS AGO   NEG  . CARDIAC CATHETERIZATION     [facility]  . left ankle  Octo 2012   s/p pinning , Krazinski  . LEFT HEART CATHETERIZATION WITH CORONARY ANGIOGRAM N/A 10/31/2012   Procedure: LEFT HEART CATHETERIZATION WITH CORONARY ANGIOGRAM;  Surgeon: Clent Demark, MD;  Location: [facility] CATH LAB;  Service: Cardiovascular;  Laterality: N/A;   Family History  Problem Relation Age of Onset  . Hypertension Mother   . Heart attack Mother   . Hypertension Father   . Cancer Father        stomach  . Heart attack Brother   . Stroke Sister   . Breast cancer Neg Hx    Social History   Socioeconomic History  . Marital status: Widowed    Spouse name: Not on file  . Number of children: Not on file  . Years of education: Not on file  . Highest education level: Not on file  Occupational History  . Not on file  Social Needs  . Financial resource strain: Not hard at all  . Food insecurity:    Worry: Never true    Inability: Never true  . Transportation needs:    Medical: No    Non-medical: No  Tobacco Use  . Smoking status: Never Smoker  . Smokeless tobacco: Never Used  Substance and Sexual Activity  . Alcohol use: No  . Drug use: No  . Sexual activity: Not Currently  Lifestyle  . Physical activity:    Days per week: 0 days    Minutes per session: Not on file  . Stress: Not at all  Relationships  . Social connections:    Talks on phone: Not on file    Gets together: Not on file    Attends religious service: Not on file    Active member of club or organization: Not on file    Attends meetings of clubs or organizations: Not on file    Relationship status: Not on file  Other Topics Concern  . Not on file  Social History Narrative  . Not on file    Outpatient Encounter Medications as of 04/03/2018  Medication Sig  . amLODipine (NORVASC) 5 MG tablet TAKE 1 TABLET BY MOUTH TWICE DAILY  . aspirin 81 MG tablet Take 81 mg by  mouth daily.    . Calcium Carbonate-Vitamin D (CALCIUM + D) 600-200 MG-UNIT TABS Take by mouth.    . fish oil-omega-3 fatty acids 1000 MG capsule Take by mouth daily.    Marland Kitchen lisinopril (PRINIVIL,ZESTRIL) 40 MG tablet TAKE 1 TABLET BY MOUTH DAILY  . MELATONIN PO Take 6 mg by mouth at bedtime as needed.  . metoprolol succinate (TOPROL-XL) 25 MG 24 hr tablet TAKE 1 TABLET DAILY  . Multiple Vitamin (MULTIVITAMIN) tablet Take 1 tablet by mouth daily.    . Multiple Vitamins-Minerals (EYE VITAMINS PO) Take 1 tablet by mouth daily.   No facility-administered encounter medications on file as of 04/03/2018.     Activities of Daily Living In your present state of health, do you have any difficulty performing the following activities: 04/03/2018  Hearing? N  Vision? N  Difficulty concentrating or making decisions? N  Walking or climbing stairs? Y  Comment Difficulty climbing stairs or walking long distances. Hand rails in use as appropriate.   Dressing or bathing? N  Doing errands, shopping? N  Preparing Food and eating ? N  Using the Toilet? N  In the past six months, have you accidently leaked urine? Y  Comment Managed with daily liner  Do you have problems with loss of bowel control? N  Managing your Medications? N  Managing your Finances? N  Housekeeping or managing your Housekeeping? N  Some recent data might be hidden    Patient Care Team: Crecencio Mc, MD as PCP - General (Internal Medicine)    Assessment:   This is a routine wellness examination for [name].  The goal of the wellness visit is to assist the patient how to close the gaps in care and create a preventative care plan for the patient.   Patients states she is doing well overall. No acute issues for provider to address today. She misses staying active on Saturdays.  Encouraged to use that as he gym day for exercise. She was agreeable.   Taking calcium VIT D as appropriate/Osteoporosis risk reviewed.    Safety issues  reviewed; Life alert, smoke and carbon monoxide detectors in the home. No firearms in the home. Wears seatbelts when driving or riding with others. No violence in the home.  They do not have excessive sun exposure.  Discussed the need for sun protection: hats, long sleeves and the use of sunscreen if there is significant sun exposure.  Patient is alert, normal appearance, oriented to person/place/and time.  Correctly identified the president of  the [place] and recalls of 2/3 words. Performs simple calculations and can read correct time from watch face.  Displays appropriate judgement.  No new identified risk were noted.  No failures at ADL's or IADL's.    BMI- discussed the importance of a healthy diet, water intake and the benefits of aerobic exercise. Educational material provided.   24 hour diet recall: Regular diet  Dental- every 6 months.  Sleep patterns- Sleeps about 6 hours. Taking melatonin as directed. Wakes feeling rested.   High dose influenza vaccine administered L deltoid, tolerated well. No verbal complaint during or post administration. Educational material provided.  Health maintenance gaps- closed.  Patient Concerns: None at this time. Follow up with PCP as needed.  Exercise Activities and Dietary recommendations Current Exercise Habits: The patient does not participate in regular exercise at present  Goals      Patient Stated   . Increase physical activity (pt-stated)     Go to the gym and use the stationary bike for exercise     . Weight (lb) < 121 lb (54.9 kg) (pt-stated)       Fall Risk Fall Risk  04/03/2018 04/02/2017 03/31/2016 03/31/2015 03/23/2014  Falls in the past year? No No No No No   Depression Screen PHQ 2/9 Scores 04/03/2018 04/02/2017 03/31/2016 03/31/2015  PHQ - 2 Score 0 0 0 0  PHQ- 9 Score - 2 - -     Cognitive Function MMSE - Mini Mental State Exam 03/31/2016 03/31/2015  Orientation to time 5 5  Orientation to Place 5 5    Registration 3 3  Attention/ Calculation 5 5  Recall 3 3  Language- name 2 objects 2 2  Language- repeat 1 1  Language- follow 3 step command 3 3  Language- read & follow direction 1 1  Write a sentence 1 1  Copy design 1 1  Total score 30 30     6CIT Screen 04/03/2018 04/02/2017  What Year? 0 points 0 points  What month? 0 points 0 points  What time? 0 points 0 points  Count back from 20 0 points 0 points  Months in reverse 0 points 2 points  Repeat phrase 0 points 0 points  Total Score 0 2    Immunization History  Administered Date(s) Administered  . Influenza Split 03/20/2011, 03/13/2012, 03/09/2014  . Influenza, High Dose Seasonal PF 04/03/2018  . Influenza,inj,Quad PF,6+ Mos 03/31/2015  . Influenza-Unspecified 03/14/2013, 03/07/2014, 03/20/2016, 03/19/2017  . Pneumococcal Conjugate-13 03/23/2014  . Pneumococcal Polysaccharide-23 03/19/2010  . Tdap 04/01/2013  . Zoster 03/20/2011  . Zoster Recombinat (Shingrix) 01/26/2018   Screening Tests Health Maintenance  Topic Date Due  . DEXA SCAN  05/17/2018 (Originally 10/29/1989)  . TETANUS/TDAP  04/02/2023  . INFLUENZA VACCINE  Completed  . PNA vac Low Risk Adult  Completed      Plan:    End of life planning; Advance aging; Advanced directives discussed. Copy of current HCPOA/Living Will requested.    I have personally reviewed and noted the following in the patient's chart:   . Medical and social history . Use of alcohol, tobacco or illicit drugs  . Current medications and supplements . Functional ability and status . Nutritional status . Physical activity . Advanced directives . List of other physicians . Hospitalizations, surgeries, and ER visits in previous 12 months . Vitals . Screenings to include cognitive, depression, and falls . Referrals and appointments  In addition, I have reviewed and discussed with patient certain preventive protocols,  quality metrics, and best practice recommendations. A  written personalized care plan for preventive services as well as general preventive health recommendations were provided to patient.     OBrien-Blaney, Shyanne Mcclary L, LPN  [id number]    I have reviewed the above information and agree with above.   Deborra Medina, MD

## 2018-04-03 NOTE — Patient Instructions (Addendum)
  Ms. Mccarroll , Thank you for taking time to come for your Medicare Wellness Visit. I appreciate your ongoing commitment to your health goals. Please review the following plan we discussed and let me know if I can assist you in the future.   These are the goals we discussed: Goals      Patient Stated   . Increase physical activity (pt-stated)     Go to the gym and use the stationary bike for exercise     . Weight (lb) < 121 lb (54.9 kg) (pt-stated)       This is a list of the screening recommended for you and due dates:  Health Maintenance  Topic Date Due  . DEXA scan (bone density measurement)  05/17/2018*  . Tetanus Vaccine  04/02/2023  . Flu Shot  Completed  . Pneumonia vaccines  Completed  *Topic was postponed. The date shown is not the original due date.

## 2018-04-15 DIAGNOSIS — H6123 Impacted cerumen, bilateral: Secondary | ICD-10-CM | POA: Diagnosis not present

## 2018-04-15 DIAGNOSIS — H93299 Other abnormal auditory perceptions, unspecified ear: Secondary | ICD-10-CM | POA: Diagnosis not present

## 2018-04-19 ENCOUNTER — Other Ambulatory Visit: Payer: Self-pay | Admitting: Internal Medicine

## 2018-05-07 ENCOUNTER — Other Ambulatory Visit: Payer: Self-pay

## 2018-05-10 ENCOUNTER — Other Ambulatory Visit: Payer: Self-pay | Admitting: Internal Medicine

## 2018-05-10 DIAGNOSIS — Z1231 Encounter for screening mammogram for malignant neoplasm of breast: Secondary | ICD-10-CM

## 2018-06-18 ENCOUNTER — Other Ambulatory Visit: Payer: Self-pay | Admitting: Internal Medicine

## 2018-07-01 ENCOUNTER — Ambulatory Visit
Admission: RE | Admit: 2018-07-01 | Discharge: 2018-07-01 | Disposition: A | Discharge status: Home / Self Care | Payer: Medicare Other | Source: Ambulatory Visit | Attending: Internal Medicine | Admitting: Internal Medicine

## 2018-07-01 DIAGNOSIS — Z1231 Encounter for screening mammogram for malignant neoplasm of breast: Secondary | ICD-10-CM | POA: Insufficient documentation

## 2018-07-30 DIAGNOSIS — H2513 Age-related nuclear cataract, bilateral: Secondary | ICD-10-CM | POA: Diagnosis not present

## 2018-09-18 ENCOUNTER — Other Ambulatory Visit: Payer: Self-pay | Admitting: Internal Medicine

## 2018-10-15 ENCOUNTER — Other Ambulatory Visit: Payer: Self-pay | Admitting: Internal Medicine

## 2018-10-15 MED ORDER — TELMISARTAN 80 MG PO TABS: 80.0000 mg | ORAL_TABLET | Freq: Every day | ORAL | 2 refills | Status: DC

## 2018-10-15 NOTE — Telephone Encounter (Signed)
Please NOTIFY PATIENT OF THE FOLLOWING    I am making a decision to change your lisinopril to telmisartan, based on increased reports by one of my ENT colleagues of patients  developing tongue and throat swelling from lisinopril.  The condition , called "angioedema," can be fatal if a person's airway is compromised.  I also want you to take it at night instead of morning,  s recent studies have shown that taking your blood pressure medications at night protects you better from heart attacks and strokes.   Regards,   Deborra Medina, MD

## 2018-10-15 NOTE — Telephone Encounter (Signed)
Spoke with pt and informed her that her blodd pressure medication has been changed and why it has been changed based off of Dr. Lupita Dawn message below. Pt gave a verbal understanding.

## 2018-10-15 NOTE — Telephone Encounter (Signed)
Refill request for lisinopril.

## 2018-11-18 DIAGNOSIS — G459 Transient cerebral ischemic attack, unspecified: Secondary | ICD-10-CM

## 2018-11-18 HISTORY — DX: Transient cerebral ischemic attack, unspecified: G45.9

## 2018-11-25 ENCOUNTER — Telehealth: Payer: Self-pay | Admitting: *Deleted

## 2018-11-25 NOTE — Telephone Encounter (Signed)
Pt stated that yesterday she was talking to friend on the phone around 4:30pm and all of the sudden the friend stated that she couldn't understand the pt. The pt stated that she had difficulty getting her words out and felt like everything was jumbled together. The pt stated that it only lasted for about one minute and then everything was back to normal. The pt stated that she had no facial change or any other symptoms. Pt stated that she looked in the mirror because it did concern her that she could of had a stroke. Then pt also got to thinking about the change in her blood pressure medication from lisinopril to Telmisartan. The pt stated that she has been on the Telmisartan for about a week now and has not had any problems. Pt stated that yesterday morning her blood pressure was 117/60 and last night was 138/75 and then this morning it was 147/69. Pt stated that she woke up this morning feeling fine and still feels fine this evening.

## 2018-11-25 NOTE — Telephone Encounter (Signed)
Called and advised patient she needs to go to ER any more symptoms before appointment will not se anyone but PCP appointment Wednesday at 8 AM. FYI

## 2018-11-25 NOTE — Telephone Encounter (Signed)
Patient needs Dr Lupita Dawn nurse to call her- she wants to discuss her medication change. Patient had an episode with it yesterday and she would like to discuss that with Dr Lupita Dawn nurse.

## 2018-11-25 NOTE — Telephone Encounter (Signed)
Needs appt asap can be virtual

## 2018-11-27 ENCOUNTER — Ambulatory Visit (INDEPENDENT_AMBULATORY_CARE_PROVIDER_SITE_OTHER): Payer: Medicare Other | Admitting: Internal Medicine

## 2018-11-27 ENCOUNTER — Other Ambulatory Visit: Payer: Self-pay

## 2018-11-27 ENCOUNTER — Telehealth: Payer: Self-pay

## 2018-11-27 DIAGNOSIS — G459 Transient cerebral ischemic attack, unspecified: Secondary | ICD-10-CM

## 2018-11-27 MED ORDER — CLOPIDOGREL BISULFATE 75 MG PO TABS
75.0000 mg | ORAL_TABLET | Freq: Every day | ORAL | 3 refills | Status: DC
Start: 2018-11-27 — End: 2019-11-19

## 2018-11-27 NOTE — Assessment & Plan Note (Signed)
Continue asa,  Adding plavix  Statin intolerant.  MR/MRA and carotid dopplers ordered.  Dr Fletcher Anon to to EKG to [facility] out arrhythmia.  Fasting labs needed as it has been nearly a year  Lab Results  Component Value Date   CHOL 285 (H) 12/03/2017   HDL 60.30 12/03/2017   LDLCALC 200 (H) 12/03/2017   LDLDIRECT 159.0 04/07/2015   TRIG 125.0 12/03/2017   CHOLHDL 5 12/03/2017

## 2018-11-27 NOTE — Progress Notes (Signed)
Telephone Note  This visit type was conducted due to national recommendations for restrictions regarding the COVID-19 pandemic (e.g. social distancing).  This format is felt to be most appropriate for this patient at this time.  All issues noted in this document were discussed and addressed.  No physical exam was performed (except for noted visual exam findings with Video Visits).   I connected with@ on 11/27/18 at  8:00 AM EDT by  telephone and verified that I am speaking with the correct person using two identifiers. Location patient: home Location provider: work or home office Persons participating in the virtual visit: patient, provider  I discussed the limitations, risks, security and privacy concerns of performing an evaluation and management service by telephone and the availability of in person appointments. I also discussed with the patient that there may be a patient responsible charge related to this service. The patient expressed understanding and agreed to proceed.  Reason for visit: PROBABLE TIA   HPI:  85 YR OLD very active female with nonischemic cardiomyopathy,  Hypertension and statin intolerance presents with recent episode of dysarthria 3 days ago (March 7) HAT LASTED ABOUT A MINUTE WHILE TALKING TO A FRIEND ON THE TELEPHONE.  Symptoms were not accompanied  by headache, vision changes, facial droop or unilateral weakness.  She has had no subsequent episode s and states that she feels fine.  BP has been monitored at home and was < 150/90 on 3 drug regimen ,  Amlodipine and metoprolol and telmisartan    ROS: See pertinent positives and negatives per HPI.  Past Medical History:  Diagnosis Date  . Hyperlipidemia   . Hypertension     Past Surgical History:  Procedure Laterality Date  . ABDOMINAL HYSTERECTOMY    . ankle     rods in both ankles  . BREAST BIOPSY Bilateral YRS AGO   NEG  . CARDIAC CATHETERIZATION     [facility]  . left ankle  Octo 2012   s/p pinning , Krazinski   . LEFT HEART CATHETERIZATION WITH CORONARY ANGIOGRAM N/A 10/31/2012   Procedure: LEFT HEART CATHETERIZATION WITH CORONARY ANGIOGRAM;  Surgeon: Clent Demark, MD;  Location: [facility] CATH LAB;  Service: Cardiovascular;  Laterality: N/A;    Family History  Problem Relation Age of Onset  . Hypertension Mother   . Heart attack Mother   . Hypertension Father   . Cancer Father        stomach  . Heart attack Brother   . Stroke Sister   . Breast cancer Neg Hx     SOCIAL HX: widowed last year,  Lives alone,  IADL no tobacco or alcohol use   Current Outpatient Medications:  .  amLODipine (NORVASC) 5 MG tablet, TAKE ONE TABLET TWICE DAILY, Disp: 180 tablet, Rfl: 1 .  aspirin 81 MG tablet, Take 81 mg by mouth daily.  , Disp: , Rfl:  .  Calcium Carbonate-Vitamin D (CALCIUM + D) 600-200 MG-UNIT TABS, Take by mouth.  , Disp: , Rfl:  .  fish oil-omega-3 fatty acids 1000 MG capsule, Take by mouth daily.  , Disp: , Rfl:  .  MELATONIN PO, Take 6 mg by mouth at bedtime as needed., Disp: , Rfl:  .  metoprolol succinate (TOPROL-XL) 25 MG 24 hr tablet, TAKE ONE TABLET EVERY DAY, Disp: 90 tablet, Rfl: 1 .  Multiple Vitamin (MULTIVITAMIN) tablet, Take 1 tablet by mouth daily.  , Disp: , Rfl:  .  Multiple Vitamins-Minerals (EYE VITAMINS PO), Take 1 tablet  by mouth daily., Disp: , Rfl:  .  telmisartan (MICARDIS) 80 MG tablet, Take 1 tablet (80 mg total) by mouth at bedtime., Disp: 30 tablet, Rfl: 2 .  clopidogrel (PLAVIX) 75 MG tablet, Take 1 tablet (75 mg total) by mouth daily., Disp: 90 tablet, Rfl: 3  EXAM:  General impression: alert, cooperative and articulate.  No signs of being in distress  Lungs: speech is fluent sentence length suggests that patient is not short of breath and not punctuated by cough, sneezing or sniffing. Marland Kitchen   Psych: affect normal.  speech is articulate and non pressured .  Denies suicidal thoughts   ASSESSMENT AND PLAN:  Discussed the following assessment and plan:  TIA (transient  ischemic attack) - Plan: MR MRA HEAD WO CONTRAST, Lipid panel, CBC with Differential/Platelet, Comprehensive metabolic panel, US Carotid Duplex Bilateral  TIA (transient ischemic attack) Continue asa,  Adding plavix  Statin intolerant.  MR/MRA and carotid dopplers ordered.  Dr Fletcher Anon to to EKG to [facility] out arrhythmia.  Fasting labs needed as it has been nearly a year  Lab Results  Component Value Date   CHOL 285 (H) 12/03/2017   HDL 60.30 12/03/2017   LDLCALC 200 (H) 12/03/2017   LDLDIRECT 159.0 04/07/2015   TRIG 125.0 12/03/2017   CHOLHDL 5 12/03/2017       I discussed the assessment and treatment plan with the patient. The patient was provided an opportunity to ask questions and all were answered. The patient agreed with the plan and demonstrated an understanding of the instructions.   The patient was advised to call back or seek an in-person evaluation if the symptoms worsen or if the condition fails to improve as anticipated.  I provided 25 minutes of non-face-to-face time during this encounter.   Crecencio Mc, MD

## 2018-11-27 NOTE — Progress Notes (Signed)
[name] Dr.Tullo ordered a MRI/MRA for TIA that she wants scheduled.

## 2018-11-27 NOTE — Progress Notes (Signed)
appt scheduled for labs and message sent to [facility] for MRI/MRA.  Nina,cma

## 2018-11-27 NOTE — Telephone Encounter (Signed)
Secure chat message received from Dr. Fletcher Anon. This pt is to be scheduled to have carotid dopp at our office ordered by her pcp Dr.Tullo. The pt is to have an EKG when she comes in for her test. A message has been sent to scheduling to schedule. Per Dr. Fletcher Anon if NSR she will need a 14 day zio-monitor Dx Afib.

## 2018-11-27 NOTE — Progress Notes (Signed)
She is scheduled tomorrow (June 11) at 12 for MRA and her carotid u/s will be done after. She is aware of her appointment. Thx! Melissa

## 2018-11-28 ENCOUNTER — Ambulatory Visit
Admission: RE | Admit: 2018-11-28 | Discharge: 2018-11-28 | Disposition: A | Discharge status: Home / Self Care | Payer: Medicare Other | Source: Ambulatory Visit | Attending: Internal Medicine | Admitting: Internal Medicine

## 2018-11-28 ENCOUNTER — Telehealth: Payer: Self-pay

## 2018-11-28 DIAGNOSIS — G459 Transient cerebral ischemic attack, unspecified: Secondary | ICD-10-CM | POA: Insufficient documentation

## 2018-11-28 DIAGNOSIS — I639 Cerebral infarction, unspecified: Secondary | ICD-10-CM | POA: Diagnosis not present

## 2018-11-28 DIAGNOSIS — I672 Cerebral atherosclerosis: Secondary | ICD-10-CM | POA: Diagnosis not present

## 2018-11-28 NOTE — Progress Notes (Signed)
Thank you :)

## 2018-11-28 NOTE — Telephone Encounter (Signed)
Order for MRI on brain.

## 2018-12-02 ENCOUNTER — Ambulatory Visit
Admission: RE | Admit: 2018-12-02 | Discharge: 2018-12-02 | Disposition: A | Discharge status: Home / Self Care | Payer: Medicare Other | Source: Ambulatory Visit | Attending: Internal Medicine | Admitting: Internal Medicine

## 2018-12-02 ENCOUNTER — Other Ambulatory Visit: Payer: Self-pay

## 2018-12-02 DIAGNOSIS — R471 Dysarthria and anarthria: Secondary | ICD-10-CM | POA: Diagnosis not present

## 2018-12-02 DIAGNOSIS — G459 Transient cerebral ischemic attack, unspecified: Secondary | ICD-10-CM | POA: Insufficient documentation

## 2018-12-03 ENCOUNTER — Other Ambulatory Visit: Payer: Medicare Other

## 2018-12-05 ENCOUNTER — Encounter: Payer: Self-pay | Admitting: Physician Assistant

## 2018-12-05 ENCOUNTER — Telehealth: Payer: Self-pay | Admitting: Physician Assistant

## 2018-12-05 NOTE — Telephone Encounter (Signed)

## 2018-12-05 NOTE — Progress Notes (Signed)
Cardiology Office Note Date:  12/06/2018  Patient ID:  [name], [name] 04-05-1923, MRN [id number] PCP:  Crecencio Mc, MD  Cardiologist:  Dr. Fletcher Anon, MD    Chief Complaint: Possible recent TIA  History of Present Illness: [name] is a 85 y.o. female with history of nonobstructive CAD by cardiac cath in 10/2012, HFrEF secondary to nonischemic cardiomyopathy, LBBB, valvular heart disease, hypertension, significant hyperlipidemia intolerant to statin therapy, and possible recent TIA who presents for evaluation of TIA.  Prior cardiac cath in 10/2012 showed mild nonobstructive CAD involving the LAD and LCx.  Echo in 04/2014 showed an EF of 45 to 50%, mild to moderate aortic regurgitation, mild mitral regurgitation, mild to moderate tricuspid regurgitation, normal PASP.  She was last seen by Dr. Fletcher Anon in 02/2018 and was doing well from a cardiac perspective.  She was recently seen in telemedicine by PCP on 11/27/2018 with possible TIA like symptoms with episode of dysarthria 3 days prior lasted for approximately 1 minute while talking to a friend on the phone.  Plavix was added to her aspirin.  Carotid artery ultrasound on 11/28/2018 showed 1 to 49% bilateral proximal ICA stenoses with patent antegrade vertebral artery flow.  MRI of the brain on 12/02/2018 showed no evidence of acute or subacute infarct with mild chronic small vessel ischemic disease.  MRA of the head showed widespread intracranial atherosclerotic disease with concerning tandem flow-limiting stenoses of the proximal right MCA and proximal left PCA.  She comes in doing well from a cardiac perspective today.  No chest pain, shortness of breath, palpitations, dizziness, presyncope, or syncope.  No lower extremity swelling, abdominal distention, orthopnea, PND, early satiety.  No falls since she was last seen.  No BRBPR or melena.  She indicates earlier this month she was talking on the phone with a friend and had a less than 1 minute  episode of dysarthria.  She denies any associated facial droop, vision changes, paresthesias, or focal weakness.  Dysarthria spontaneously resolved.  She has been asymptomatic since.  Work-up has revealed the above.  She comes in today without any active complaints.  Labs: 11/2018 - total cholesterol 291, triglyceride 180, HDL 54, LDL 200, Hgb 15, potassium 4.0, serum creatinine 0.98, AST/ALT normal, albumin 4.2 11/2017 - TSH normal   Past Medical History:  Diagnosis Date  . Carotid artery disease ([place])   . Coronary artery disease, non-occlusive   . Hyperlipidemia   . Hypertension   . Left bundle branch block   . Nonischemic cardiomyopathy [facility])     Past Surgical History:  Procedure Laterality Date  . ABDOMINAL HYSTERECTOMY    . ankle     rods in both ankles  . BREAST BIOPSY Bilateral YRS AGO   NEG  . CARDIAC CATHETERIZATION     [facility]  . left ankle  Octo 2012   s/p pinning , Krazinski  . LEFT HEART CATHETERIZATION WITH CORONARY ANGIOGRAM N/A 10/31/2012   Procedure: LEFT HEART CATHETERIZATION WITH CORONARY ANGIOGRAM;  Surgeon: Clent Demark, MD;  Location: [facility] CATH LAB;  Service: Cardiovascular;  Laterality: N/A;    Current Meds  Medication Sig  . amLODipine (NORVASC) 5 MG tablet TAKE ONE TABLET TWICE DAILY  . aspirin 81 MG tablet Take 81 mg by mouth daily.    . Calcium Carbonate-Vitamin D (CALCIUM + D) 600-200 MG-UNIT TABS Take by mouth.    . clopidogrel (PLAVIX) 75 MG tablet Take 1 tablet (75 mg total) by mouth daily.  Marland Kitchen  fish oil-omega-3 fatty acids 1000 MG capsule Take by mouth daily.    Marland Kitchen MELATONIN PO Take 6 mg by mouth at bedtime as needed.  . metoprolol succinate (TOPROL-XL) 25 MG 24 hr tablet TAKE ONE TABLET EVERY DAY  . Multiple Vitamin (MULTIVITAMIN) tablet Take 1 tablet by mouth daily.    . Multiple Vitamins-Minerals (EYE VITAMINS PO) Take 1 tablet by mouth daily.  Marland Kitchen telmisartan (MICARDIS) 80 MG tablet Take 1 tablet (80 mg total) by mouth at bedtime.    Allergies:    Sulfa antibiotics and Contrast media [iodinated diagnostic agents]   Social History:  The patient  reports that she has never smoked. She has never used smokeless tobacco. She reports that she does not drink alcohol or use drugs.   Family History:  The patient's family history includes Cancer in her father; Heart attack in her brother and mother; Hypertension in her father and mother; Stroke in her sister.  ROS:   Review of Systems  Constitutional: Negative for chills, diaphoresis, fever, malaise/fatigue and weight loss.  HENT: Negative for congestion.   Eyes: Negative for discharge and redness.  Respiratory: Negative for cough, hemoptysis, sputum production, shortness of breath and wheezing.   Cardiovascular: Negative for chest pain, palpitations, orthopnea, claudication, leg swelling and PND.  Gastrointestinal: Negative for abdominal pain, blood in stool, heartburn, melena, nausea and vomiting.  Genitourinary: Negative for hematuria.  Musculoskeletal: Negative for falls and myalgias.  Skin: Negative for rash.  Neurological: Positive for speech change. Negative for dizziness, tingling, tremors, sensory change, focal weakness, loss of consciousness and weakness.  Endo/Heme/Allergies: Does not bruise/bleed easily.  Psychiatric/Behavioral: Negative for substance abuse. The patient is not nervous/anxious.   All other systems reviewed and are negative.    PHYSICAL EXAM:  VS:  BP 130/64 (BP Location: Left Arm, Patient Position: Sitting, Cuff Size: Normal)   Pulse 72   Ht 5\' 1"  (1.549 m)   Wt 129 lb 4 oz (58.6 kg)   BMI 24.42 kg/m  BMI: Body mass index is 24.42 kg/m.  Physical Exam  Constitutional: She is oriented to person, place, and time. She appears well-developed and well-nourished.  HENT:  Head: Normocephalic and atraumatic.  Eyes: Right eye exhibits no discharge. Left eye exhibits no discharge.  Neck: Normal range of motion. No JVD present.  Cardiovascular: Normal rate, regular  rhythm, S1 normal and S2 normal. Exam reveals no distant heart sounds, no friction rub, no midsystolic click and no opening snap.  Murmur heard. High-pitched blowing holosystolic murmur is present with a grade of 1/6 at the apex. High-pitched blowing decrescendo early diastolic murmur is present with a grade of 1/6 at the upper right sternal border radiating to the apex. Pulses:      Posterior tibial pulses are 2+ on the right side and 2+ on the left side.  Pulmonary/Chest: Effort normal and breath sounds normal. No respiratory distress. She has no decreased breath sounds. She has no wheezes. She has no rales. She exhibits no tenderness.  Abdominal: Soft. She exhibits no distension. There is no abdominal tenderness.  Musculoskeletal:        General: No edema.  Neurological: She is alert and oriented to person, place, and time.  Skin: Skin is warm and dry. No cyanosis. Nails show no clubbing.  Psychiatric: She has a normal mood and affect. Her speech is normal and behavior is normal. Judgment and thought content normal.     EKG:  Was ordered and interpreted by me today. Shows NSR,  72 bpm, LBBB (known)  Recent Labs: 12/06/2018: ALT 13; BUN 18; Creatinine, Ser 0.98; Hemoglobin 15.0; Platelets 209.0; Potassium 4.0; Sodium 141  12/06/2018: Cholesterol 291; HDL 54.40; LDL Cholesterol 200; Total CHOL/HDL Ratio 5; Triglycerides 180.0; VLDL 36.0   Estimated Creatinine Clearance: 28.9 mL/min (by C-G formula based on SCr of 0.98 mg/dL).   Wt Readings from Last 3 Encounters:  12/06/18 129 lb 4 oz (58.6 kg)  04/03/18 128 lb 12.8 oz (58.4 kg)  03/12/18 130 lb 12 oz (59.3 kg)     Other studies reviewed: Additional studies/records reviewed today include: summarized above  ASSESSMENT AND PLAN:  1. Possible TIA with documented cerebrovascular disease: No residual symptoms.  Remains on aspirin and Plavix per PCP.  EKG shows sinus rhythm with known left bundle today.  Schedule 14-day real-time ZIO to  evaluate for A. fib.  If she is found to have A. fib burden recommend discontinuation of dual antiplatelet therapy and initiation of oral anticoagulation.  Continue current dose of Toprol and telmisartan as outlined below.  Aggressive risk factor modification is recommended, though she has documented intolerance to high intensity statins and Zetia.  2. Nonobstructive CAD: No symptoms concerning for angina.  Continue aspirin as outlined above.  On Plavix in the setting of presumed TIA.  Not currently on statin or Zetia secondary to intolerance.  No plans for ischemic evaluation at this time given lack of anginal symptoms and advanced age.  3. HFrEF secondary to nonischemic cardiomyopathy: She appears euvolemic on exam today and is well compensated.  Most recent echo from 04/2014 demonstrated an EF of 45 to 50%.  Remains on telmisartan and Toprol.  No plan for routine transthoracic echo at this time given it would be unlikely to change her management moving forward.  4. Hypertension: Blood pressure is reasonably controlled today.  Continue telmisartan, Toprol-XL, and amlodipine.  5. Hyperlipidemia: LDL remains significantly elevated with a value of 200 from today which is unchanged when compared to last year.  She is intolerant to high intensity statins as well as Zetia.  Discussed possible PCSK9 inhibitor versus bempedoic acid, and given the patient's advanced age it is unlikely she will benefit significantly from either of these financially constraining medications.  She agrees to defer further investigations into these therapies at this time.  6. Carotid artery disease: Carotid artery ultrasound from earlier this month demonstrated 1 to 49% bilateral ICA stenoses.  Intolerant to statin as above.  Discussed with Dr. Fletcher Anon  Disposition: F/u with Dr. Fletcher Anon or an APP in 12 months, sooner if needed.  Current medicines are reviewed at length with the patient today.  The patient did not have any concerns  regarding medicines.  Signed, Christell Faith, PA-C 12/06/2018 3:14 PM     [address] [place], [place] [address] [phone]

## 2018-12-06 ENCOUNTER — Other Ambulatory Visit: Payer: Self-pay

## 2018-12-06 ENCOUNTER — Telehealth: Payer: Self-pay | Admitting: *Deleted

## 2018-12-06 ENCOUNTER — Ambulatory Visit (INDEPENDENT_AMBULATORY_CARE_PROVIDER_SITE_OTHER): Payer: Medicare Other

## 2018-12-06 ENCOUNTER — Ambulatory Visit (INDEPENDENT_AMBULATORY_CARE_PROVIDER_SITE_OTHER): Payer: Medicare Other | Admitting: Physician Assistant

## 2018-12-06 ENCOUNTER — Encounter: Payer: Self-pay | Admitting: Physician Assistant

## 2018-12-06 ENCOUNTER — Other Ambulatory Visit (INDEPENDENT_AMBULATORY_CARE_PROVIDER_SITE_OTHER): Payer: Medicare Other

## 2018-12-06 VITALS — BP 130/64 | HR 72 | Ht 61.0 in | Wt 129.2 lb

## 2018-12-06 DIAGNOSIS — I2510 Atherosclerotic heart disease of native coronary artery without angina pectoris: Secondary | ICD-10-CM

## 2018-12-06 DIAGNOSIS — E785 Hyperlipidemia, unspecified: Secondary | ICD-10-CM | POA: Diagnosis not present

## 2018-12-06 DIAGNOSIS — G459 Transient cerebral ischemic attack, unspecified: Secondary | ICD-10-CM | POA: Diagnosis not present

## 2018-12-06 DIAGNOSIS — I10 Essential (primary) hypertension: Secondary | ICD-10-CM

## 2018-12-06 DIAGNOSIS — I428 Other cardiomyopathies: Secondary | ICD-10-CM

## 2018-12-06 DIAGNOSIS — I5022 Chronic systolic (congestive) heart failure: Secondary | ICD-10-CM

## 2018-12-06 DIAGNOSIS — I6781 Acute cerebrovascular insufficiency: Secondary | ICD-10-CM | POA: Diagnosis not present

## 2018-12-06 LAB — CBC WITH DIFFERENTIAL/PLATELET
Basophils Absolute: 0 10*3/uL (ref 0.0–0.1)
Basophils Relative: 0.5 % (ref 0.0–3.0)
Eosinophils Absolute: 0.3 10*3/uL (ref 0.0–0.7)
Eosinophils Relative: 4.1 % (ref 0.0–5.0)
HCT: 45 % (ref 36.0–46.0)
Hemoglobin: 15 g/dL (ref 12.0–15.0)
Lymphocytes Relative: 29.5 % (ref 12.0–46.0)
Lymphs Abs: 1.9 10*3/uL (ref 0.7–4.0)
MCHC: 33.3 g/dL (ref 30.0–36.0)
MCV: 94.3 fl (ref 78.0–100.0)
Monocytes Absolute: 0.7 10*3/uL (ref 0.1–1.0)
Monocytes Relative: 10.8 % (ref 3.0–12.0)
Neutro Abs: 3.6 10*3/uL (ref 1.4–7.7)
Neutrophils Relative %: 55.1 % (ref 43.0–77.0)
Platelets: 209 10*3/uL (ref 150.0–400.0)
RBC: 4.77 Mil/uL (ref 3.87–5.11)
RDW: 13.8 % (ref 11.5–15.5)
WBC: 6.5 10*3/uL (ref 4.0–10.5)

## 2018-12-06 LAB — LIPID PANEL
Cholesterol: 291 mg/dL — ABNORMAL HIGH (ref 0–200)
HDL: 54.4 mg/dL (ref >39.00)
LDL Cholesterol: 200 mg/dL — ABNORMAL HIGH (ref 0–99)
NonHDL: 236.12
Total CHOL/HDL Ratio: 5
Triglycerides: 180 mg/dL — ABNORMAL HIGH (ref 0.0–149.0)
VLDL: 36 mg/dL (ref 0.0–40.0)

## 2018-12-06 LAB — COMPREHENSIVE METABOLIC PANEL
ALT: 13 U/L (ref 0–35)
AST: 19 U/L (ref 0–37)
Albumin: 4.2 g/dL (ref 3.5–5.2)
Alkaline Phosphatase: 71 U/L (ref 39–117)
BUN: 18 mg/dL (ref 6–23)
CO2: 28 mEq/L (ref 19–32)
Calcium: 9.9 mg/dL (ref 8.4–10.5)
Chloride: 103 mEq/L (ref 96–112)
Creatinine, Ser: 0.98 mg/dL (ref 0.40–1.20)
GFR: 52.84 mL/min — ABNORMAL LOW (ref >60.00)
Glucose, Bld: 88 mg/dL (ref 70–99)
Potassium: 4 mEq/L (ref 3.5–5.1)
Sodium: 141 mEq/L (ref 135–145)
Total Bilirubin: 0.9 mg/dL (ref 0.2–1.2)
Total Protein: 6.7 g/dL (ref 6.0–8.3)

## 2018-12-06 NOTE — Telephone Encounter (Signed)
COVID-19 Pre-Screening Questions:   ?   In the past 7 to 10 days have you had a cough, shortness of breath, headache, congestion, fever (100 or greater) body aches, chills, sore throat, or sudden loss of taste or sense of smell?  NO  Have you been around anyone with known Covid 19.   Have you been around anyone who is awaiting Covid 19 test results in the past 7 to 10 days? NO   Have you been around anyone who has been exposed to Covid 19, or has mentioned symptoms of Covid 19 within the past 7 to 10 days? No  If you have any concerns/questions about symptoms patients report during screening (either on the phone or at threshold). Contact the provider seeing the patient or DOD for further guidance. If neither are available contact a member of the leadership team."

## 2018-12-06 NOTE — Telephone Encounter (Signed)
Just got around to doing the result notes messages that were sent to me yesterday. Pt is aware and appt has been scheduled.

## 2018-12-06 NOTE — Telephone Encounter (Signed)
Pt came in for labs this morning & states that she has not heard anything from the MRI she had recently.

## 2018-12-06 NOTE — Telephone Encounter (Signed)
The message was sent to jessica yesterday attached to the MRI results .  Epic seems to be dropping a lot of theses sine the upgrade,  I do not see it in the chart.  I also asked Janett Billow to schedule her a follow up appointment to discuss the results in detail .

## 2018-12-06 NOTE — Patient Instructions (Signed)
Medication Instructions:  Your physician recommends that you continue on your current medications as directed. Please refer to the Current Medication list given to you today.  If you need a refill on your cardiac medications before your next appointment, please call your pharmacy.   Lab work: None ordered  If you have labs (blood work) drawn today and your tests are completely normal, you will receive your results only by: Marland Kitchen MyChart Message (if you have MyChart) OR . A paper copy in the mail If you have any lab test that is abnormal or we need to change your treatment, we will call you to review the results.  Testing/Procedures: None ordered   Follow-Up: At [facility], you and your health needs are our priority.  As part of our continuing mission to provide you with exceptional heart care, we have created designated Provider Care Teams.  These Care Teams include your primary Cardiologist (physician) and Advanced Practice Providers (APPs -  Physician Assistants and Nurse Practitioners) who all work together to provide you with the care you need, when you need it. You will need a follow up appointment in 12 months.  Please call our office 2 months in advance to schedule this appointment.  Please see Dr. Fletcher Anon.

## 2018-12-09 ENCOUNTER — Ambulatory Visit (INDEPENDENT_AMBULATORY_CARE_PROVIDER_SITE_OTHER): Payer: Medicare Other | Admitting: Internal Medicine

## 2018-12-09 ENCOUNTER — Encounter: Payer: Self-pay | Admitting: Internal Medicine

## 2018-12-09 ENCOUNTER — Other Ambulatory Visit: Payer: Self-pay

## 2018-12-09 DIAGNOSIS — I672 Cerebral atherosclerosis: Secondary | ICD-10-CM

## 2018-12-09 DIAGNOSIS — I10 Essential (primary) hypertension: Secondary | ICD-10-CM | POA: Diagnosis not present

## 2018-12-09 DIAGNOSIS — G459 Transient cerebral ischemic attack, unspecified: Secondary | ICD-10-CM

## 2018-12-09 DIAGNOSIS — Z8673 Personal history of transient ischemic attack (TIA), and cerebral infarction without residual deficits: Secondary | ICD-10-CM

## 2018-12-09 NOTE — Assessment & Plan Note (Signed)
Diffuse atherosclerotic cerebrovascular disease by MRA.  Continue asa and plavix,  Statin and Zetia intolerant. Per cardiology there would be no real beneift to a PSK9 inhibitor given her age.  Awaiting results of ZIO to rule out atrial fib which would necessitate change in plavix to DOAC

## 2018-12-09 NOTE — Assessment & Plan Note (Signed)
Continue asa and plavix.  Statin intolerant.

## 2018-12-09 NOTE — Assessment & Plan Note (Signed)
Tolerating change from lisinopril to telmisartan

## 2018-12-09 NOTE — Progress Notes (Signed)
Telephone  Note  This visit type was conducted due to national recommendations for restrictions regarding the COVID-19 pandemic (e.g. social distancing).  This format is felt to be most appropriate for this patient at this time.  All issues noted in this document were discussed and addressed.  No physical exam was performed (except for noted visual exam findings with Video Visits).   I connected with@ on 12/09/18 at  9:00 AM EDT by telephone and verified that I am speaking with the correct person using two identifiers. Location patient: home Location provider: work or home office Persons participating in the virtual visit: patient, provider  I discussed the limitations, risks, security and privacy concerns of performing an evaluation and management service by telephone and the availability of in person appointments. I also discussed with the patient that there may be a patient responsible charge related to this service. The patient expressed understanding and agreed to proceed.  Reason for visit: follow up on workup for TIA  HPI:  85 yr old female with untreated hyperlipidemia secondary to statin intolerance presents for   1) discussion of results of TIA workup Significant atherosclerotic disease with flow limiting stenosis of proximal left PCA and proximal right MCA noted,  With < 49% stenosis of bilateral prox ICA. Tolerating asa and plavix,  Zio monitor in place .  Has not had any subsequent events   2) follow up on HTN:  Tolerating medication change from lisinopril to telmisartan .      ROS: See pertinent positives and negatives per HPI.  Past Medical History:  Diagnosis Date  . Carotid artery disease ([place])   . Coronary artery disease, non-occlusive   . Hyperlipidemia   . Hypertension   . Left bundle branch block   . Nonischemic cardiomyopathy [facility])     Past Surgical History:  Procedure Laterality Date  . ABDOMINAL HYSTERECTOMY    . ankle     rods in both ankles  . BREAST  BIOPSY Bilateral YRS AGO   NEG  . CARDIAC CATHETERIZATION     [facility]  . left ankle  Octo 2012   s/p pinning , Krazinski  . LEFT HEART CATHETERIZATION WITH CORONARY ANGIOGRAM N/A 10/31/2012   Procedure: LEFT HEART CATHETERIZATION WITH CORONARY ANGIOGRAM;  Surgeon: Clent Demark, MD;  Location: [facility] CATH LAB;  Service: Cardiovascular;  Laterality: N/A;    Family History  Problem Relation Age of Onset  . Hypertension Mother   . Heart attack Mother   . Hypertension Father   . Cancer Father        stomach  . Heart attack Brother   . Stroke Sister   . Breast cancer Neg Hx     SOCIAL HX:  reports that she has never smoked. She has never used smokeless tobacco. She reports that she does not drink alcohol or use drugs.   Current Outpatient Medications:  .  amLODipine (NORVASC) 5 MG tablet, TAKE ONE TABLET TWICE DAILY, Disp: 180 tablet, Rfl: 1 .  aspirin 81 MG tablet, Take 81 mg by mouth daily.  , Disp: , Rfl:  .  Calcium Carbonate-Vitamin D (CALCIUM + D) 600-200 MG-UNIT TABS, Take by mouth.  , Disp: , Rfl:  .  clopidogrel (PLAVIX) 75 MG tablet, Take 1 tablet (75 mg total) by mouth daily., Disp: 90 tablet, Rfl: 3 .  fish oil-omega-3 fatty acids 1000 MG capsule, Take by mouth daily.  , Disp: , Rfl:  .  metoprolol succinate (TOPROL-XL) 25 MG 24 hr tablet,  TAKE ONE TABLET EVERY DAY, Disp: 90 tablet, Rfl: 1 .  Multiple Vitamin (MULTIVITAMIN) tablet, Take 1 tablet by mouth daily.  , Disp: , Rfl:  .  Multiple Vitamins-Minerals (EYE VITAMINS PO), Take 1 tablet by mouth daily., Disp: , Rfl:  .  telmisartan (MICARDIS) 80 MG tablet, Take 1 tablet (80 mg total) by mouth at bedtime., Disp: 30 tablet, Rfl: 2  EXAM:   General impression: alert, cooperative and articulate.  No signs of being in distress  Lungs: speech is fluent sentence length suggests that patient is not short of breath and not punctuated by cough, sneezing or sniffing. Marland Kitchen   Psych: affect normal.  speech is articulate and non pressured .   Denies suicidal thoughts   ASSESSMENT AND PLAN:  Discussed the following assessment and plan:   TIA (transient ischemic attack) Diffuse atherosclerotic cerebrovascular disease by MRA.  Continue asa and plavix,  Statin and Zetia intolerant. Per cardiology there would be no real beneift to a PSK9 inhibitor given her age.  Awaiting results of ZIO to rule out atrial fib which would necessitate change in plavix to DOAC  Arteriosclerotic cerebrovascular disease Continue asa and plavix.  Statin intolerant.    Hypertension Tolerating change from lisinopril to telmisartan    I discussed the assessment and treatment plan with the patient. The patient was provided an opportunity to ask questions and all were answered. The patient agreed with the plan and demonstrated an understanding of the instructions.   The patient was advised to call back or seek an in-person evaluation if the symptoms worsen or if the condition fails to improve as anticipated.  I provided 22 minutes of non-face-to-face time during this encounter.   Crecencio Mc, MD

## 2018-12-09 NOTE — Patient Instructions (Signed)
Continue telmisartan for blood pressure  Continue asa and plavix to prevent strokes

## 2019-01-08 ENCOUNTER — Other Ambulatory Visit: Payer: Self-pay

## 2019-01-08 ENCOUNTER — Other Ambulatory Visit: Payer: Self-pay | Admitting: Internal Medicine

## 2019-01-14 ENCOUNTER — Telehealth: Payer: Self-pay

## 2019-01-14 MED ORDER — METOPROLOL SUCCINATE ER 25 MG PO TB24: 37.5000 mg | ORAL_TABLET | Freq: Every day | ORAL | 1 refills | Status: DC

## 2019-01-14 NOTE — Telephone Encounter (Signed)
Pt made aware of monitor results and Christell Faith, PA recommendation. Pt is agreeable with increasing metoprolol. Rx sent to the pt pharmacy. Pt adv to monitor her BP regularly and to contact the office if her readings are consistently low or high. Pt verbalized understanding.

## 2019-01-14 NOTE — Telephone Encounter (Signed)
-----   Message from Rise Mu, PA-C sent at 01/13/2019  3:31 PM EDT ----- Outpatient cardiac monitoring showed normal sinus rhythm with an average heart rate 66 bpm (minimum heart rate 45 bpm, maximum heart rate 176 bpm).  47 episodes of SVT were noted with the fastest and longest lasting 17 beats with a maximal rate of 176 bpm.  Rare PVCs.  No evidence of A. fib.  Recommend escalation of Toprol-XL to 37.5 mg daily.  If needed from a blood pressure perspective, we can taper her amlodipine or telmisartan.

## 2019-01-30 DIAGNOSIS — Z23 Encounter for immunization: Secondary | ICD-10-CM | POA: Diagnosis not present

## 2019-03-12 ENCOUNTER — Telehealth: Payer: Self-pay | Admitting: Physician Assistant

## 2019-03-12 NOTE — Telephone Encounter (Signed)
Please call pharmacy, patient is concerned that directions have changed in Metoprolol since she last got filled. Please advise.

## 2019-03-12 NOTE — Telephone Encounter (Signed)
Contacted the [facility] and spoke with [facility]. Patients most recent refill of Metoprolol was written for 25mg   Tablets. Take 1 and 1/2 tab (37.5mg ) daily.  Metoprolol was increased by Christiana Pellant, PA on 01/14/19.  Ryan's instructions:  No evidence of A. fib.  Recommend escalation of Toprol-XL to 37.5 mg daily.  If needed from a blood pressure perspective, we can taper her amlodipine or telmisartan.  Metoprolol was refilled correctly by the pharmacy.  Attempted to contact the patient. Unable to lmom patients voiced mail is full.

## 2019-03-19 NOTE — Telephone Encounter (Signed)
I called and spoke with the patient. She was not sure why the metoprolol succinate RX had been increased as she had not seen the provider in some time.  I advised her the increase in her medication came from 01/14/19 based on the results of her heart monitor.   The patient voiced understanding and was agreeable. She does currently have her metoprolol succinate 25 mg and is taking 1 & 1/2 tablets (37.5 mg) once daily.  She had no further questions.

## 2019-04-07 ENCOUNTER — Other Ambulatory Visit: Payer: Self-pay

## 2019-04-07 ENCOUNTER — Encounter: Payer: Self-pay | Admitting: Internal Medicine

## 2019-04-07 ENCOUNTER — Telehealth: Payer: Self-pay | Admitting: Internal Medicine

## 2019-04-07 ENCOUNTER — Ambulatory Visit (INDEPENDENT_AMBULATORY_CARE_PROVIDER_SITE_OTHER): Payer: Medicare Other | Admitting: Internal Medicine

## 2019-04-07 ENCOUNTER — Ambulatory Visit (INDEPENDENT_AMBULATORY_CARE_PROVIDER_SITE_OTHER): Payer: Medicare Other

## 2019-04-07 VITALS — Ht 61.0 in | Wt 127.6 lb

## 2019-04-07 DIAGNOSIS — I10 Essential (primary) hypertension: Secondary | ICD-10-CM

## 2019-04-07 DIAGNOSIS — I672 Cerebral atherosclerosis: Secondary | ICD-10-CM

## 2019-04-07 DIAGNOSIS — E782 Mixed hyperlipidemia: Secondary | ICD-10-CM

## 2019-04-07 DIAGNOSIS — Z8673 Personal history of transient ischemic attack (TIA), and cerebral infarction without residual deficits: Secondary | ICD-10-CM | POA: Diagnosis not present

## 2019-04-07 DIAGNOSIS — R002 Palpitations: Secondary | ICD-10-CM | POA: Diagnosis not present

## 2019-04-07 DIAGNOSIS — Z0000 Encounter for general adult medical examination without abnormal findings: Secondary | ICD-10-CM

## 2019-04-07 DIAGNOSIS — Z7902 Long term (current) use of antithrombotics/antiplatelets: Secondary | ICD-10-CM | POA: Diagnosis not present

## 2019-04-07 NOTE — Assessment & Plan Note (Signed)
Metoprolol dose increased by cardiology in July based on results of Zio monitor

## 2019-04-07 NOTE — Telephone Encounter (Signed)
Unable to lm to have patient call and set up a fasting lab and a nurse visit for a pneomovax #2 injection.

## 2019-04-07 NOTE — Assessment & Plan Note (Signed)
With recent TIA.  MRI/MRA reviewed.  Continue asa and plavix.  Statin intolerant.  Tolerating zetia  Started in June.

## 2019-04-07 NOTE — Assessment & Plan Note (Signed)
Well controlled on current regimen of telmisartan and metoprolol . Ome readings 130/70. Renal function assessment due,

## 2019-04-07 NOTE — Progress Notes (Addendum)
Subjective:   [name] is a 85 y.o. female who presents for Medicare Annual (Subsequent) preventive examination.  Review of Systems:  No ROS.  Medicare Wellness Virtual Visit.  Visual/audio telehealth visit, UTA vital signs.   See social history for additional risk factors.   Cardiac Risk Factors include: advanced age (>95men, >10 women);hypertension     Objective:     Vitals: There were no vitals taken for this visit.  There is no height or weight on file to calculate BMI.  Advanced Directives 04/07/2019 04/03/2018 04/02/2017 03/31/2016 03/31/2015 10/24/2014 10/31/2012  Does Patient Have a Medical Advance Directive? Yes Yes Yes Yes Yes Yes Patient has advance directive, copy not in chart  Type of Advance Directive Healthcare Power of [name];Living will [name];Living will [name];Living will [name];Living will - -  Does patient want to make changes to medical advance directive? No - Patient declined No - Patient declined No - Patient declined - No - Patient declined - -  Copy of [name] in Chart? No - copy requested No - copy requested No - copy requested No - copy requested No - copy requested No - copy requested -    Tobacco Social History   Tobacco Use  Smoking Status Never Smoker  Smokeless Tobacco Never Used     Counseling given: Not Answered   Clinical Intake:  Pre-visit preparation completed: Yes        Diabetes: No  How often do you need to have someone help you when you read instructions, pamphlets, or other written materials from your doctor or pharmacy?: 1 - Never  Interpreter Needed?: No     Past Medical History:  Diagnosis Date  . Carotid artery disease ([place])   . Coronary artery disease, non-occlusive   . Hyperlipidemia   . Hypertension   . Left bundle branch block   . Nonischemic cardiomyopathy [facility])    Past Surgical  History:  Procedure Laterality Date  . ABDOMINAL HYSTERECTOMY    . ankle     rods in both ankles  . BREAST BIOPSY Bilateral YRS AGO   NEG  . CARDIAC CATHETERIZATION     [facility]  . left ankle  Octo 2012   s/p pinning , Krazinski  . LEFT HEART CATHETERIZATION WITH CORONARY ANGIOGRAM N/A 10/31/2012   Procedure: LEFT HEART CATHETERIZATION WITH CORONARY ANGIOGRAM;  Surgeon: Clent Demark, MD;  Location: [facility] CATH LAB;  Service: Cardiovascular;  Laterality: N/A;   Family History  Problem Relation Age of Onset  . Hypertension Mother   . Heart attack Mother   . Hypertension Father   . Cancer Father        stomach  . Heart attack Brother   . Stroke Sister   . Breast cancer Neg Hx    Social History   Socioeconomic History  . Marital status: Widowed    Spouse name: Not on file  . Number of children: Not on file  . Years of education: Not on file  . Highest education level: Not on file  Occupational History  . Not on file  Social Needs  . Financial resource strain: Not hard at all  . Food insecurity    Worry: Never true    Inability: Never true  . Transportation needs    Medical: No    Non-medical: No  Tobacco Use  . Smoking status: Never Smoker  . Smokeless tobacco: Never Used  Substance and Sexual Activity  . Alcohol use: No  . Drug use: No  . Sexual activity: Not Currently  Lifestyle  . Physical activity    Days per week: 0 days    Minutes per session: Not on file  . Stress: Not at all  Relationships  . Social Herbalist on phone: Not on file    Gets together: Not on file    Attends religious service: Not on file    Active member of club or organization: Not on file    Attends meetings of clubs or organizations: Not on file    Relationship status: Not on file  Other Topics Concern  . Not on file  Social History Narrative  . Not on file    Outpatient Encounter Medications as of 04/07/2019  Medication Sig  . amLODipine (NORVASC) 5 MG tablet TAKE ONE  TABLET TWICE DAILY  . aspirin 81 MG tablet Take 81 mg by mouth daily.    . Calcium Carbonate-Vitamin D (CALCIUM + D) 600-200 MG-UNIT TABS Take by mouth.    . clopidogrel (PLAVIX) 75 MG tablet Take 1 tablet (75 mg total) by mouth daily.  . fish oil-omega-3 fatty acids 1000 MG capsule Take by mouth daily.    . metoprolol succinate (TOPROL-XL) 25 MG 24 hr tablet Take 1.5 tablets (37.5 mg total) by mouth daily.  . Multiple Vitamin (MULTIVITAMIN) tablet Take 1 tablet by mouth daily.    . Multiple Vitamins-Minerals (EYE VITAMINS PO) Take 1 tablet by mouth daily.  Marland Kitchen telmisartan (MICARDIS) 80 MG tablet TAKE ONE TABLET AT BEDTIME   No facility-administered encounter medications on file as of 04/07/2019.     Activities of Daily Living In your present state of health, do you have any difficulty performing the following activities: 04/07/2019  Hearing? N  Vision? N  Difficulty concentrating or making decisions? N  Walking or climbing stairs? N  Dressing or bathing? N  Doing errands, shopping? N  Preparing Food and eating ? N  Using the Toilet? N  In the past six months, have you accidently leaked urine? Y  Comment Managed by daily pad  Do you have problems with loss of bowel control? N  Managing your Medications? N  Managing your Finances? N  Housekeeping or managing your Housekeeping? N  Some recent data might be hidden    Patient Care Team: Crecencio Mc, MD as PCP - General (Internal Medicine)    Assessment:   This is a routine wellness examination for [name].  I connected with patient 04/07/19 at  9:00 AM EDT by an audio enabled telemedicine application and verified that I am speaking with the correct person using two identifiers. Patient stated full name and DOB. Patient gave permission to continue with virtual visit. Patient's location was at home and Nurse's location was at [place] office.   Health Maintenance Due: Dexa Scan- discontinued per patient preference; aged out.    Update all pending maintenance due as appropriate.   See completed HM at the end of note.   Eye: Visual acuity not assessed. Virtual visit. Wears corrective lenses. Followed by their ophthalmologist every 12 months.   Dental: Visits every 6 months.    Hearing: Demonstrates normal hearing during visit.  Safety:  Patient feels safe at home- yes Patient does have smoke detectors at home- yes Patient does wear sunscreen or protective clothing when in direct sunlight - yes Patient does wear seat belt when in a moving vehicle - yes  Patient drives- yes Adequate lighting in walkways free from debris- yes Grab bars and handrails used as appropriate- yes Ambulates with no assistive device Medic alert on person when ambulating outside of the home- yes  Social: Alcohol intake - no  Smoking history- never   Smokers in home? none Illicit drug use? none  Depression: PHQ 2 &9 complete. See screening below. Denies irritability, anhedonia, sadness/tearfullness.    Falls: See screening below.    Medication: Taking as directed and without issues.   Covid-19: Precautions and sickness symptoms discussed. Wears mask, social distancing, hand hygiene as appropriate.   Activities of Daily Living Patient denies needing assistance with: household chores, feeding themselves, getting from bed to chair, getting to the toilet, bathing/showering, dressing, managing money, or preparing meals.   Memory: Patient is alert. Patient denies difficulty focusing or concentrating. Correctly identified the president of the [place], season and recall 3/3. Patient likes to read for brain stimulation.  BMI- discussed the importance of a healthy diet, water intake and the benefits of aerobic exercise.  Educational material provided.  Physical activity- walking, no routine  Diet: regular Water: good intake  Other Providers Patient Care Team: Crecencio Mc, MD as PCP - General (Internal Medicine)  Exercise  Activities and Dietary recommendations Current Exercise Habits: Home exercise routine, Type of exercise: walking, Intensity: Mild  Goals      Patient Stated   . Weight (lb) < 121 lb (54.9 kg) (pt-stated)       Fall Risk Fall Risk  04/07/2019 05/07/2018 04/03/2018 04/02/2017 03/31/2016  Falls in the past year? 0 1 No No No  Comment - Emmi Telephone Survey: data to providers prior to load - - -  Number falls in past yr: 0 1 - - -  Comment - Emmi Telephone Survey Actual Response = 9 - - -  Injury with Fall? - 1 - - -   Timed Get Up and Go performed: no, virtual visit  Depression Screen PHQ 2/9 Scores 04/07/2019 04/03/2018 04/02/2017 03/31/2016  PHQ - 2 Score 0 0 0 0  PHQ- 9 Score - - 2 -     Cognitive Function MMSE - Mini Mental State Exam 03/31/2016 03/31/2015  Orientation to time 5 5  Orientation to Place 5 5  Registration 3 3  Attention/ Calculation 5 5  Recall 3 3  Language- name 2 objects 2 2  Language- repeat 1 1  Language- follow 3 step command 3 3  Language- read & follow direction 1 1  Write a sentence 1 1  Copy design 1 1  Total score 30 30     6CIT Screen 04/07/2019 04/03/2018 04/02/2017  What Year? 0 points 0 points 0 points  What month? 0 points 0 points 0 points  What time? 0 points 0 points 0 points  Count back from 20 0 points 0 points 0 points  Months in reverse 0 points 0 points 2 points  Repeat phrase - 0 points 0 points  Total Score - 0 2    Immunization History  Administered Date(s) Administered  . Influenza Split 03/20/2011, 03/13/2012, 03/09/2014  . Influenza, High Dose Seasonal PF 04/03/2018, 01/30/2019  . Influenza,inj,Quad PF,6+ Mos 03/31/2015  . Influenza-Unspecified 03/14/2013, 03/07/2014, 03/20/2016, 03/19/2017  . Pneumococcal Conjugate-13 03/23/2014  . Pneumococcal Polysaccharide-23 03/19/2010  . Tdap 04/01/2013  . Zoster 03/20/2011  . Zoster Recombinat (Shingrix) 01/26/2018, 04/19/2018   Screening Tests Health Maintenance   Topic Date Due  . TETANUS/TDAP  04/02/2023  . INFLUENZA VACCINE  Completed  . PNA vac Low Risk Adult  Completed  . DEXA SCAN  Discontinued      Plan:   Keep all routine maintenance appointments.   Follow up with your doctor today at 930.   Medicare Attestation I have personally reviewed: The patient's medical and social history Their use of alcohol, tobacco or illicit drugs Their current medications and supplements The patient's functional ability including ADLs,fall risks, home safety risks, cognitive, and hearing and visual impairment Diet and physical activities Evidence for depression    In addition, I have reviewed and discussed with patient certain preventive protocols, quality metrics, and best practice recommendations. A written personalized care plan for preventive services as well as general preventive health recommendations were provided to patient via mail.     OBrien-Blaney, Shenequa Howse L, LPN  [id number]    I have reviewed the above information and agree with above.   Deborra Medina, MD

## 2019-04-07 NOTE — Assessment & Plan Note (Signed)
Statin intolerant but tolerating zetia started in June after TIA and MRA showing cerebrovascular disease.  repeat labs are due.

## 2019-04-07 NOTE — Patient Instructions (Addendum)
  Ms. Daggy , Thank you for taking time to come for your Medicare Wellness Visit. I appreciate your ongoing commitment to your health goals. Please review the following plan we discussed and let me know if I can assist you in the future.   These are the goals we discussed: Goals      Patient Stated   . Weight (lb) < 121 lb (54.9 kg) (pt-stated)       This is a list of the screening recommended for you and due dates:  Health Maintenance  Topic Date Due  . Tetanus Vaccine  04/02/2023  . Flu Shot  Completed  . Pneumonia vaccines  Completed  . DEXA scan (bone density measurement)  Discontinued

## 2019-04-07 NOTE — Progress Notes (Signed)
Telephone  Note  This visit type was conducted due to national recommendations for restrictions regarding the COVID-19 pandemic (e.g. social distancing).  This format is felt to be most appropriate for this patient at this time.  All issues noted in this document were discussed and addressed.  No physical exam was performed (except for noted visual exam findings with Video Visits).   I connected with@ on 04/07/19 at  9:30 AM EDT by  telephone and verified that I am speaking with the correct person using two identifiers. Location patient: home Location provider: work or home office Persons participating in the virtual visit: patient, provider  I discussed the limitations, risks, security and privacy concerns of performing an evaluation and management service by telephone and the availability of in person appointments. I also discussed with the patient that there may be a patient responsible charge related to this service. The patient expressed understanding and agreed to proceed.  Reason for visit: 3 month follow up   HPI:  85 yr old female with  History of TIA in May 2020 found to have diffuse cerebravascular atherosclerotic disease .  Started on zetia in June and tolerating mediation,  histor of statin intolerance    metoprolol dose increased yo 37.5 mg in July based on Zio monitor results   Tolerating meds. Well  The patient has no signs or symptoms of COVID 19 infection (fever, cough, sore throat  or shortness of breath beyond what is typical for patient).  Patient denies contact with other persons with the above mentioned symptoms or with anyone confirmed to have COVID 19 Not depressed or anxious due to COVID 19.  Not gong to church but in touch weekly with minister and watching televised church services.   Hypertension: patient checks blood pressure twice weekly at home.  Readings have been for < 140/80 at rest . Patient is following a reduced salt diet most days and is taking medications  as prescribed  Weight relatively stable,  Down 2 lbs since Oct 2019  ROS: See pertinent positives and negatives per HPI.  Past Medical History:  Diagnosis Date  . Carotid artery disease ([place])   . Coronary artery disease, non-occlusive   . Hyperlipidemia   . Hypertension   . Left bundle branch block   . Nonischemic cardiomyopathy [facility])     Past Surgical History:  Procedure Laterality Date  . ABDOMINAL HYSTERECTOMY    . ankle     rods in both ankles  . BREAST BIOPSY Bilateral YRS AGO   NEG  . CARDIAC CATHETERIZATION     [facility]  . left ankle  Octo 2012   s/p pinning , Krazinski  . LEFT HEART CATHETERIZATION WITH CORONARY ANGIOGRAM N/A 10/31/2012   Procedure: LEFT HEART CATHETERIZATION WITH CORONARY ANGIOGRAM;  Surgeon: Clent Demark, MD;  Location: [facility] CATH LAB;  Service: Cardiovascular;  Laterality: N/A;    Family History  Problem Relation Age of Onset  . Hypertension Mother   . Heart attack Mother   . Hypertension Father   . Cancer Father        stomach  . Heart attack Brother   . Stroke Sister   . Breast cancer Neg Hx     SOCIAL HX:  reports that she has never smoked. She has never used smokeless tobacco. She reports that she does not drink alcohol or use drugs.   Current Outpatient Medications:  .  amLODipine (NORVASC) 5 MG tablet, TAKE ONE TABLET TWICE DAILY, Disp: 180 tablet, Rfl:  1 .  aspirin 81 MG tablet, Take 81 mg by mouth daily.  , Disp: , Rfl:  .  Calcium Carbonate-Vitamin D (CALCIUM + D) 600-200 MG-UNIT TABS, Take by mouth.  , Disp: , Rfl:  .  clopidogrel (PLAVIX) 75 MG tablet, Take 1 tablet (75 mg total) by mouth daily., Disp: 90 tablet, Rfl: 3 .  fish oil-omega-3 fatty acids 1000 MG capsule, Take by mouth daily.  , Disp: , Rfl:  .  metoprolol succinate (TOPROL-XL) 25 MG 24 hr tablet, Take 1.5 tablets (37.5 mg total) by mouth daily., Disp: 135 tablet, Rfl: 1 .  Multiple Vitamin (MULTIVITAMIN) tablet, Take 1 tablet by mouth daily.  , Disp: , Rfl:  .   Multiple Vitamins-Minerals (EYE VITAMINS PO), Take 1 tablet by mouth daily., Disp: , Rfl:  .  telmisartan (MICARDIS) 80 MG tablet, TAKE ONE TABLET AT BEDTIME, Disp: 30 tablet, Rfl: 2  EXAM:   General impression: alert, cooperative and articulate.  No signs of being in distress  Lungs: speech is fluent sentence length suggests that patient is not short of breath and not punctuated by cough, sneezing or sniffing. Marland Kitchen   Psych: affect normal.  speech is articulate and non pressured .  Denies suicidal thoughts       ASSESSMENT AND PLAN:  Discussed the following assessment and plan:  Mixed hyperlipidemia - Plan: Lipid panel  Arteriosclerotic cerebrovascular disease  Essential hypertension - Plan: Comprehensive metabolic panel  Encounter for current long term use of antiplatelet drug - Plan: CBC with Differential/Platelet  Palpitations  Arteriosclerotic cerebrovascular disease With recent TIA.  MRI/MRA reviewed.  Continue asa and plavix.  Statin intolerant.  Tolerating zetia  Started in June.    Hypertension Well controlled on current regimen of telmisartan and metoprolol . Ome readings 130/70. Renal function assessment due,    Palpitations Metoprolol dose increased by cardiology in July based on results of Zio monitor  Mixed hyperlipidemia Statin intolerant but tolerating zetia started in June after TIA and MRA showing cerebrovascular disease.  repeat labs are due.     I discussed the assessment and treatment plan with the patient. The patient was provided an opportunity to ask questions and all were answered. The patient agreed with the plan and demonstrated an understanding of the instructions.   The patient was advised to call back or seek an in-person evaluation if the symptoms worsen or if the condition fails to improve as anticipated.  I provided  25 minutes of non-face-to-face time during this encounter reviewing patient's current problems and post surgeries.  Providing  counseling on the above mentioned problems , and coordination  of care .  Crecencio Mc, MD

## 2019-04-14 ENCOUNTER — Other Ambulatory Visit: Payer: Self-pay | Admitting: Internal Medicine

## 2019-04-15 ENCOUNTER — Other Ambulatory Visit: Payer: Self-pay

## 2019-04-15 ENCOUNTER — Ambulatory Visit (INDEPENDENT_AMBULATORY_CARE_PROVIDER_SITE_OTHER): Payer: Medicare Other | Admitting: *Deleted

## 2019-04-15 ENCOUNTER — Other Ambulatory Visit (INDEPENDENT_AMBULATORY_CARE_PROVIDER_SITE_OTHER): Payer: Medicare Other

## 2019-04-15 DIAGNOSIS — Z23 Encounter for immunization: Secondary | ICD-10-CM | POA: Diagnosis not present

## 2019-04-15 DIAGNOSIS — E782 Mixed hyperlipidemia: Secondary | ICD-10-CM

## 2019-04-15 DIAGNOSIS — Z7902 Long term (current) use of antithrombotics/antiplatelets: Secondary | ICD-10-CM | POA: Diagnosis not present

## 2019-04-15 DIAGNOSIS — I10 Essential (primary) hypertension: Secondary | ICD-10-CM

## 2019-04-15 LAB — LIPID PANEL
Cholesterol: 280 mg/dL — ABNORMAL HIGH (ref 0–200)
HDL: 51.3 mg/dL (ref >39.00)
LDL Cholesterol: 197 mg/dL — ABNORMAL HIGH (ref 0–99)
NonHDL: 228.65
Total CHOL/HDL Ratio: 5
Triglycerides: 157 mg/dL — ABNORMAL HIGH (ref 0.0–149.0)
VLDL: 31.4 mg/dL (ref 0.0–40.0)

## 2019-04-15 LAB — CBC WITH DIFFERENTIAL/PLATELET
Basophils Absolute: 0 10*3/uL (ref 0.0–0.1)
Basophils Relative: 0.5 % (ref 0.0–3.0)
Eosinophils Absolute: 0.2 10*3/uL (ref 0.0–0.7)
Eosinophils Relative: 2.9 % (ref 0.0–5.0)
HCT: 44.9 % (ref 36.0–46.0)
Hemoglobin: 14.9 g/dL (ref 12.0–15.0)
Lymphocytes Relative: 24.4 % (ref 12.0–46.0)
Lymphs Abs: 1.5 10*3/uL (ref 0.7–4.0)
MCHC: 33.2 g/dL (ref 30.0–36.0)
MCV: 95.4 fl (ref 78.0–100.0)
Monocytes Absolute: 0.6 10*3/uL (ref 0.1–1.0)
Monocytes Relative: 10.1 % (ref 3.0–12.0)
Neutro Abs: 3.9 10*3/uL (ref 1.4–7.7)
Neutrophils Relative %: 62.1 % (ref 43.0–77.0)
Platelets: 222 10*3/uL (ref 150.0–400.0)
RBC: 4.71 Mil/uL (ref 3.87–5.11)
RDW: 14.2 % (ref 11.5–15.5)
WBC: 6.3 10*3/uL (ref 4.0–10.5)

## 2019-04-15 LAB — COMPREHENSIVE METABOLIC PANEL
ALT: 12 U/L (ref 0–35)
AST: 18 U/L (ref 0–37)
Albumin: 4.1 g/dL (ref 3.5–5.2)
Alkaline Phosphatase: 70 U/L (ref 39–117)
BUN: 21 mg/dL (ref 6–23)
CO2: 30 mEq/L (ref 19–32)
Calcium: 10.1 mg/dL (ref 8.4–10.5)
Chloride: 103 mEq/L (ref 96–112)
Creatinine, Ser: 1.04 mg/dL (ref 0.40–1.20)
GFR: 49.3 mL/min — ABNORMAL LOW (ref >60.00)
Glucose, Bld: 96 mg/dL (ref 70–99)
Potassium: 4.2 mEq/L (ref 3.5–5.1)
Sodium: 139 mEq/L (ref 135–145)
Total Bilirubin: 0.8 mg/dL (ref 0.2–1.2)
Total Protein: 7.2 g/dL (ref 6.0–8.3)

## 2019-05-07 ENCOUNTER — Telehealth: Payer: Self-pay | Admitting: Internal Medicine

## 2019-05-07 NOTE — Telephone Encounter (Signed)
Pt given results per Dr Derrel Nip, "You labs are unchanged. your kidney function is slightly low but stable and has not changed over the past 2 years. Continue to avoid using motrin and aleve on a regular basis as theses can harm kidney function with daily use. Tylenol is safer to use for joint pain and headaches and does not affect kidney function .  You can take up to 2000 mg of acetominophen (tylenol) every day safely In divided doses (500 mg every 6 hours Or 1000 mg every 12 hours.)"; she verbalized understanding.

## 2019-06-09 ENCOUNTER — Other Ambulatory Visit: Payer: Self-pay | Admitting: Internal Medicine

## 2019-07-05 ENCOUNTER — Ambulatory Visit: Payer: Medicare Other | Attending: Internal Medicine

## 2019-07-05 DIAGNOSIS — Z23 Encounter for immunization: Secondary | ICD-10-CM | POA: Diagnosis not present

## 2019-07-05 NOTE — Progress Notes (Signed)
   Covid-19 Vaccination Clinic  Name:  LAQUISHA MASTRANDREA    MRN: [id number] DOB: 1923/03/20  07/05/2019  Ms. Mcwhite was observed post Covid-19 immunization for 15 minutes without incidence. She was provided with Vaccine Information Sheet and instruction to access the V-Safe system.   Ms. Hemmer was instructed to call 911 with any severe reactions post vaccine: Marland Kitchen Difficulty breathing  . Swelling of your face and throat  . A fast heartbeat  . A bad rash all over your body  . Dizziness and weakness    Immunizations Administered    Name Date Dose VIS Date Route   Pfizer COVID-19 Vaccine 07/05/2019 12:37 PM 0.3 mL 05/30/2019 Intramuscular   Manufacturer: [place]   Lot: [id number]   [place]: [id number]

## 2019-07-26 ENCOUNTER — Ambulatory Visit: Payer: Medicare Other | Attending: Internal Medicine

## 2019-07-26 DIAGNOSIS — Z23 Encounter for immunization: Secondary | ICD-10-CM

## 2019-07-26 NOTE — Progress Notes (Signed)
   Covid-19 Vaccination Clinic  Name:  [name]    MRN: [id number] DOB: [birth year]  07/26/2019  [name] was observed post Covid-19 immunization for 30 minutes based on pre-vaccination screening without incidence. She was provided with Vaccine Information Sheet and instruction to access the V-Safe system.   [name] was instructed to call 911 with any severe reactions post vaccine: Marland Kitchen Difficulty breathing  . Swelling of your face and throat  . A fast heartbeat  . A bad rash all over your body  . Dizziness and weakness    Immunizations Administered    Name Date Dose VIS Date Route   Pfizer COVID-19 Vaccine 07/26/2019 12:27 PM 0.3 mL 05/30/2019 Intramuscular   Manufacturer: [place]   Lot: [id number]   [place]: [id number]

## 2019-09-03 ENCOUNTER — Other Ambulatory Visit: Payer: Self-pay | Admitting: Physician Assistant

## 2019-09-10 DIAGNOSIS — H2513 Age-related nuclear cataract, bilateral: Secondary | ICD-10-CM | POA: Diagnosis not present

## 2019-09-15 ENCOUNTER — Other Ambulatory Visit: Payer: Self-pay | Admitting: Internal Medicine

## 2019-09-15 ENCOUNTER — Telehealth: Payer: Self-pay | Admitting: Internal Medicine

## 2019-09-15 DIAGNOSIS — R300 Dysuria: Secondary | ICD-10-CM

## 2019-09-15 DIAGNOSIS — A499 Bacterial infection, unspecified: Secondary | ICD-10-CM | POA: Diagnosis not present

## 2019-09-15 DIAGNOSIS — N390 Urinary tract infection, site not specified: Secondary | ICD-10-CM | POA: Diagnosis not present

## 2019-09-15 NOTE — Telephone Encounter (Signed)
Pt states that she has a UTI. Symptoms started Saturday night. Burning pain when urinating. Pt took AZO yesterday.

## 2019-09-15 NOTE — Telephone Encounter (Signed)
Left message to return call 

## 2019-09-15 NOTE — Telephone Encounter (Signed)
Please advise 

## 2019-09-15 NOTE — Telephone Encounter (Signed)
Pt called checking for an appt or come do a urine  Pt would like a phone call back

## 2019-09-15 NOTE — Telephone Encounter (Signed)
Have her drop off a urine this afternoon and  set her up for a telephone or virtual on  Tuesday at 12:45

## 2019-09-15 NOTE — Addendum Note (Signed)
Addended by: Leeanne Rio on: 09/15/2019 12:51 PM   Modules accepted: Orders

## 2019-09-16 NOTE — Telephone Encounter (Signed)
Spoke with pt and she stated that she did not get the message to call back until yesterady around 5pm and she stated that she really needed to see somebody that day. She stated that she went to [facility].

## 2019-09-17 ENCOUNTER — Other Ambulatory Visit: Payer: Self-pay | Admitting: Internal Medicine

## 2019-10-22 ENCOUNTER — Other Ambulatory Visit: Payer: Self-pay | Admitting: Internal Medicine

## 2019-10-27 DIAGNOSIS — H93293 Other abnormal auditory perceptions, bilateral: Secondary | ICD-10-CM | POA: Diagnosis not present

## 2019-10-27 DIAGNOSIS — H6123 Impacted cerumen, bilateral: Secondary | ICD-10-CM | POA: Diagnosis not present

## 2019-11-19 ENCOUNTER — Other Ambulatory Visit: Payer: Self-pay | Admitting: Internal Medicine

## 2019-12-03 ENCOUNTER — Other Ambulatory Visit: Payer: Self-pay

## 2019-12-03 MED ORDER — METOPROLOL SUCCINATE ER 25 MG PO TB24: ORAL_TABLET | ORAL | 0 refills | Status: DC

## 2019-12-23 ENCOUNTER — Other Ambulatory Visit: Payer: Self-pay

## 2019-12-23 ENCOUNTER — Encounter: Payer: Self-pay | Admitting: Cardiovascular Disease

## 2019-12-23 ENCOUNTER — Ambulatory Visit (INDEPENDENT_AMBULATORY_CARE_PROVIDER_SITE_OTHER): Payer: Medicare Other | Admitting: Cardiovascular Disease

## 2019-12-23 VITALS — BP 140/80 | HR 68 | Ht 61.0 in | Wt 132.2 lb

## 2019-12-23 DIAGNOSIS — I10 Essential (primary) hypertension: Secondary | ICD-10-CM | POA: Diagnosis not present

## 2019-12-23 DIAGNOSIS — I5022 Chronic systolic (congestive) heart failure: Secondary | ICD-10-CM | POA: Diagnosis not present

## 2019-12-23 DIAGNOSIS — E785 Hyperlipidemia, unspecified: Secondary | ICD-10-CM | POA: Diagnosis not present

## 2019-12-23 NOTE — Progress Notes (Signed)
Cardiology Office Note   Date:  12/23/2019   ID:  [name], DOB 1922/07/25, MRN [id number]  PCP:  Crecencio Mc, MD  Cardiologist:   Kathlyn Sacramento, MD   Chief Complaint  Patient presents with   other    12 month follow up. Meds reviewed by the pt. verbally.       History of Present Illness: [name] is a 86 y.o. female who presents for a follow up visit regarding nonischemic cardiomyopathy. She had cardiac catheterization in May 2014 which showed mild nonobstructive coronary artery disease involving the LAD and left circumflex. Ejection fraction was normal.  She does have left bundle branch block. Echocardiogram in November 2015 showed an ejection fraction of 45-50%, mild-to-moderate aortic regurgitation, mild mitral regurgitation and mild-to-moderate tricuspid regurgitation. Pulmonary pressure was normal.  She had TIA symptoms in June 2020.  Plavix was added to aspirin.  Carotid Doppler showed mild nonobstructive carotid disease.  MRI of the brain showed no evidence of acute or subacute infarct.  She was noted to have widespread intracranial atherosclerotic disease with possible flow-limiting stenosis in the proximal right MSA and proximal left PCA.  She had outpatient monitor which showed no evidence of atrial fibrillation although she was found to have frequent short episodes of SVT.  The dose of Toprol was increased to 37.5 mg. She has been doing very well with no recent pain, shortness of breath or palpitations.  She continues to be independent.  She lives by herself at [facility].  Past Medical History:  Diagnosis Date   Carotid artery disease ([place])    Coronary artery disease, non-occlusive    Hyperlipidemia    Hypertension    Left bundle branch block    Nonischemic cardiomyopathy ([place])     Past Surgical History:  Procedure Laterality Date   ABDOMINAL HYSTERECTOMY     ankle     rods in both ankles   BREAST BIOPSY Bilateral YRS AGO    NEG   CARDIAC CATHETERIZATION     [facility]   left ankle  Octo 2012   s/p pinning , Krazinski   LEFT HEART CATHETERIZATION WITH CORONARY ANGIOGRAM N/A 10/31/2012   Procedure: LEFT HEART CATHETERIZATION WITH CORONARY ANGIOGRAM;  Surgeon: Clent Demark, MD;  Location: [facility] CATH LAB;  Service: Cardiovascular;  Laterality: N/A;     Current Outpatient Medications  Medication Sig Dispense Refill   amLODipine (NORVASC) 5 MG tablet TAKE ONE TABLET BY MOUTH TWICE DAILY 180 tablet 1   aspirin 81 MG tablet Take 81 mg by mouth daily.       Calcium Carbonate-Vitamin D (CALCIUM + D) 600-200 MG-UNIT TABS Take by mouth.       clopidogrel (PLAVIX) 75 MG tablet TAKE 1 TABLET BY MOUTH DAILY 90 tablet 3   fish oil-omega-3 fatty acids 1000 MG capsule Take by mouth daily.       metoprolol succinate (TOPROL-XL) 25 MG 24 hr tablet TAKE ONE AND ONE-HALF TABLET BY MOUTH EVERY DAY 135 tablet 0   Multiple Vitamin (MULTIVITAMIN) tablet Take 1 tablet by mouth daily.       Multiple Vitamins-Minerals (EYE VITAMINS PO) Take 1 tablet by mouth daily.     telmisartan (MICARDIS) 80 MG tablet TAKE 1 TABLET BY MOUTH NIGHTLY 90 tablet 1   No current facility-administered medications for this visit.    Allergies:   Sulfa antibiotics and Contrast media [iodinated diagnostic agents]    Social History:  The patient  reports  that she has never smoked. She has never used smokeless tobacco. She reports that she does not drink alcohol and does not use drugs.   Family History:  The patient's family history includes Cancer in her father; Heart attack in her brother and mother; Hypertension in her father and mother; Stroke in her sister.    ROS:  Please see the history of present illness.   Otherwise, review of systems are positive for none.   All other systems are reviewed and negative.    PHYSICAL EXAM: VS:  BP 140/80 (BP Location: Left Arm, Patient Position: Sitting, Cuff Size: Normal)    Pulse 68    Ht 5\' 1"  (1.549 m)     Wt 132 lb 4 oz (60 kg)    SpO2 98%    BMI 24.99 kg/m  , BMI Body mass index is 24.99 kg/m. GEN: Well nourished, well developed, in no acute distress  HEENT: normal  Neck: no JVD, carotid bruits, or masses Cardiac: RRR; no rubs, or gallops,no edema . 1/ 6 systolic murmur in the aortic area Respiratory:  clear to auscultation bilaterally, normal work of breathing GI: soft, nontender, nondistended, + BS MS: no deformity or atrophy  Skin: warm and dry, no rash Neuro:  Strength and sensation are intact Psych: euthymic mood, full affect   EKG:  EKG is ordered today. The ekg ordered today demonstrates normal sinus rhythm with left bundle branch block.   Recent Labs: 04/15/2019: ALT 12; BUN 21; Creatinine, Ser 1.04; Hemoglobin 14.9; Platelets 222.0; Potassium 4.2; Sodium 139    Lipid Panel    Component Value Date/Time   CHOL 280 (H) 04/15/2019 0935   CHOL 226 (H) 09/16/2012 0542   TRIG 157.0 (H) 04/15/2019 0935   TRIG 109 09/16/2012 0542   HDL 51.30 04/15/2019 0935   HDL 60 09/16/2012 0542   CHOLHDL 5 04/15/2019 0935   VLDL 31.4 04/15/2019 0935   VLDL 22 09/16/2012 0542   LDLCALC 197 (H) 04/15/2019 0935   LDLCALC 144 (H) 09/16/2012 0542   LDLDIRECT 159.0 04/07/2015 0957      Wt Readings from Last 3 Encounters:  12/23/19 132 lb 4 oz (60 kg)  04/07/19 127 lb 9.6 oz (57.9 kg)  12/06/18 129 lb 4 oz (58.6 kg)        ASSESSMENT AND PLAN:  1.  Chronic systolic heart failure: Due to nonischemic cardiomyopathy. Ejection fraction was 45-50%. Currently New York Heart Association class II. No evidence of volume overload. Continue  Current medications.  She is on Toprol and telmisartan.  2. Essential hypertension: Blood pressure is well control on current medications.  3.  Hyperlipidemia: Intolerance to statins and Zetia.  4.  Previous TIA: Currently on aspirin and Plavix.  We will stop aspirin to minimize the risk of bleeding.   Disposition:   FU with me in 12  months  Signed,  Kathlyn Sacramento, MD  12/23/2019 3:12 PM    [place]

## 2019-12-23 NOTE — Patient Instructions (Addendum)
Medication Instructions:  Your physician has recommended you make the following change in your medication:   STOP Aspirin   *If you need a refill on your cardiac medications before your next appointment, please call your pharmacy*   Lab Work: None If you have labs (blood work) drawn today and your tests are completely normal, you will receive your results only by: Marland Kitchen MyChart Message (if you have MyChart) OR . A paper copy in the mail If you have any lab test that is abnormal or we need to change your treatment, we will call you to review the results.   Testing/Procedures: None   Follow-Up: At [facility], you and your health needs are our priority.  As part of our continuing mission to provide you with exceptional heart care, we have created designated Provider Care Teams.  These Care Teams include your primary Cardiologist (physician) and Advanced Practice Providers (APPs -  Physician Assistants and Nurse Practitioners) who all work together to provide you with the care you need, when you need it.  We recommend signing up for the patient portal called "MyChart".  Sign up information is provided on this After Visit Summary.  MyChart is used to connect with patients for Virtual Visits (Telemedicine).  Patients are able to view lab/test results, encounter notes, upcoming appointments, etc.  Non-urgent messages can be sent to your provider as well.   To learn more about what you can do with MyChart, go to [email or web address].    Your next appointment:   12 month(s)  The format for your next appointment:   In Person  Provider:    You may see Raynelle Chary, MD or one of the following Advanced Practice Providers on your designated Care Team:    Murray Hodgkins, NP  Christell Faith, PA-C  Marrianne Mood, PA-C    Other Instructions None

## 2020-01-23 ENCOUNTER — Other Ambulatory Visit: Payer: Self-pay | Admitting: Internal Medicine

## 2020-02-02 ENCOUNTER — Telehealth: Payer: Self-pay | Admitting: Internal Medicine

## 2020-02-02 NOTE — Telephone Encounter (Signed)
Spoke with pt and scheduled her for a telephone visit to discuss the medication that she has been using for the vaginal itching. Pt stated that she has to wear a pad all the time because she leaks and she believes that is why she is itching. Pt stated that she talked to the pharmacist and they told her to try miconazole. Pt has used a seven day course of it and feels a little bit better but wants to know if she can continue to use this medication.

## 2020-02-02 NOTE — Telephone Encounter (Signed)
I cannot advise without doing a pelvic exam . Please schedule her an apt

## 2020-02-02 NOTE — Telephone Encounter (Signed)
Pt wants advice on OTC medication for vaginal itching. Please advise

## 2020-02-03 NOTE — Telephone Encounter (Signed)
Spoke with pt to let her know that her appt will need to be in office on 02/05/2020. Pt gave a verbal understanding.

## 2020-02-05 ENCOUNTER — Other Ambulatory Visit: Payer: Self-pay

## 2020-02-05 ENCOUNTER — Ambulatory Visit (INDEPENDENT_AMBULATORY_CARE_PROVIDER_SITE_OTHER): Payer: Medicare Other | Admitting: Internal Medicine

## 2020-02-05 ENCOUNTER — Encounter: Payer: Self-pay | Admitting: Internal Medicine

## 2020-02-05 VITALS — BP 150/80 | HR 74 | Temp 97.6°F | Resp 15 | Ht 61.0 in | Wt 131.4 lb

## 2020-02-05 DIAGNOSIS — E785 Hyperlipidemia, unspecified: Secondary | ICD-10-CM | POA: Diagnosis not present

## 2020-02-05 DIAGNOSIS — R944 Abnormal results of kidney function studies: Secondary | ICD-10-CM

## 2020-02-05 DIAGNOSIS — I10 Essential (primary) hypertension: Secondary | ICD-10-CM | POA: Diagnosis not present

## 2020-02-05 DIAGNOSIS — N760 Acute vaginitis: Secondary | ICD-10-CM | POA: Diagnosis not present

## 2020-02-05 DIAGNOSIS — Z789 Other specified health status: Secondary | ICD-10-CM

## 2020-02-05 DIAGNOSIS — E1169 Type 2 diabetes mellitus with other specified complication: Secondary | ICD-10-CM

## 2020-02-05 DIAGNOSIS — E559 Vitamin D deficiency, unspecified: Secondary | ICD-10-CM

## 2020-02-05 DIAGNOSIS — E782 Mixed hyperlipidemia: Secondary | ICD-10-CM | POA: Diagnosis not present

## 2020-02-05 LAB — MICROALBUMIN / CREATININE URINE RATIO
Creatinine,U: 84 mg/dL
Microalb Creat Ratio: 1.2 mg/g (ref 0.0–30.0)
Microalb, Ur: 1 mg/dL (ref 0.0–1.9)

## 2020-02-05 LAB — COMPREHENSIVE METABOLIC PANEL
ALT: 11 U/L (ref 0–35)
AST: 18 U/L (ref 0–37)
Albumin: 4 g/dL (ref 3.5–5.2)
Alkaline Phosphatase: 73 U/L (ref 39–117)
BUN: 23 mg/dL (ref 6–23)
CO2: 31 mEq/L (ref 19–32)
Calcium: 9.9 mg/dL (ref 8.4–10.5)
Chloride: 101 mEq/L (ref 96–112)
Creatinine, Ser: 1.05 mg/dL (ref 0.40–1.20)
GFR: 48.67 mL/min — ABNORMAL LOW (ref >60.00)
Glucose, Bld: 125 mg/dL — ABNORMAL HIGH (ref 70–99)
Potassium: 4 mEq/L (ref 3.5–5.1)
Sodium: 140 mEq/L (ref 135–145)
Total Bilirubin: 0.6 mg/dL (ref 0.2–1.2)
Total Protein: 6.8 g/dL (ref 6.0–8.3)

## 2020-02-05 LAB — TSH: TSH: 3.66 u[IU]/mL (ref 0.35–4.50)

## 2020-02-05 LAB — LIPID PANEL
Cholesterol: 273 mg/dL — ABNORMAL HIGH (ref 0–200)
HDL: 49.1 mg/dL (ref >39.00)
NonHDL: 223.45
Total CHOL/HDL Ratio: 6
Triglycerides: 205 mg/dL — ABNORMAL HIGH (ref 0.0–149.0)
VLDL: 41 mg/dL — ABNORMAL HIGH (ref 0.0–40.0)

## 2020-02-05 LAB — LDL CHOLESTEROL, DIRECT: Direct LDL: 173 mg/dL

## 2020-02-05 LAB — VITAMIN D 25 HYDROXY (VIT D DEFICIENCY, FRACTURES): VITD: 46.24 ng/mL (ref 30.00–100.00)

## 2020-02-05 MED ORDER — PRAVASTATIN SODIUM 20 MG PO TABS: 20.0000 mg | ORAL_TABLET | Freq: Every day | ORAL | 3 refills | Status: DC

## 2020-02-05 NOTE — Assessment & Plan Note (Signed)
No obvious signs of infection.  Likely due to irritation from overwashing and/or contact with urine . Encouraged to use unscented baby wipes for  hygiene between daily showers,  Derma cloud for protection of skin from pH of urine

## 2020-02-05 NOTE — Assessment & Plan Note (Signed)
she reports compliance with medication regimen  but has an elevated reading today in office.  She is not using NSAIDs daily.  Discussed goal of  (130/80 for patients over 70)  to preserve renal function.  She has been asked to check her  BP  at home and  submit readings for evaluation. Renal function, electrolytes and screen for proteinuria are all normal.  

## 2020-02-05 NOTE — Assessment & Plan Note (Signed)
Statin intolerant, zetia intolerant but willing to retry statin ,, pravsatatin qod.  Baseline lipids today

## 2020-02-05 NOTE — Progress Notes (Signed)
Subjective:  Patient ID: [name], female    DOB: 08/20/1922  Age: 86 y.o. MRN: [id number]  CC: The primary encounter diagnosis was Hyperlipidemia associated with type 2 diabetes mellitus ([place]). Diagnoses of Dyslipidemia, Vitamin D deficiency, Essential hypertension, Vaginitis and vulvovaginitis, Statin intolerance, and Mixed hyperlipidemia were also pertinent to this visit.  HPI [name] presents for evaluation and treatment of vaginal itching.   This visit occurred during the SARS-CoV-2 public health emergency.  Safety protocols were in place, including screening questions prior to the visit, additional usage of staff PPE, and extensive cleaning of exam room while observing appropriate contact time as indicated for disinfecting solutions.   Vaginal itching  Symptoms started two weeks ago,  Used oc miconazole for  Days and itching has resolved.  No discharge, not sexually active,  Wears an unscented panty liner due to stress incontinence and washes herself several times daily with hot water and washcloth , whenever the pad is soiled .  uses [place] soap in the shower /bath.  Itching is aggravated by hot water.    2) Hyperlipidemia:  Wants to try taking a statin.  Drank  Equate [employer]  This mornign     3) HTN:  Has not been checking BP at home since husband died.  Taking meds   4) CVD: had follow up  With Dr Fletcher Anon recently.  Asa stopped,  plavix continued  Outpatient Medications Prior to Visit  Medication Sig Dispense Refill  . amLODipine (NORVASC) 5 MG tablet TAKE ONE TABLET BY MOUTH TWICE DAILY 180 tablet 1  . Calcium Carbonate-Vitamin D (CALCIUM + D) 600-200 MG-UNIT TABS Take by mouth.      . clopidogrel (PLAVIX) 75 MG tablet TAKE 1 TABLET BY MOUTH DAILY 90 tablet 3  . fish oil-omega-3 fatty acids 1000 MG capsule Take by mouth daily.      . metoprolol succinate (TOPROL-XL) 25 MG 24 hr tablet TAKE ONE AND ONE-HALF TABLET BY MOUTH EVERY DAY 135 tablet 0  . Multiple Vitamin  (MULTIVITAMIN) tablet Take 1 tablet by mouth daily.      . Multiple Vitamins-Minerals (EYE VITAMINS PO) Take 1 tablet by mouth daily.    Marland Kitchen telmisartan (MICARDIS) 80 MG tablet TAKE 1 TABLET BY MOUTH NIGHTLY 90 tablet 1   No facility-administered medications prior to visit.    Review of Systems;  Patient denies headache, fevers, malaise, unintentional weight loss, skin rash, eye pain, sinus congestion and sinus pain, sore throat, dysphagia,  hemoptysis , cough, dyspnea, wheezing, chest pain, palpitations, orthopnea, edema, abdominal pain, nausea, melena, diarrhea, constipation, flank pain, dysuria, hematuria, urinary  Frequency, nocturia, numbness, tingling, seizures,  Focal weakness, Loss of consciousness,  Tremor, insomnia, depression, anxiety, and suicidal ideation.      Objective:  BP (!) 150/80 (BP Location: Left Arm, Patient Position: Sitting, Cuff Size: Normal)   Pulse 74   Temp 97.6 F (36.4 C) (Oral)   Resp 15   Ht 5\' 1"  (1.549 m)   Wt 131 lb 6.4 oz (59.6 kg)   SpO2 95%   BMI 24.83 kg/m   BP Readings from Last 3 Encounters:  02/05/20 (!) 150/80  12/23/19 140/80  12/06/18 130/64    Wt Readings from Last 3 Encounters:  02/05/20 131 lb 6.4 oz (59.6 kg)  12/23/19 132 lb 4 oz (60 kg)  04/07/19 127 lb 9.6 oz (57.9 kg)    General appearance: alert, cooperative and appears stated age Ears: normal TM's and external ear canals  both ears Throat: lips, mucosa, and tongue normal; teeth and gums normal Neck: no adenopathy, no carotid bruit, supple, symmetrical, trachea midline and thyroid not enlarged, symmetric, no tenderness/mass/nodules Back: kyphotic .   ROM normal. No CVA tenderness. Lungs: clear to auscultation bilaterally Heart: regular rate and rhythm, S1, S2 normal, no murmur, click, rub or gallop Abdomen: soft, non-tender; bowel sounds normal; no masses,  no organomegaly Pulses: 2+ and symmetric GYN:  External exam done ,  Labia majora erythematous without ulcerations  or lesion  Skin: Skin color, texture, turgor normal. No rashes or lesions Lymph nodes: Cervical, supraclavicular, and axillary nodes normal.  No results found for: HGBA1C  Lab Results  Component Value Date   CREATININE 1.04 04/15/2019   CREATININE 0.98 12/06/2018   CREATININE 0.93 12/03/2017    Lab Results  Component Value Date   WBC 6.3 04/15/2019   HGB 14.9 04/15/2019   HCT 44.9 04/15/2019   PLT 222.0 04/15/2019   GLUCOSE 96 04/15/2019   CHOL 280 (H) 04/15/2019   TRIG 157.0 (H) 04/15/2019   HDL 51.30 04/15/2019   LDLDIRECT 159.0 04/07/2015   LDLCALC 197 (H) 04/15/2019   ALT 12 04/15/2019   AST 18 04/15/2019   NA 139 04/15/2019   K 4.2 04/15/2019   CL 103 04/15/2019   CREATININE 1.04 04/15/2019   BUN 21 04/15/2019   CO2 30 04/15/2019   TSH 2.70 12/03/2017    MR Brain Wo Contrast  Result Date: 12/02/2018 CLINICAL DATA:  Episode of dysarthria on 11/24/2018. EXAM: MRI HEAD WITHOUT CONTRAST TECHNIQUE: Multiplanar, multiecho pulse sequences of the brain and surrounding structures were obtained without intravenous contrast. COMPARISON:  None. FINDINGS: Brain: There is no evidence of acute infarct, intracranial hemorrhage, mass, midline shift, or extra-axial fluid collection. Cerebral white matter T2 hyperintensities are nonspecific but compatible with mild chronic small vessel ischemic disease. Cerebral atrophy is at most mildly advanced for age. Vascular: Major intracranial vascular flow voids are preserved. Skull and upper cervical spine: Unremarkable bone marrow signal. Sinuses/Orbits: Unremarkable orbits. Paranasal sinuses and mastoid air cells are clear. Other: None. IMPRESSION: 1. No evidence of acute or subacute infarct. 2. Mild chronic small vessel ischemic disease. Electronically Signed   By: Logan Bores M.D.   On: 12/02/2018 08:44    Assessment & Plan:   Problem List Items Addressed This Visit      Unprioritized   Hypertension    she reports compliance with  medication regimen  but has an elevated reading today in office.  She is not using NSAIDs daily.  Discussed goal of 130/80 for patients over 70  to preserve renal function.  She has been asked to check her  BP  at home and  submit readings for evaluation. Renal function, electrolytes and screen for proteinuria are all normal .      Relevant Medications   pravastatin (PRAVACHOL) 20 MG tablet   Other Relevant Orders   Microalbumin / creatinine urine ratio   Mixed hyperlipidemia    Statin intolerant, zetia intolerant but willing to retry statin ,, pravsatatin qod.  Baseline lipids today       Relevant Medications   pravastatin (PRAVACHOL) 20 MG tablet   Statin intolerance    She is willing to retry a statin.  Will start with QOD pravastatin       Vaginitis and vulvovaginitis    No obvious signs of infection.  Likely due to irritation from overwashing and/or contact with urine . Encouraged to use unscented baby wipes  for  hygiene between daily showers,  Derma cloud for protection of skin from pH of urine        Other Visit Diagnoses    Hyperlipidemia associated with type 2 diabetes mellitus ([place])    -  Primary   Relevant Medications   pravastatin (PRAVACHOL) 20 MG tablet   Dyslipidemia       Relevant Medications   pravastatin (PRAVACHOL) 20 MG tablet   Other Relevant Orders   Lipid panel   Comprehensive metabolic panel   Direct LDL   TSH   Vitamin D deficiency       Relevant Orders   VITAMIN D 25 Hydroxy (Vit-D Deficiency, Fractures)      I am having Sandeep I. Sallis start on pravastatin. I am also having her maintain her fish oil-omega-3 fatty acids, Calcium Carbonate-Vitamin D, multivitamin, Multiple Vitamins-Minerals (EYE VITAMINS PO), amLODipine, clopidogrel, metoprolol succinate, and telmisartan.  Meds ordered this encounter  Medications  . pravastatin (PRAVACHOL) 20 MG tablet    Sig: Take 1 tablet (20 mg total) by mouth daily.    Dispense:  90 tablet    Refill:  3     There are no discontinued medications.  Follow-up: No follow-ups on file.   Crecencio Mc, MD

## 2020-02-05 NOTE — Assessment & Plan Note (Signed)
She is willing to retry a statin.  Will start with QOD pravastatin

## 2020-02-05 NOTE — Patient Instructions (Addendum)
Your vaginal irritation is from too much washing  And scrubbing!  Use unscented baby wipes instead of a washcloth during the day between showers  Try using [place] ointment to protect your skin from the urine     I am prescribing pravastatin  For your cholesterol  Start with one tablet every other day with dinner  If you tolerate this ,  Try increasing to to once daily with dinner

## 2020-02-10 DIAGNOSIS — E782 Mixed hyperlipidemia: Secondary | ICD-10-CM | POA: Insufficient documentation

## 2020-02-10 NOTE — Addendum Note (Signed)
Addended by: Crecencio Mc on: 02/10/2020 12:00 PM   Modules accepted: Orders

## 2020-02-17 ENCOUNTER — Ambulatory Visit: Payer: Medicare Other | Admitting: Internal Medicine

## 2020-03-03 ENCOUNTER — Other Ambulatory Visit: Payer: Self-pay | Admitting: Physician Assistant

## 2020-03-10 ENCOUNTER — Other Ambulatory Visit (INDEPENDENT_AMBULATORY_CARE_PROVIDER_SITE_OTHER): Payer: Medicare Other

## 2020-03-10 ENCOUNTER — Other Ambulatory Visit: Payer: Self-pay

## 2020-03-10 DIAGNOSIS — E782 Mixed hyperlipidemia: Secondary | ICD-10-CM

## 2020-03-10 DIAGNOSIS — R300 Dysuria: Secondary | ICD-10-CM | POA: Diagnosis not present

## 2020-03-10 DIAGNOSIS — R944 Abnormal results of kidney function studies: Secondary | ICD-10-CM

## 2020-03-10 LAB — URINALYSIS, ROUTINE W REFLEX MICROSCOPIC
Bilirubin Urine: NEGATIVE
Hgb urine dipstick: NEGATIVE
Nitrite: NEGATIVE
Specific Gravity, Urine: 1.025 (ref 1.000–1.030)
Total Protein, Urine: NEGATIVE
Urine Glucose: NEGATIVE
Urobilinogen, UA: 0.2 (ref 0.0–1.0)
pH: 6 (ref 5.0–8.0)

## 2020-03-10 LAB — COMPREHENSIVE METABOLIC PANEL
ALT: 11 U/L (ref 0–35)
AST: 19 U/L (ref 0–37)
Albumin: 4.1 g/dL (ref 3.5–5.2)
Alkaline Phosphatase: 67 U/L (ref 39–117)
BUN: 22 mg/dL (ref 6–23)
CO2: 28 mEq/L (ref 19–32)
Calcium: 9.2 mg/dL (ref 8.4–10.5)
Chloride: 104 mEq/L (ref 96–112)
Creatinine, Ser: 1 mg/dL (ref 0.40–1.20)
GFR: 51.48 mL/min — ABNORMAL LOW (ref >60.00)
Glucose, Bld: 97 mg/dL (ref 70–99)
Potassium: 4.1 mEq/L (ref 3.5–5.1)
Sodium: 139 mEq/L (ref 135–145)
Total Bilirubin: 0.7 mg/dL (ref 0.2–1.2)
Total Protein: 6.7 g/dL (ref 6.0–8.3)

## 2020-03-13 NOTE — Progress Notes (Signed)
Thus far, There is no evidence of UTI by urinalysis.  However the culture is still pending  And will take another 24 hours. The  other labs are normal

## 2020-03-15 DIAGNOSIS — Z23 Encounter for immunization: Secondary | ICD-10-CM | POA: Diagnosis not present

## 2020-03-16 ENCOUNTER — Telehealth: Payer: Self-pay | Admitting: *Deleted

## 2020-03-16 NOTE — Telephone Encounter (Signed)
Urine culture was not collected or ran. Would need to be reordered & recollected if still needed

## 2020-03-16 NOTE — Telephone Encounter (Signed)
It was ordered,  But incorrectly as routine, so It was not seen by lab.   Please tell patient that there was no sign of UTI.  If symptoms persist we will need to recollect

## 2020-03-17 NOTE — Telephone Encounter (Signed)
Spoke with pt and informed her of her lab results. Pt stated that she is not having any symptoms.

## 2020-04-07 ENCOUNTER — Ambulatory Visit (INDEPENDENT_AMBULATORY_CARE_PROVIDER_SITE_OTHER): Payer: Medicare Other

## 2020-04-07 VITALS — Ht 61.0 in | Wt 126.0 lb

## 2020-04-07 DIAGNOSIS — Z0000 Encounter for general adult medical examination without abnormal findings: Secondary | ICD-10-CM

## 2020-04-07 NOTE — Patient Instructions (Addendum)
[name] , Thank you for taking time to come for your Medicare Wellness Visit. I appreciate your ongoing commitment to your health goals. Please review the following plan we discussed and let me know if I can assist you in the future.   These are the goals we discussed: Goals      Patient Stated   .  Weight (lb) < 121 lb (54.9 kg) (pt-stated)       This is a list of the screening recommended for you and due dates:  Health Maintenance  Topic Date Due  . Hemoglobin A1C  Never done  . Complete foot exam   Never done  . Eye exam for diabetics  Never done  . Flu Shot  05/07/2020*  . Tetanus Vaccine  04/02/2023  . COVID-19 Vaccine  Completed  . Pneumonia vaccines  Completed  . DEXA scan (bone density measurement)  Discontinued  *Topic was postponed. The date shown is not the original due date.    Immunizations Immunization History  Administered Date(s) Administered  . Influenza Split 03/20/2011, 03/13/2012, 03/09/2014  . Influenza, High Dose Seasonal PF 04/03/2018, 01/30/2019  . Influenza,inj,Quad PF,6+ Mos 03/31/2015  . Influenza-Unspecified 03/14/2013, 03/07/2014, 03/20/2016, 03/19/2017  . PFIZER SARS-COV-2 Vaccination 07/05/2019, 07/26/2019  . Pneumococcal Conjugate-13 03/23/2014  . Pneumococcal Polysaccharide-23 03/19/2010, 04/15/2019  . Tdap 04/01/2013  . Zoster 03/20/2011  . Zoster Recombinat (Shingrix) 01/26/2018, 04/19/2018, 07/30/2018   Keep all routine maintenance appointments.   Follow up 04/09/20 @ 10:30  Advanced directives: End of life planning; Advance aging; Advanced directives discussed.  Copy of current HCPOA/Living Will requested.    Conditions/risks identified: none new.   Follow up in one year for your annual wellness visit.   Preventive Care 75 Years and Older, Female Preventive care refers to lifestyle choices and visits with your health care provider that can promote health and wellness. What does preventive care include?  A yearly physical  exam. This is also called an annual well check.  Dental exams once or twice a year.  Routine eye exams. Ask your health care provider how often you should have your eyes checked.  Personal lifestyle choices, including:  Daily care of your teeth and gums.  Regular physical activity.  Eating a healthy diet.  Avoiding tobacco and drug use.  Limiting alcohol use.  Practicing safe sex.  Taking low-dose aspirin every day.  Taking vitamin and mineral supplements as recommended by your health care provider. What happens during an annual well check? The services and screenings done by your health care provider during your annual well check will depend on your age, overall health, lifestyle risk factors, and family history of disease. Counseling  Your health care provider may ask you questions about your:  Alcohol use.  Tobacco use.  Drug use.  Emotional well-being.  Home and relationship well-being.  Sexual activity.  Eating habits.  History of falls.  Memory and ability to understand (cognition).  Work and work Statistician.  Reproductive health. Screening  You may have the following tests or measurements:  Height, weight, and BMI.  Blood pressure.  Lipid and cholesterol levels. These may be checked every 5 years, or more frequently if you are over 74 years old.  Skin check.  Lung cancer screening. You may have this screening every year starting at age 104 if you have a 30-pack-year history of smoking and currently smoke or have quit within the past 15 years.  Fecal occult blood test (FOBT) of the stool. You may have  this test every year starting at age 53.  Flexible sigmoidoscopy or colonoscopy. You may have a sigmoidoscopy every 5 years or a colonoscopy every 10 years starting at age 72.  Hepatitis C blood test.  Hepatitis B blood test.  Sexually transmitted disease (STD) testing.  Diabetes screening. This is done by checking your blood sugar (glucose)  after you have not eaten for a while (fasting). You may have this done every 1-3 years.  Bone density scan. This is done to screen for osteoporosis. You may have this done starting at age 33.  Mammogram. This may be done every 1-2 years. Talk to your health care provider about how often you should have regular mammograms. Talk with your health care provider about your test results, treatment options, and if necessary, the need for more tests. Vaccines  Your health care provider may recommend certain vaccines, such as:  Influenza vaccine. This is recommended every year.  Tetanus, diphtheria, and acellular pertussis (Tdap, Td) vaccine. You may need a Td booster every 10 years.  Zoster vaccine. You may need this after age 48.  Pneumococcal 13-valent conjugate (PCV13) vaccine. One dose is recommended after age 62.  Pneumococcal polysaccharide (PPSV23) vaccine. One dose is recommended after age 44. Talk to your health care provider about which screenings and vaccines you need and how often you need them. This information is not intended to replace advice given to you by your health care provider. Make sure you discuss any questions you have with your health care provider. Document Released: 07/02/2015 Document Revised: 02/23/2016 Document Reviewed: 04/06/2015 Elsevier Interactive Patient Education  2017 [place] Prevention in the Home Falls can cause injuries. They can happen to people of all ages. There are many things you can do to make your home safe and to help prevent falls. What can I do on the outside of my home?  Regularly fix the edges of walkways and driveways and fix any cracks.  Remove anything that might make you trip as you walk through a door, such as a raised step or threshold.  Trim any bushes or trees on the path to your home.  Use bright outdoor lighting.  Clear any walking paths of anything that might make someone trip, such as rocks or  tools.  Regularly check to see if handrails are loose or broken. Make sure that both sides of any steps have handrails.  Any raised decks and porches should have guardrails on the edges.  Have any leaves, snow, or ice cleared regularly.  Use sand or salt on walking paths during winter.  Clean up any spills in your garage right away. This includes oil or grease spills. What can I do in the bathroom?  Use night lights.  Install grab bars by the toilet and in the tub and shower. Do not use towel bars as grab bars.  Use non-skid mats or decals in the tub or shower.  If you need to sit down in the shower, use a plastic, non-slip stool.  Keep the floor dry. Clean up any water that spills on the floor as soon as it happens.  Remove soap buildup in the tub or shower regularly.  Attach bath mats securely with double-sided non-slip rug tape.  Do not have throw rugs and other things on the floor that can make you trip. What can I do in the bedroom?  Use night lights.  Make sure that you have a light by your bed that is easy to  reach.  Do not use any sheets or blankets that are too big for your bed. They should not hang down onto the floor.  Have a firm chair that has side arms. You can use this for support while you get dressed.  Do not have throw rugs and other things on the floor that can make you trip. What can I do in the kitchen?  Clean up any spills right away.  Avoid walking on wet floors.  Keep items that you use a lot in easy-to-reach places.  If you need to reach something above you, use a strong step stool that has a grab bar.  Keep electrical cords out of the way.  Do not use floor polish or wax that makes floors slippery. If you must use wax, use non-skid floor wax.  Do not have throw rugs and other things on the floor that can make you trip. What can I do with my stairs?  Do not leave any items on the stairs.  Make sure that there are handrails on both  sides of the stairs and use them. Fix handrails that are broken or loose. Make sure that handrails are as long as the stairways.  Check any carpeting to make sure that it is firmly attached to the stairs. Fix any carpet that is loose or worn.  Avoid having throw rugs at the top or bottom of the stairs. If you do have throw rugs, attach them to the floor with carpet tape.  Make sure that you have a light switch at the top of the stairs and the bottom of the stairs. If you do not have them, ask someone to add them for you. What else can I do to help prevent falls?  Wear shoes that:  Do not have high heels.  Have rubber bottoms.  Are comfortable and fit you well.  Are closed at the toe. Do not wear sandals.  If you use a stepladder:  Make sure that it is fully opened. Do not climb a closed stepladder.  Make sure that both sides of the stepladder are locked into place.  Ask someone to hold it for you, if possible.  Clearly mark and make sure that you can see:  Any grab bars or handrails.  First and last steps.  Where the edge of each step is.  Use tools that help you move around (mobility aids) if they are needed. These include:  Canes.  Walkers.  Scooters.  Crutches.  Turn on the lights when you go into a dark area. Replace any light bulbs as soon as they burn out.  Set up your furniture so you have a clear path. Avoid moving your furniture around.  If any of your floors are uneven, fix them.  If there are any pets around you, be aware of where they are.  Review your medicines with your doctor. Some medicines can make you feel dizzy. This can increase your chance of falling. Ask your doctor what other things that you can do to help prevent falls. This information is not intended to replace advice given to you by your health care provider. Make sure you discuss any questions you have with your health care provider. Document Released: 04/01/2009 Document Revised:  11/11/2015 Document Reviewed: 07/10/2014 Elsevier Interactive Patient Education  2017 [employer].

## 2020-04-07 NOTE — Progress Notes (Addendum)
Subjective:   [name] is a 86 y.o. female who presents for Medicare Annual (Subsequent) preventive examination.  Review of Systems    No ROS.  Medicare Wellness Virtual Visit.         Objective:    Today's Vitals   04/07/20 0901  Weight: 126 lb (57.2 kg)  Height: 5\' 1"  (1.549 m)   Body mass index is 23.81 kg/m.  Advanced Directives 04/07/2020 04/07/2019 04/03/2018 04/02/2017 03/31/2016 03/31/2015 10/24/2014  Does Patient Have a Medical Advance Directive? Yes Yes Yes Yes Yes Yes Yes  Type of Paramedic of [place];Living will Healthcare Power of [place];Living will [place];Living will [place];Living will [place];Living will -  Does patient want to make changes to medical advance directive? No - Patient declined No - Patient declined No - Patient declined No - Patient declined - No - Patient declined -  Copy of [place] in Chart? No - copy requested No - copy requested No - copy requested No - copy requested No - copy requested No - copy requested No - copy requested    Current Medications (verified) Outpatient Encounter Medications as of 04/07/2020  Medication Sig   amLODipine (NORVASC) 5 MG tablet TAKE ONE TABLET BY MOUTH TWICE DAILY   Calcium Carbonate-Vitamin D (CALCIUM + D) 600-200 MG-UNIT TABS Take by mouth.     clopidogrel (PLAVIX) 75 MG tablet TAKE 1 TABLET BY MOUTH DAILY   fish oil-omega-3 fatty acids 1000 MG capsule Take by mouth daily.     metoprolol succinate (TOPROL-XL) 25 MG 24 hr tablet TAKE 1 AND ONE HALF TABLET BY MOUTH DAILY.   Multiple Vitamin (MULTIVITAMIN) tablet Take 1 tablet by mouth daily.     Multiple Vitamins-Minerals (EYE VITAMINS PO) Take 1 tablet by mouth daily.   pravastatin (PRAVACHOL) 20 MG tablet Take 1 tablet (20 mg total) by mouth daily.   telmisartan (MICARDIS) 80 MG tablet TAKE 1 TABLET BY MOUTH  NIGHTLY   No facility-administered encounter medications on file as of 04/07/2020.    Allergies (verified) Sulfa antibiotics and Contrast media [iodinated diagnostic agents]   History: Past Medical History:  Diagnosis Date   Carotid artery disease ([place])    Coronary artery disease, non-occlusive    Hyperlipidemia    Hypertension    Left bundle branch block    Nonischemic cardiomyopathy ([place])    Past Surgical History:  Procedure Laterality Date   ABDOMINAL HYSTERECTOMY     ankle     rods in both ankles   BREAST BIOPSY Bilateral YRS AGO   NEG   CARDIAC CATHETERIZATION     [facility]   left ankle  Octo 2012   s/p pinning , [place]   LEFT HEART CATHETERIZATION WITH CORONARY ANGIOGRAM N/A 10/31/2012   Procedure: LEFT HEART CATHETERIZATION WITH CORONARY ANGIOGRAM;  Surgeon: Clent Demark, MD;  Location: [facility];  Service: Cardiovascular;  Laterality: N/A;   Family History  Problem Relation Age of Onset   Hypertension Mother    Heart attack Mother    Hypertension Father    Cancer Father        stomach   Heart attack Brother    Stroke Sister    Breast cancer Neg Hx    Social History   Socioeconomic History   Marital status: Widowed    Spouse name: Not on file   Number of children: Not on file  Years of education: Not on file   Highest education level: Not on file  Occupational History   Not on file  Tobacco Use   Smoking status: Never Smoker   Smokeless tobacco: Never Used  Vaping Use   Vaping Use: Never used  Substance and Sexual Activity   Alcohol use: No   Drug use: No   Sexual activity: Not Currently  Other Topics Concern   Not on file  Social History Narrative   Not on file   Social Determinants of Health   Financial Resource Strain: Low Risk    Difficulty of Paying Living Expenses: Not hard at all  Food Insecurity: No Food Insecurity   Worried About Charity fundraiser in the Last Year: Never true   [place] in the Last Year: Never true    Transportation Needs: No Transportation Needs   Lack of Transportation (Medical): No   Lack of Transportation (Non-Medical): No  Physical Activity:    Days of Exercise per Week: Not on file   Minutes of Exercise per Session: Not on file  Stress: No Stress Concern Present   Feeling of Stress : Not at all  Social Connections: Moderately Integrated   Frequency of Communication with Friends and Family: Twice a week   Frequency of Social Gatherings with Friends and Family: Twice a week   Attends Religious Services: More than 4 times per year   Active Member of [employer] or Organizations: Yes   Attends Archivist Meetings: Not on file   Marital Status: Widowed    Tobacco Counseling Counseling given: Not Answered   Clinical Intake:  Pre-visit preparation completed: Yes           How often do you need to have someone help you when you read instructions, pamphlets, or other written materials from your doctor or pharmacy?: 1 - Never     Activities of Daily Living In your present state of health, do you have any difficulty performing the following activities: 04/07/2020  Hearing? N  Some recent data might be hidden    Patient Care Team: Crecencio Mc, MD as PCP - General (Internal Medicine)  Indicate any recent Medical Services you may have received from other than [facility] providers in the past year (date may be approximate).     Assessment:   This is a routine wellness examination for Abbigaile.  I connected with Dion today by telephone and verified that I am speaking with the correct person using two identifiers. Location patient: home Location provider: work Persons participating in the virtual visit: patient, Marine scientist.    I discussed the limitations, risks, security and privacy concerns of performing an evaluation and management service by telephone and the availability of in person appointments. The patient expressed understanding and verbally consented to this  telephonic visit.    Interactive audio and video telecommunications were attempted between this provider and patient, however failed, due to patient having technical difficulties OR patient did not have access to video capability.  We continued and completed visit with audio only.  Some vital signs may be absent or patient reported.   Hearing/Vision screen  Hearing Screening   125Hz  250Hz  500Hz  1000Hz  2000Hz  3000Hz  4000Hz  6000Hz  8000Hz   Right ear:           Left ear:           Comments: Patient is able to hear conversational tones without difficulty. No issues reported.   Vision Screening Comments: Wears corrective  lenses Visual acuity not assessed, virtual visit.  They have seen their ophthalmologist in the last 12 months.   Dietary issues and exercise activities discussed:   Healthy diet Good water intake  Goals       Patient Stated     Weight (lb) < 121 lb (54.9 kg) (pt-stated)       Depression Screen PHQ 2/9 Scores 04/07/2020 04/07/2019 04/03/2018 04/02/2017 03/31/2016 03/31/2015 03/23/2014  PHQ - 2 Score 0 0 0 0 0 0 0  PHQ- 9 Score - - - 2 - - -    Fall Risk Fall Risk  04/07/2020 02/05/2020 04/07/2019 04/07/2019 05/07/2018  Falls in the past year? 0 0 0 0 1  Comment - - - - Emmi Telephone Survey: data to providers prior to load  Number falls in past yr: 0 - - 0 1  Comment - - - - Emmi Telephone Survey Actual Response = 9  Injury with Fall? - - - - 1  Follow up Falls evaluation completed Falls evaluation completed Falls evaluation completed - -   Handrails in use when climbing stairs? Yes Home free of loose throw rugs in walkways, pet beds, electrical cords, etc? Yes  Adequate lighting in your home to reduce risk of falls? Yes   ASSISTIVE DEVICES UTILIZED TO PREVENT FALLS: Life alert? Yes  Use of a cane, walker or w/c? No  Grab bars in the bathroom? Yes   TIMED UP AND GO: Was the test performed? No . Virtual visit.   Cognitive Function: Patient is alert and  oriented x3.  Denies difficulty focusing, making decisions, memory loss.  Enjoys reading and completes word puzzles for brain health.   MMSE - Mini Mental State Exam 03/31/2016 03/31/2015  Orientation to time 5 5  Orientation to Place 5 5  Registration 3 3  Attention/ Calculation 5 5  Recall 3 3  Language- name 2 objects 2 2  Language- repeat 1 1  Language- follow 3 step command 3 3  Language- read & follow direction 1 1  Write a sentence 1 1  Copy design 1 1  Total score 30 30     6CIT Screen 04/07/2019 04/03/2018 04/02/2017  What Year? 0 points 0 points 0 points  What month? 0 points 0 points 0 points  What time? 0 points 0 points 0 points  Count back from 20 0 points 0 points 0 points  Months in reverse 0 points 0 points 2 points  Repeat phrase - 0 points 0 points  Total Score - 0 2    Immunizations Immunization History  Administered Date(s) Administered   Influenza Split 03/20/2011, 03/13/2012, 03/09/2014   Influenza, High Dose Seasonal PF 04/03/2018, 01/30/2019   Influenza,inj,Quad PF,6+ Mos 03/31/2015   Influenza-Unspecified 03/14/2013, 03/07/2014, 03/20/2016, 03/19/2017   PFIZER SARS-COV-2 Vaccination 07/05/2019, 07/26/2019   Pneumococcal Conjugate-13 03/23/2014   Pneumococcal Polysaccharide-23 03/19/2010, 04/15/2019   Tdap 04/01/2013   Zoster 03/20/2011   Zoster Recombinat (Shingrix) 01/26/2018, 04/19/2018, 07/30/2018   Health Maintenance Health Maintenance  Topic Date Due   HEMOGLOBIN A1C  Never done   FOOT EXAM  Never done   OPHTHALMOLOGY EXAM  Never done   INFLUENZA VACCINE  05/07/2020 (Originally 01/18/2020)   TETANUS/TDAP  04/02/2023   COVID-19 Vaccine  Completed   PNA vac Low Risk Adult  Completed   DEXA SCAN  Discontinued   Dental Screening: Recommended annual dental exams for proper oral hygiene  Community Resource Referral / Chronic Care Management: CRR required this visit?  No   CCM required this visit?  No      Plan:   Keep all routine  maintenance appointments.   Follow up 04/09/20 @ 10:30  I have personally reviewed and noted the following in the patient's chart:   Medical and social history Use of alcohol, tobacco or illicit drugs  Current medications and supplements Functional ability and status Nutritional status Physical activity Advanced directives List of other physicians Hospitalizations, surgeries, and ER visits in previous 12 months Vitals Screenings to include cognitive, depression, and falls Referrals and appointments  In addition, I have reviewed and discussed with patient certain preventive protocols, quality metrics, and best practice recommendations. A written personalized care plan for preventive services as well as general preventive health recommendations were provided to patient via mail.     OBrien-Blaney, Claudett Bayly L, LPN   [id number]/31/[id number]     I have reviewed the above information and agree with above.   Deborra Medina, MD

## 2020-04-09 ENCOUNTER — Ambulatory Visit (INDEPENDENT_AMBULATORY_CARE_PROVIDER_SITE_OTHER): Payer: Medicare Other | Admitting: Internal Medicine

## 2020-04-09 ENCOUNTER — Other Ambulatory Visit: Payer: Self-pay

## 2020-04-09 ENCOUNTER — Encounter: Payer: Self-pay | Admitting: Internal Medicine

## 2020-04-09 VITALS — BP 120/66 | HR 62 | Temp 98.1°F | Resp 15 | Ht 61.0 in | Wt 129.2 lb

## 2020-04-09 DIAGNOSIS — E785 Hyperlipidemia, unspecified: Secondary | ICD-10-CM | POA: Diagnosis not present

## 2020-04-09 DIAGNOSIS — Z0000 Encounter for general adult medical examination without abnormal findings: Secondary | ICD-10-CM

## 2020-04-09 DIAGNOSIS — I10 Essential (primary) hypertension: Secondary | ICD-10-CM

## 2020-04-09 DIAGNOSIS — E782 Mixed hyperlipidemia: Secondary | ICD-10-CM | POA: Diagnosis not present

## 2020-04-09 DIAGNOSIS — E1169 Type 2 diabetes mellitus with other specified complication: Secondary | ICD-10-CM | POA: Diagnosis not present

## 2020-04-09 LAB — COMPREHENSIVE METABOLIC PANEL
ALT: 12 U/L (ref 0–35)
AST: 20 U/L (ref 0–37)
Albumin: 4.1 g/dL (ref 3.5–5.2)
Alkaline Phosphatase: 73 U/L (ref 39–117)
BUN: 19 mg/dL (ref 6–23)
CO2: 29 mEq/L (ref 19–32)
Calcium: 9.3 mg/dL (ref 8.4–10.5)
Chloride: 103 mEq/L (ref 96–112)
Creatinine, Ser: 0.95 mg/dL (ref 0.40–1.20)
GFR: 50.95 mL/min — ABNORMAL LOW (ref >60.00)
Glucose, Bld: 91 mg/dL (ref 70–99)
Potassium: 4.7 mEq/L (ref 3.5–5.1)
Sodium: 139 mEq/L (ref 135–145)
Total Bilirubin: 0.7 mg/dL (ref 0.2–1.2)
Total Protein: 6.5 g/dL (ref 6.0–8.3)

## 2020-04-09 LAB — LIPID PANEL
Cholesterol: 236 mg/dL — ABNORMAL HIGH (ref 0–200)
HDL: 52.8 mg/dL (ref >39.00)
LDL Cholesterol: 148 mg/dL — ABNORMAL HIGH (ref 0–99)
NonHDL: 182.96
Total CHOL/HDL Ratio: 4
Triglycerides: 177 mg/dL — ABNORMAL HIGH (ref 0.0–149.0)
VLDL: 35.4 mg/dL (ref 0.0–40.0)

## 2020-04-09 NOTE — Assessment & Plan Note (Signed)
Taking pravastatin every 2 days due to o muscle weakness

## 2020-04-09 NOTE — Progress Notes (Signed)
Patient ID: [name], female    DOB: 03-Apr-1923  Age: 86 y.o. MRN: [id number]  The patient is here for follow up  and management of other chronic and acute problems.   The risk factors are reflected in the social history.  The roster of all physicians providing medical care to patient - is listed in the Snapshot section of the chart.  Activities of daily living:  The patient is 100% independent in all ADLs: dressing, toileting, feeding as well as independent mobility  Home safety : The patient has smoke detectors in the home. They wear seatbelts.  There are no firearms at home. There is no violence in the home.   There is no risks for hepatitis, STDs or HIV. There is no   history of blood transfusion. They have no travel history to infectious disease endemic areas of the world.  The patient has seen their dentist in the last six month. They have seen their eye doctor in the last year. They admit to slight hearing difficulty with regard to whispered voices and some television programs.  They have deferred audiologic testing in the last year.  They do not  have excessive sun exposure. Discussed the need for sun protection: hats, long sleeves and use of sunscreen if there is significant sun exposure.   Diet: the importance of a healthy diet is discussed. They do have a healthy diet.  The benefits of regular aerobic exercise were discussed. She walks 4 times per week ,  20 minutes.   Depression screen: there are no signs or vegative symptoms of depression- irritability, change in appetite, anhedonia, sadness/tearfullness.  Cognitive assessment: the patient manages all their financial and personal affairs and is actively engaged. They could relate day,date,year and events; recalled 2/3 objects at 3 minutes; performed clock-face test normally.  The following portions of the patient's history were reviewed and updated as appropriate: allergies, current medications, past family history, past  medical history,  past surgical history, past social history  and problem list.  Visual acuity was not assessed per patient preference since she has regular follow up with her ophthalmologist. Hearing and body mass index were assessed and reviewed.   During the course of the visit the patient was educated and counseled about appropriate screening and preventive services including : fall prevention , diabetes screening, nutrition counseling, colorectal cancer screening, and recommended immunizations.    CC: The primary encounter diagnosis was Mixed hyperlipidemia. Diagnoses of Primary hypertension and Encounter for preventive health examination were also pertinent to this visit.  Feeling fine.    Goes out to lunch almost daily.  Saturday on her own ,,  Shops . [employer]  No memory  Doing jigsaw puzzles for several hours daily.   Ophthalmic migraines , not occurring weekly.  Silver ring left eye. Gets  a mild headache.  Saw Dingledein and Sydnor   Insomnia managed with tylenol with benadryl without side effects.  Occasional constipation managed with MOM.  No falls.  Being very careful. Lives in a ground floor apt   Cutting back on snacks to lose weight.  Down    History Jaley has a past medical history of Carotid artery disease ([place]), Coronary artery disease, non-occlusive, Hyperlipidemia, Hypertension, Left bundle branch block, and Nonischemic cardiomyopathy ([place]).   She has a past surgical history that includes left ankle Abigail Miyamoto 2012); Abdominal hysterectomy; ankle; Cardiac catheterization; left heart catheterization with coronary angiogram (N/A, 10/31/2012); and Breast biopsy (Bilateral, YRS AGO).   Her family history  includes Cancer in her father; Heart attack in her brother and mother; Hypertension in her father and mother; Stroke in her sister.She reports that she has never smoked. She has never used smokeless tobacco. She reports that she does not drink alcohol and does not use drugs.  Outpatient  Medications Prior to Visit  Medication Sig Dispense Refill  . amLODipine (NORVASC) 5 MG tablet TAKE ONE TABLET BY MOUTH TWICE DAILY 180 tablet 1  . Calcium Carbonate-Vitamin D (CALCIUM + D) 600-200 MG-UNIT TABS Take by mouth.      . clopidogrel (PLAVIX) 75 MG tablet TAKE 1 TABLET BY MOUTH DAILY 90 tablet 3  . fish oil-omega-3 fatty acids 1000 MG capsule Take by mouth daily.      . metoprolol succinate (TOPROL-XL) 25 MG 24 hr tablet TAKE 1 AND ONE HALF TABLET BY MOUTH DAILY. 135 tablet 3  . Multiple Vitamin (MULTIVITAMIN) tablet Take 1 tablet by mouth daily.      . Multiple Vitamins-Minerals (EYE VITAMINS PO) Take 1 tablet by mouth daily.    . pravastatin (PRAVACHOL) 20 MG tablet Take 1 tablet (20 mg total) by mouth daily. 90 tablet 3  . telmisartan (MICARDIS) 80 MG tablet TAKE 1 TABLET BY MOUTH NIGHTLY 90 tablet 1   No facility-administered medications prior to visit.    Review of Systems   Patient denies headache, fevers, malaise, unintentional weight loss, skin rash, eye pain, sinus congestion and sinus pain, sore throat, dysphagia,  hemoptysis , cough, dyspnea, wheezing, chest pain, palpitations, orthopnea, edema, abdominal pain, nausea, melena, diarrhea, constipation, flank pain, dysuria, hematuria, urinary  Frequency, nocturia, numbness, tingling, seizures,  Focal weakness, Loss of consciousness,  Tremor, insomnia, depression, anxiety, and suicidal ideation.      Objective:  BP 120/66 (BP Location: Left Arm, Patient Position: Sitting, Cuff Size: Normal)   Pulse 62   Temp 98.1 F (36.7 C) (Oral)   Resp 15   Ht 5\' 1"  (1.549 m)   Wt 129 lb 3.2 oz (58.6 kg)   SpO2 97%   BMI 24.41 kg/m   Physical Exam  General appearance: alert, cooperative and appears stated age Ears: normal TM's and external ear canals both ears Throat: lips, mucosa, and tongue normal; teeth and gums normal Neck: no adenopathy, no carotid bruit, supple, symmetrical, trachea midline and thyroid not enlarged,  symmetric, no tenderness/mass/nodules Back: symmetric, no curvature. ROM normal. No CVA tenderness. Lungs: clear to auscultation bilaterally Heart: regular rate and rhythm, S1, S2 normal, no murmur, click, rub or gallop Abdomen: soft, non-tender; bowel sounds normal; no masses,  no organomegaly Pulses: 2+ and symmetric Skin: Skin color, texture, turgor normal. No rashes or lesions Lymph nodes: Cervical, supraclavicular, and axillary nodes normal. Neuro:  awake and interactive with normal mood and affect. Higher cortical functions are normal. Speech is clear without word-finding difficulty or dysarthria. Extraocular movements are intact. Visual fields of both eyes are grossly intact. Sensation to light touch is grossly intact bilaterally of upper and lower extremities. Motor examination shows 4+/5 symmetric hand grip and upper extremity and 5/5 lower extremity strength. There is no pronation or drift. Gait is non-ataxic    Assessment & Plan:   Problem List Items Addressed This Visit      Unprioritized   Encounter for preventive health examination    age appropriate education and counseling updated, ,immunizations reviewed and needs addressed, dietary  , most recent labs reviewed.  I have personally reviewed and have noted:  1) the patient's medical and social history  2) The pt's use of alcohol, tobacco, and illicit drugs 3) The patient's current medications and supplements 4) Functional ability including ADL's, fall risk, home safety risk, hearing and visual impairment 5) Diet and physical activities 6) Evidence for depression or mood disorder 7) The patient's height, weight, and BMI have been recorded in the chart  I have  provided counseling and education based on review of the above      Hypertension    Well controlled on current regimen .of amlodipine 5 mg,  telmisartan 80 mg,  And metoprolol 37.5 mg daily  Renal function stable, no changes today.  Lab Results  Component Value  Date   CREATININE 0.95 04/09/2020   Lab Results  Component Value Date   NA 139 04/09/2020   K 4.7 04/09/2020   CL 103 04/09/2020   CO2 29 04/09/2020         Relevant Orders   Comprehensive metabolic panel (Completed)   Mixed hyperlipidemia - Primary    tolerating every other day pravastatin without statin myalgias or liver enzyme elevation   Lab Results  Component Value Date   CHOL 236 (H) 04/09/2020   HDL 52.80 04/09/2020   LDLCALC 148 (H) 04/09/2020   LDLDIRECT 173.0 02/05/2020   TRIG 177.0 (H) 04/09/2020   CHOLHDL 4 04/09/2020   Lab Results  Component Value Date   ALT 12 04/09/2020   AST 20 04/09/2020   ALKPHOS 73 04/09/2020   BILITOT 0.7 04/09/2020          Relevant Orders   Lipid panel (Completed)      I am having Allyssia I. Hanger maintain her fish oil-omega-3 fatty acids, Calcium Carbonate-Vitamin D, multivitamin, Multiple Vitamins-Minerals (EYE VITAMINS PO), amLODipine, clopidogrel, telmisartan, pravastatin, and metoprolol succinate.  No orders of the defined types were placed in this encounter.   There are no discontinued medications.  Follow-up: Return in about 6 months (around 10/08/2020).   Crecencio Mc, MD

## 2020-04-09 NOTE — Patient Instructions (Signed)
You are doing so well!  Do not change a thing!    Health Maintenance After Age 84 After age 47, you are at a higher risk for certain long-term diseases and infections as well as injuries from falls. Falls are a major cause of broken bones and head injuries in people who are older than age 102. Getting regular preventive care can help to keep you healthy and well. Preventive care includes getting regular testing and making lifestyle changes as recommended by your health care provider. Talk with your health care provider about:  Which screenings and tests you should have. A screening is a test that checks for a disease when you have no symptoms.  A diet and exercise plan that is right for you. What should I know about screenings and tests to prevent falls? Screening and testing are the best ways to find a health problem early. Early diagnosis and treatment give you the best chance of managing medical conditions that are common after age 46. Certain conditions and lifestyle choices may make you more likely to have a fall. Your health care provider may recommend:  Regular vision checks. Poor vision and conditions such as cataracts can make you more likely to have a fall. If you wear glasses, make sure to get your prescription updated if your vision changes.  Medicine review. Work with your health care provider to regularly review all of the medicines you are taking, including over-the-counter medicines. Ask your health care provider about any side effects that may make you more likely to have a fall. Tell your health care provider if any medicines that you take make you feel dizzy or sleepy.  Osteoporosis screening. Osteoporosis is a condition that causes the bones to get weaker. This can make the bones weak and cause them to break more easily.  Blood pressure screening. Blood pressure changes and medicines to control blood pressure can make you feel dizzy.  Strength and balance checks. Your health  care provider may recommend certain tests to check your strength and balance while standing, walking, or changing positions.  Foot health exam. Foot pain and numbness, as well as not wearing proper footwear, can make you more likely to have a fall.  Depression screening. You may be more likely to have a fall if you have a fear of falling, feel emotionally low, or feel unable to do activities that you used to do.  Alcohol use screening. Using too much alcohol can affect your balance and may make you more likely to have a fall. What actions can I take to lower my risk of falls? General instructions  Talk with your health care provider about your risks for falling. Tell your health care provider if: ? You fall. Be sure to tell your health care provider about all falls, even ones that seem minor. ? You feel dizzy, sleepy, or off-balance.  Take over-the-counter and prescription medicines only as told by your health care provider. These include any supplements.  Eat a healthy diet and maintain a healthy weight. A healthy diet includes low-fat dairy products, low-fat (lean) meats, and fiber from whole grains, beans, and lots of fruits and vegetables. Home safety  Remove any tripping hazards, such as rugs, cords, and clutter.  Install safety equipment such as grab bars in bathrooms and safety rails on stairs.  Keep rooms and walkways well-lit. Activity   Follow a regular exercise program to stay fit. This will help you maintain your balance. Ask your health care provider what  types of exercise are appropriate for you.  If you need a cane or walker, use it as recommended by your health care provider.  Wear supportive shoes that have nonskid soles. Lifestyle  Do not drink alcohol if your health care provider tells you not to drink.  If you drink alcohol, limit how much you have: ? 0-1 drink a day for women. ? 0-2 drinks a day for men.  Be aware of how much alcohol is in your drink. In  the [place] one drink equals one typical bottle of beer (12 oz), one-half glass of wine (5 oz), or one shot of hard liquor (1 oz).  Do not use any products that contain nicotine or tobacco, such as cigarettes and e-cigarettes. If you need help quitting, ask your health care provider. Summary  Having a healthy lifestyle and getting preventive care can help to protect your health and wellness after age 38.  Screening and testing are the best way to find a health problem early and help you avoid having a fall. Early diagnosis and treatment give you the best chance for managing medical conditions that are more common for people who are older than age 53.  Falls are a major cause of broken bones and head injuries in people who are older than age 84. Take precautions to prevent a fall at home.  Work with your health care provider to learn what changes you can make to improve your health and wellness and to prevent falls. This information is not intended to replace advice given to you by your health care provider. Make sure you discuss any questions you have with your health care provider. Document Revised: 09/26/2018 Document Reviewed: 04/18/2017 Elsevier Patient Education  2020 [employer].

## 2020-04-11 DIAGNOSIS — Z0000 Encounter for general adult medical examination without abnormal findings: Secondary | ICD-10-CM | POA: Insufficient documentation

## 2020-04-11 DIAGNOSIS — M461 Sacroiliitis, not elsewhere classified: Secondary | ICD-10-CM | POA: Insufficient documentation

## 2020-04-11 NOTE — Assessment & Plan Note (Signed)
Well controlled on current regimen .of amlodipine 5 mg,  telmisartan 80 mg,  And metoprolol 37.5 mg daily  Renal function stable, no changes today.  Lab Results  Component Value Date   CREATININE 0.95 04/09/2020   Lab Results  Component Value Date   NA 139 04/09/2020   K 4.7 04/09/2020   CL 103 04/09/2020   CO2 29 04/09/2020

## 2020-04-11 NOTE — Assessment & Plan Note (Signed)
age appropriate education and counseling updated, ,immunizations reviewed and needs addressed, dietary  , most recent labs reviewed.  I have personally reviewed and have noted:  1) the patient's medical and social history 2) The pt's use of alcohol, tobacco, and illicit drugs 3) The patient's current medications and supplements 4) Functional ability including ADL's, fall risk, home safety risk, hearing and visual impairment 5) Diet and physical activities 6) Evidence for depression or mood disorder 7) The patient's height, weight, and BMI have been recorded in the chart  I have  provided counseling and education based on review of the above

## 2020-04-11 NOTE — Assessment & Plan Note (Signed)
tolerating every other day pravastatin without statin myalgias or liver enzyme elevation   Lab Results  Component Value Date   CHOL 236 (H) 04/09/2020   HDL 52.80 04/09/2020   LDLCALC 148 (H) 04/09/2020   LDLDIRECT 173.0 02/05/2020   TRIG 177.0 (H) 04/09/2020   CHOLHDL 4 04/09/2020   Lab Results  Component Value Date   ALT 12 04/09/2020   AST 20 04/09/2020   ALKPHOS 73 04/09/2020   BILITOT 0.7 04/09/2020

## 2020-06-16 ENCOUNTER — Other Ambulatory Visit: Payer: Self-pay | Admitting: Internal Medicine

## 2020-07-28 ENCOUNTER — Other Ambulatory Visit: Payer: Self-pay | Admitting: Internal Medicine

## 2020-07-31 IMAGING — MG DIGITAL SCREENING BILATERAL MAMMOGRAM WITH TOMO AND CAD
8 series · 8 of 24 positions shown · non-contrast
Comparison: Previous exam(s).

CLINICAL DATA: Screening.

EXAM:
DIGITAL SCREENING BILATERAL MAMMOGRAM WITH TOMO AND CAD

[L CC synth-2D]
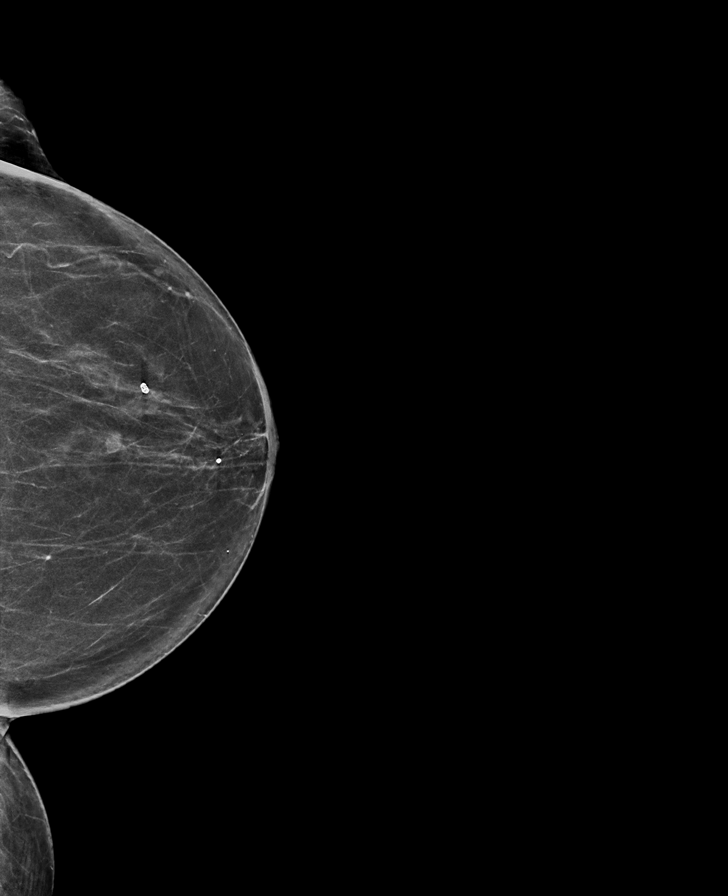

[L MLO synth-2D]
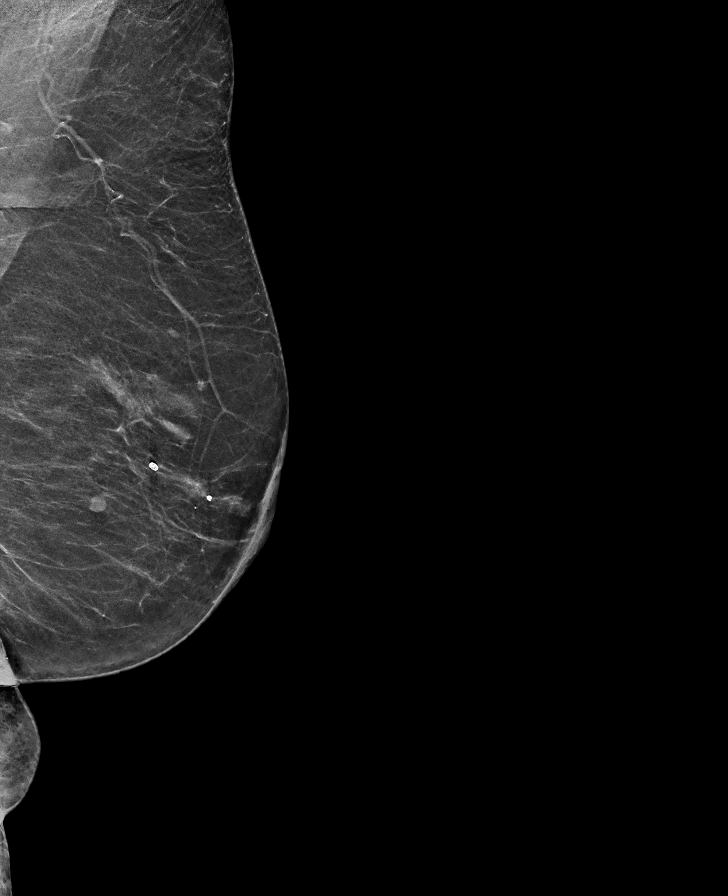

[R CC synth-2D]
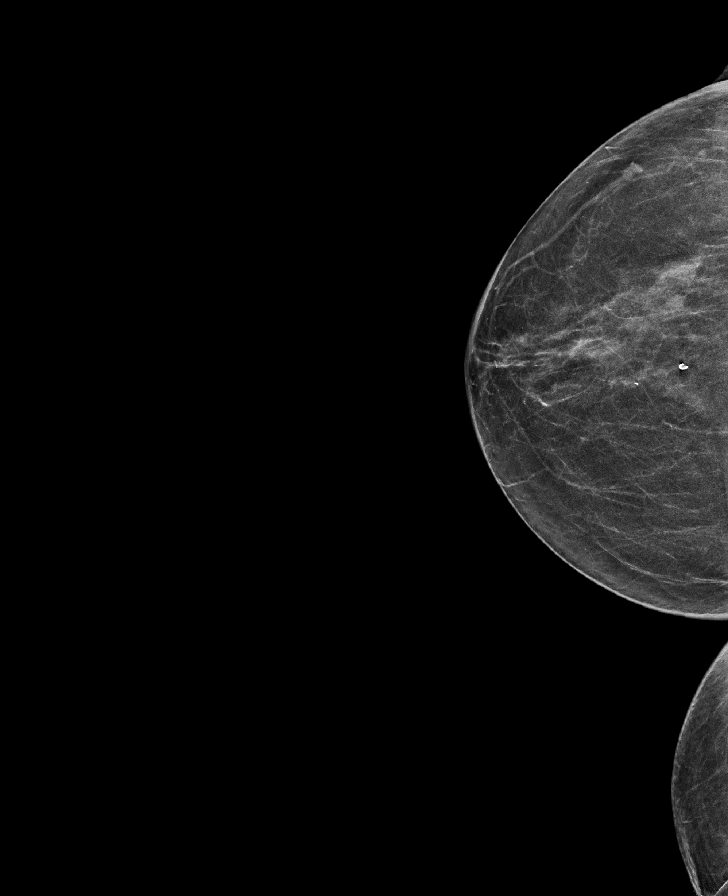

[R MLO synth-2D]
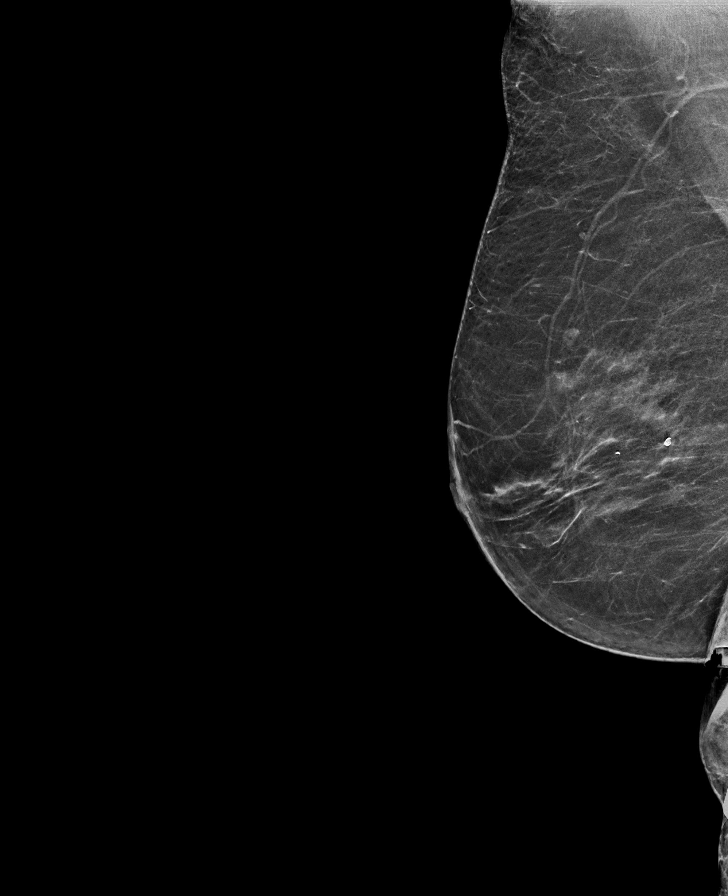

[R MLO tomo · tomo slice 28/55.0]
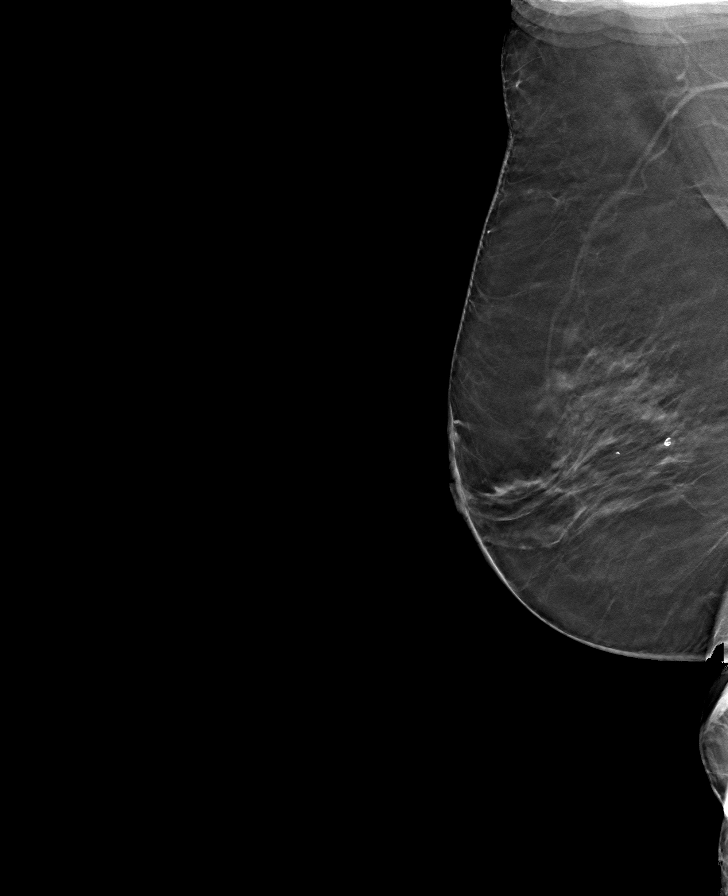

[L CC tomo · tomo slice 31/60.0]
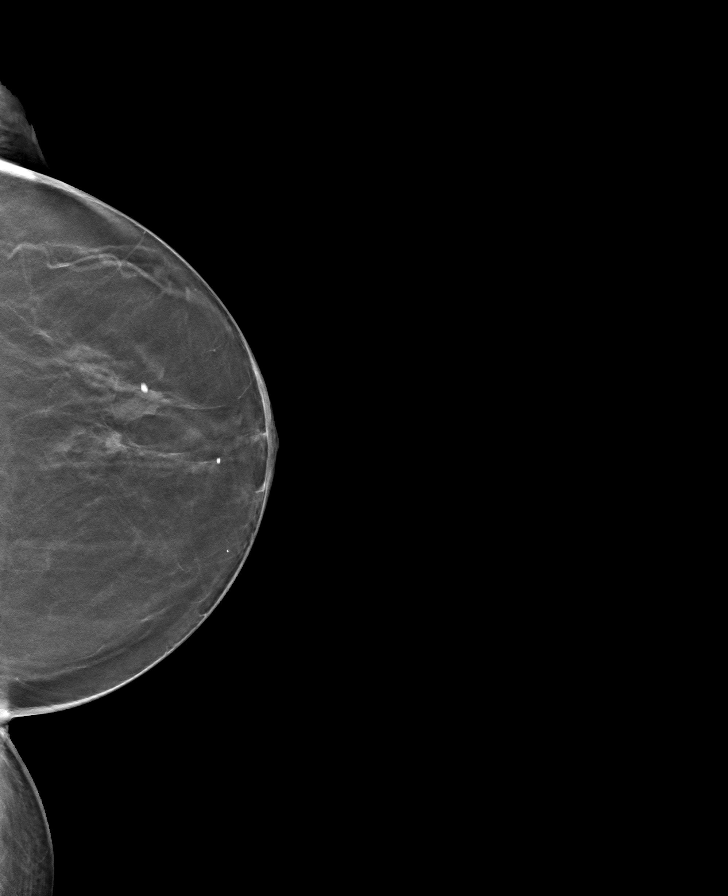

[L MLO tomo · tomo slice 29/56.0]
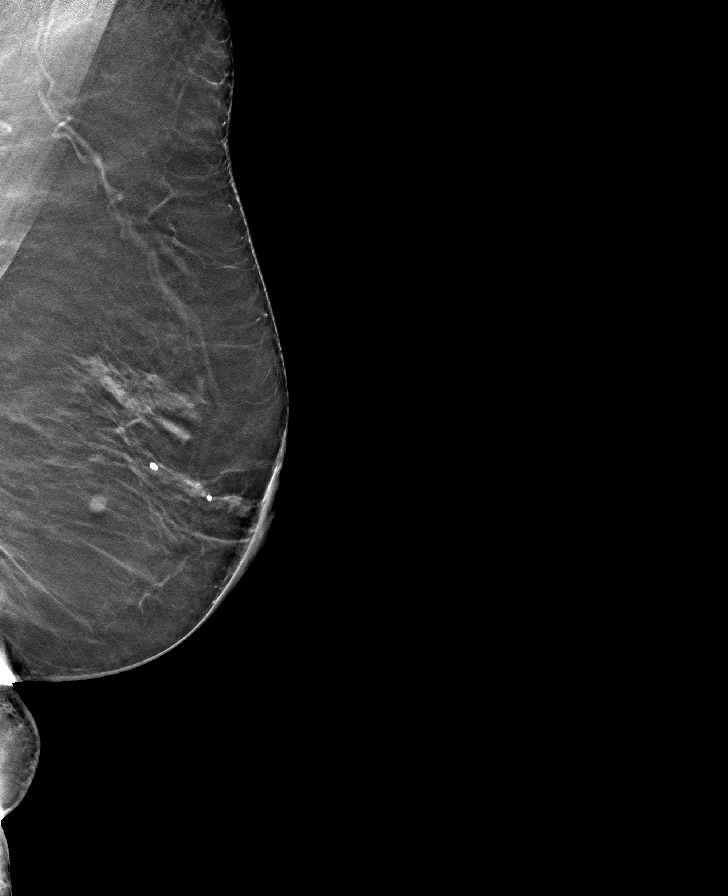

[R CC tomo · tomo slice 27/54.0]
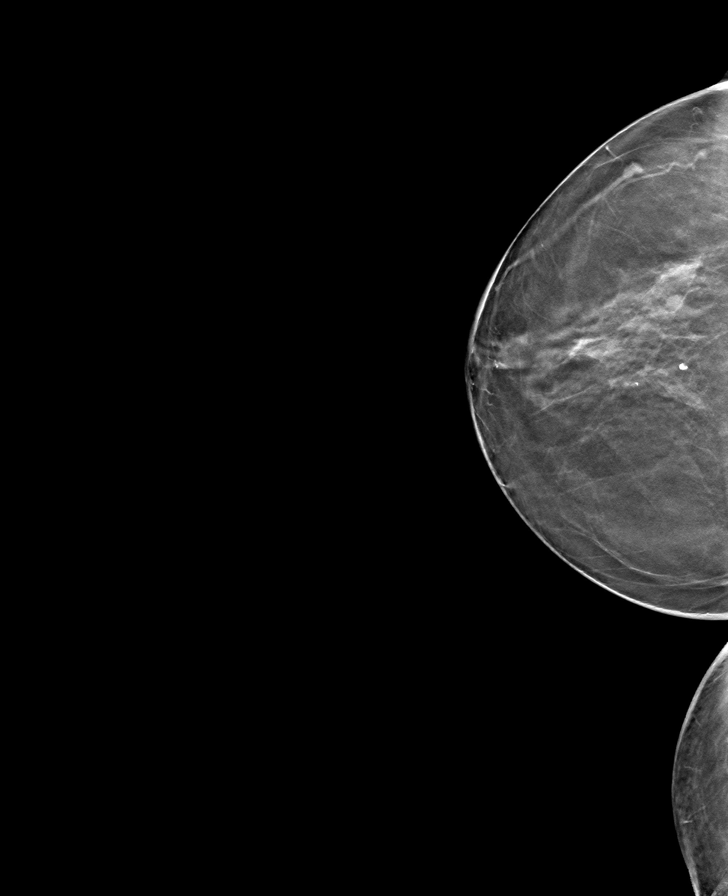

[8 of 24 positions shown; findings below may reference images not displayed]

ACR Breast Density Category b: There are scattered areas of
fibroglandular density.
FINDINGS: There are no findings suspicious for malignancy. Images were
processed with CAD.
IMPRESSION: No mammographic evidence of malignancy. A result letter of this
screening mammogram will be mailed directly to the patient.

RECOMMENDATION:
Screening mammogram in one year. (Code:[id number])

BI-RADS CATEGORY  1: Negative.

## 2020-08-11 ENCOUNTER — Other Ambulatory Visit: Payer: Self-pay | Admitting: Internal Medicine

## 2020-10-08 ENCOUNTER — Telehealth: Payer: Self-pay | Admitting: *Deleted

## 2020-10-08 DIAGNOSIS — E1169 Type 2 diabetes mellitus with other specified complication: Secondary | ICD-10-CM

## 2020-10-08 DIAGNOSIS — R002 Palpitations: Secondary | ICD-10-CM

## 2020-10-08 DIAGNOSIS — E785 Hyperlipidemia, unspecified: Secondary | ICD-10-CM

## 2020-10-08 DIAGNOSIS — I10 Essential (primary) hypertension: Secondary | ICD-10-CM

## 2020-10-08 NOTE — Telephone Encounter (Signed)
Please place future orders for lab appt.  

## 2020-10-11 ENCOUNTER — Other Ambulatory Visit: Payer: Self-pay

## 2020-10-11 ENCOUNTER — Other Ambulatory Visit (INDEPENDENT_AMBULATORY_CARE_PROVIDER_SITE_OTHER): Payer: Medicare Other

## 2020-10-11 DIAGNOSIS — R002 Palpitations: Secondary | ICD-10-CM

## 2020-10-11 DIAGNOSIS — I10 Essential (primary) hypertension: Secondary | ICD-10-CM

## 2020-10-11 DIAGNOSIS — E785 Hyperlipidemia, unspecified: Secondary | ICD-10-CM

## 2020-10-11 DIAGNOSIS — E1169 Type 2 diabetes mellitus with other specified complication: Secondary | ICD-10-CM

## 2020-10-11 LAB — COMPREHENSIVE METABOLIC PANEL
ALT: 13 U/L (ref 0–35)
AST: 19 U/L (ref 0–37)
Albumin: 3.9 g/dL (ref 3.5–5.2)
Alkaline Phosphatase: 72 U/L (ref 39–117)
BUN: 16 mg/dL (ref 6–23)
CO2: 29 mEq/L (ref 19–32)
Calcium: 10.4 mg/dL (ref 8.4–10.5)
Chloride: 105 mEq/L (ref 96–112)
Creatinine, Ser: 1.07 mg/dL (ref 0.40–1.20)
GFR: 44.02 mL/min — ABNORMAL LOW (ref >60.00)
Glucose, Bld: 81 mg/dL (ref 70–99)
Potassium: 4.6 mEq/L (ref 3.5–5.1)
Sodium: 141 mEq/L (ref 135–145)
Total Bilirubin: 0.7 mg/dL (ref 0.2–1.2)
Total Protein: 6.8 g/dL (ref 6.0–8.3)

## 2020-10-11 LAB — LIPID PANEL
Cholesterol: 226 mg/dL — ABNORMAL HIGH (ref 0–200)
HDL: 55.2 mg/dL (ref >39.00)
LDL Cholesterol: 143 mg/dL — ABNORMAL HIGH (ref 0–99)
NonHDL: 171.22
Total CHOL/HDL Ratio: 4
Triglycerides: 142 mg/dL (ref 0.0–149.0)
VLDL: 28.4 mg/dL (ref 0.0–40.0)

## 2020-10-11 LAB — CBC WITH DIFFERENTIAL/PLATELET
Basophils Absolute: 0 10*3/uL (ref 0.0–0.1)
Basophils Relative: 0.6 % (ref 0.0–3.0)
Eosinophils Absolute: 0.2 10*3/uL (ref 0.0–0.7)
Eosinophils Relative: 3.5 % (ref 0.0–5.0)
HCT: 45.2 % (ref 36.0–46.0)
Hemoglobin: 14.8 g/dL (ref 12.0–15.0)
Lymphocytes Relative: 24.9 % (ref 12.0–46.0)
Lymphs Abs: 1.7 10*3/uL (ref 0.7–4.0)
MCHC: 32.8 g/dL (ref 30.0–36.0)
MCV: 96 fl (ref 78.0–100.0)
Monocytes Absolute: 0.8 10*3/uL (ref 0.1–1.0)
Monocytes Relative: 11.5 % (ref 3.0–12.0)
Neutro Abs: 4 10*3/uL (ref 1.4–7.7)
Neutrophils Relative %: 59.5 % (ref 43.0–77.0)
Platelets: 198 10*3/uL (ref 150.0–400.0)
RBC: 4.71 Mil/uL (ref 3.87–5.11)
RDW: 13.9 % (ref 11.5–15.5)
WBC: 6.7 10*3/uL (ref 4.0–10.5)

## 2020-10-13 ENCOUNTER — Encounter: Payer: Self-pay | Admitting: Internal Medicine

## 2020-10-13 DIAGNOSIS — N183 Chronic kidney disease, stage 3 unspecified: Secondary | ICD-10-CM | POA: Insufficient documentation

## 2020-10-15 DIAGNOSIS — Z23 Encounter for immunization: Secondary | ICD-10-CM | POA: Diagnosis not present

## 2020-11-10 DIAGNOSIS — H40003 Preglaucoma, unspecified, bilateral: Secondary | ICD-10-CM | POA: Diagnosis not present

## 2020-11-23 DIAGNOSIS — H40003 Preglaucoma, unspecified, bilateral: Secondary | ICD-10-CM | POA: Diagnosis not present

## 2020-11-23 LAB — HM DIABETES EYE EXAM

## 2020-12-15 ENCOUNTER — Other Ambulatory Visit: Payer: Self-pay | Admitting: Internal Medicine

## 2021-01-26 ENCOUNTER — Other Ambulatory Visit: Payer: Self-pay | Admitting: Internal Medicine

## 2021-02-09 ENCOUNTER — Other Ambulatory Visit: Payer: Self-pay | Admitting: Internal Medicine

## 2021-03-02 ENCOUNTER — Other Ambulatory Visit: Payer: Self-pay | Admitting: Physician Assistant

## 2021-03-04 ENCOUNTER — Other Ambulatory Visit: Payer: Self-pay

## 2021-03-04 ENCOUNTER — Ambulatory Visit (INDEPENDENT_AMBULATORY_CARE_PROVIDER_SITE_OTHER): Payer: Medicare Other | Admitting: Cardiovascular Disease

## 2021-03-04 ENCOUNTER — Encounter: Payer: Self-pay | Admitting: Cardiovascular Disease

## 2021-03-04 VITALS — BP 136/70 | HR 60 | Ht 61.0 in | Wt 130.0 lb

## 2021-03-04 DIAGNOSIS — I5022 Chronic systolic (congestive) heart failure: Secondary | ICD-10-CM

## 2021-03-04 DIAGNOSIS — Z8673 Personal history of transient ischemic attack (TIA), and cerebral infarction without residual deficits: Secondary | ICD-10-CM | POA: Diagnosis not present

## 2021-03-04 DIAGNOSIS — E785 Hyperlipidemia, unspecified: Secondary | ICD-10-CM

## 2021-03-04 DIAGNOSIS — I10 Essential (primary) hypertension: Secondary | ICD-10-CM | POA: Diagnosis not present

## 2021-03-04 NOTE — Progress Notes (Signed)
Cardiology Office Note   Date:  03/04/2021   ID:  Jacqueline SLAGLE, DOB Dec 14, 1922, MRN [id number]  PCP:  Crecencio Mc, MD  Cardiologist:   Kathlyn Sacramento, MD   Chief Complaint  Patient presents with   Other    12 month f/u no complaints today. Meds reviewed verbally with pt.      History of Present Illness: [name] is a 87 y.o. female who presents for a follow up visit regarding nonischemic cardiomyopathy. She had cardiac catheterization in May 2014 which showed mild nonobstructive coronary artery disease involving the LAD and left circumflex. Ejection fraction was normal.  She does have left bundle branch block. Echocardiogram in November 2015 showed an ejection fraction of 45-50%, mild-to-moderate aortic regurgitation, mild mitral regurgitation and mild-to-moderate tricuspid regurgitation. Pulmonary pressure was normal.  She had TIA symptoms in June 2020.  Plavix was added to aspirin.  Carotid Doppler showed mild nonobstructive carotid disease.  MRI of the brain showed no evidence of acute or subacute infarct.  She was noted to have widespread intracranial atherosclerotic disease with possible flow-limiting stenosis in the proximal right MCA and proximal left PCA.  She had outpatient monitor which showed no evidence of atrial fibrillation although she was found to have frequent short episodes of SVT.  The dose of Toprol was increased to 37.5 mg.  She has been doing extremely well with no recent chest pain, shortness of breath or palpitations.  She takes her medications regularly.  She has history of intolerance to statins but was started on pravastatin twice weekly recently and has been doing well with that.   Past Medical History:  Diagnosis Date   Carotid artery disease ([place])    Coronary artery disease, non-occlusive    Hyperlipidemia    Hypertension    Left bundle branch block    Nonischemic cardiomyopathy ([place])     Past Surgical History:  Procedure Laterality  Date   ABDOMINAL HYSTERECTOMY     ankle     rods in both ankles   BREAST BIOPSY Bilateral YRS AGO   NEG   CARDIAC CATHETERIZATION     [facility]   left ankle  Octo 2012   s/p pinning , [place]   LEFT HEART CATHETERIZATION WITH CORONARY ANGIOGRAM N/A 10/31/2012   Procedure: LEFT HEART CATHETERIZATION WITH CORONARY ANGIOGRAM;  Surgeon: Clent Demark, MD;  Location: [facility];  Service: Cardiovascular;  Laterality: N/A;     Current Outpatient Medications  Medication Sig Dispense Refill   amLODipine (NORVASC) 5 MG tablet TAKE ONE TABLET BY MOUTH TWICE DAILY 180 tablet 0   Calcium Carbonate-Vitamin D 600-200 MG-UNIT TABS Take by mouth.       clopidogrel (PLAVIX) 75 MG tablet TAKE 1 TABLET BY MOUTH DAILY 90 tablet 3   fish oil-omega-3 fatty acids 1000 MG capsule Take by mouth daily.       metoprolol succinate (TOPROL-XL) 25 MG 24 hr tablet TAKE 1 AND ONE HALF TABLET BY MOUTH DAILY. 135 tablet 0   Multiple Vitamin (MULTIVITAMIN) tablet Take 1 tablet by mouth daily.       Multiple Vitamins-Minerals (EYE VITAMINS PO) Take 1 tablet by mouth daily.     pravastatin (PRAVACHOL) 20 MG tablet TAKE 1 TABLET BY MOUTH DAILY 90 tablet 0   telmisartan (MICARDIS) 80 MG tablet TAKE 1 TABLET BY MOUTH NIGHTLY 90 tablet 1   latanoprost (XALATAN) 0.005 % ophthalmic solution daily.     No current facility-administered medications for this  visit.    Allergies:   Sulfa antibiotics and Contrast media [iodinated diagnostic agents]    Social History:  The patient  reports that she has never smoked. She has never used smokeless tobacco. She reports that she does not drink alcohol and does not use drugs.   Family History:  The patient's family history includes Cancer in her father; Heart attack in her brother and mother; Hypertension in her father and mother; Stroke in her sister.    ROS:  Please see the history of present illness.   Otherwise, review of systems are positive for none.   All other systems are  reviewed and negative.    PHYSICAL EXAM: VS:  BP 136/70 (BP Location: Left Arm, Patient Position: Sitting, Cuff Size: Normal)   Pulse 60   Ht '5\' 1"'$  (1.549 m)   Wt 130 lb (59 kg)   SpO2 96%   BMI 24.56 kg/m  , BMI Body mass index is 24.56 kg/m. GEN: Well nourished, well developed, in no acute distress  HEENT: normal  Neck: no JVD, carotid bruits, or masses Cardiac: RRR; no rubs, or gallops,no edema . 1/ 6 systolic murmur in the aortic area Respiratory:  clear to auscultation bilaterally, normal work of breathing GI: soft, nontender, nondistended, + BS MS: no deformity or atrophy  Skin: warm and dry, no rash Neuro:  Strength and sensation are intact Psych: euthymic mood, full affect   EKG:  EKG is ordered today. The ekg ordered today demonstrates normal sinus rhythm with left bundle branch block.   Recent Labs: 10/11/2020: ALT 13; BUN 16; Creatinine, Ser 1.07; Hemoglobin 14.8; Platelets 198.0; Potassium 4.6; Sodium 141    Lipid Panel    Component Value Date/Time   CHOL 226 (H) 10/11/2020 0848   CHOL 226 (H) 09/16/2012 0542   TRIG 142.0 10/11/2020 0848   TRIG 109 09/16/2012 0542   HDL 55.20 10/11/2020 0848   HDL 60 09/16/2012 0542   CHOLHDL 4 10/11/2020 0848   VLDL 28.4 10/11/2020 0848   VLDL 22 09/16/2012 0542   LDLCALC 143 (H) 10/11/2020 0848   LDLCALC 144 (H) 09/16/2012 0542   LDLDIRECT 173.0 02/05/2020 1247      Wt Readings from Last 3 Encounters:  03/04/21 130 lb (59 kg)  04/09/20 129 lb 3.2 oz (58.6 kg)  04/07/20 126 lb (57.2 kg)        ASSESSMENT AND PLAN:  1.  Chronic systolic heart failure: Due to nonischemic cardiomyopathy. Ejection fraction was 45-50%. Currently New York Heart Association class II. No evidence of volume overload. Continue Toprol and telmisartan.    2. Essential hypertension: Blood pressure is well control on current medications.  3.  Hyperlipidemia: Intolerance to statins and Zetia.  However, she is not tolerating pravastatin  twice a week.  No indication for more aggressive lipid management considering her age.  4.  Previous TIA: On clopidogrel without aspirin.   Disposition:   FU with me in 12 months  Signed,  Kathlyn Sacramento, MD  03/04/2021 10:49 AM    [place]

## 2021-03-04 NOTE — Patient Instructions (Signed)

## 2021-03-09 DIAGNOSIS — H6123 Impacted cerumen, bilateral: Secondary | ICD-10-CM | POA: Diagnosis not present

## 2021-03-09 DIAGNOSIS — H93293 Other abnormal auditory perceptions, bilateral: Secondary | ICD-10-CM | POA: Diagnosis not present

## 2021-03-16 ENCOUNTER — Other Ambulatory Visit: Payer: Self-pay | Admitting: Internal Medicine

## 2021-04-06 DIAGNOSIS — Z23 Encounter for immunization: Secondary | ICD-10-CM | POA: Diagnosis not present

## 2021-04-08 ENCOUNTER — Ambulatory Visit (INDEPENDENT_AMBULATORY_CARE_PROVIDER_SITE_OTHER): Payer: Medicare Other

## 2021-04-08 VITALS — Ht 61.0 in | Wt 130.0 lb

## 2021-04-08 DIAGNOSIS — Z0000 Encounter for general adult medical examination without abnormal findings: Secondary | ICD-10-CM | POA: Diagnosis not present

## 2021-04-08 NOTE — Patient Instructions (Addendum)
[name] , Thank you for taking time to come for your Medicare Wellness Visit. I appreciate your ongoing commitment to your health goals. Please review the following plan we discussed and let me know if I can assist you in the future.   These are the goals we discussed:  Goals       Patient Stated     Weight (lb) < 121 lb (54.9 kg) (pt-stated)      Reduce sugar intake;portion control Walk more for exercise         This is a list of the screening recommended for you and due dates:  Health Maintenance  Topic Date Due   Complete foot exam   04/18/2021*   Hemoglobin A1C  04/18/2021*   COVID-19 Vaccine (4 - Booster for Pfizer series) 04/24/2021*   Eye exam for diabetics  11/23/2021   Tetanus Vaccine  04/02/2023   Pneumonia Vaccine  Completed   Flu Shot  Completed   Zoster (Shingles) Vaccine  Completed   HPV Vaccine  Aged Out   DEXA scan (bone density measurement)  Discontinued  *Topic was postponed. The date shown is not the original due date.   Advanced directives: End of life planning; Advance aging; Advanced directives discussed.  Copy of current HCPOA/Living Will requested.    Conditions/risks identified: none new  Next appointment: Follow up in one year for your annual wellness visit    Preventive Care 65 Years and Older, Female Preventive care refers to lifestyle choices and visits with your health care provider that can promote health and wellness. What does preventive care include? A yearly physical exam. This is also called an annual well check. Dental exams once or twice a year. Routine eye exams. Ask your health care provider how often you should have your eyes checked. Personal lifestyle choices, including: Daily care of your teeth and gums. Regular physical activity. Eating a healthy diet. Avoiding tobacco and drug use. Limiting alcohol use. Practicing safe sex. Taking low-dose aspirin every day. Taking vitamin and mineral supplements as recommended by  your health care provider. What happens during an annual well check? The services and screenings done by your health care provider during your annual well check will depend on your age, overall health, lifestyle risk factors, and family history of disease. Counseling  Your health care provider may ask you questions about your: Alcohol use. Tobacco use. Drug use. Emotional well-being. Home and relationship well-being. Sexual activity. Eating habits. History of falls. Memory and ability to understand (cognition). Work and work Statistician. Reproductive health. Screening  You may have the following tests or measurements: Height, weight, and BMI. Blood pressure. Lipid and cholesterol levels. These may be checked every 5 years, or more frequently if you are over 88 years old. Skin check. Lung cancer screening. You may have this screening every year starting at age 81 if you have a 30-pack-year history of smoking and currently smoke or have quit within the past 15 years. Fecal occult blood test (FOBT) of the stool. You may have this test every year starting at age 3. Flexible sigmoidoscopy or colonoscopy. You may have a sigmoidoscopy every 5 years or a colonoscopy every 10 years starting at age 31. Hepatitis C blood test. Hepatitis B blood test. Sexually transmitted disease (STD) testing. Diabetes screening. This is done by checking your blood sugar (glucose) after you have not eaten for a while (fasting). You may have this done every 1-3 years. Bone density scan. This is done to screen for  osteoporosis. You may have this done starting at age 66. Mammogram. This may be done every 1-2 years. Talk to your health care provider about how often you should have regular mammograms. Talk with your health care provider about your test results, treatment options, and if necessary, the need for more tests. Vaccines  Your health care provider may recommend certain vaccines, such as: Influenza  vaccine. This is recommended every year. Tetanus, diphtheria, and acellular pertussis (Tdap, Td) vaccine. You may need a Td booster every 10 years. Zoster vaccine. You may need this after age 25. Pneumococcal 13-valent conjugate (PCV13) vaccine. One dose is recommended after age 49. Pneumococcal polysaccharide (PPSV23) vaccine. One dose is recommended after age 69. Talk to your health care provider about which screenings and vaccines you need and how often you need them. This information is not intended to replace advice given to you by your health care provider. Make sure you discuss any questions you have with your health care provider. Document Released: 07/02/2015 Document Revised: 02/23/2016 Document Reviewed: 04/06/2015 Elsevier Interactive Patient Education  2017 [place] Prevention in the Home Falls can cause injuries. They can happen to people of all ages. There are many things you can do to make your home safe and to help prevent falls. What can I do on the outside of my home? Regularly fix the edges of walkways and driveways and fix any cracks. Remove anything that might make you trip as you walk through a door, such as a raised step or threshold. Trim any bushes or trees on the path to your home. Use bright outdoor lighting. Clear any walking paths of anything that might make someone trip, such as rocks or tools. Regularly check to see if handrails are loose or broken. Make sure that both sides of any steps have handrails. Any raised decks and porches should have guardrails on the edges. Have any leaves, snow, or ice cleared regularly. Use sand or salt on walking paths during winter. Clean up any spills in your garage right away. This includes oil or grease spills. What can I do in the bathroom? Use night lights. Install grab bars by the toilet and in the tub and shower. Do not use towel bars as grab bars. Use non-skid mats or decals in the tub or shower. If you  need to sit down in the shower, use a plastic, non-slip stool. Keep the floor dry. Clean up any water that spills on the floor as soon as it happens. Remove soap buildup in the tub or shower regularly. Attach bath mats securely with double-sided non-slip rug tape. Do not have throw rugs and other things on the floor that can make you trip. What can I do in the bedroom? Use night lights. Make sure that you have a light by your bed that is easy to reach. Do not use any sheets or blankets that are too big for your bed. They should not hang down onto the floor. Have a firm chair that has side arms. You can use this for support while you get dressed. Do not have throw rugs and other things on the floor that can make you trip. What can I do in the kitchen? Clean up any spills right away. Avoid walking on wet floors. Keep items that you use a lot in easy-to-reach places. If you need to reach something above you, use a strong step stool that has a grab bar. Keep electrical cords out of the way. Do not  use floor polish or wax that makes floors slippery. If you must use wax, use non-skid floor wax. Do not have throw rugs and other things on the floor that can make you trip. What can I do with my stairs? Do not leave any items on the stairs. Make sure that there are handrails on both sides of the stairs and use them. Fix handrails that are broken or loose. Make sure that handrails are as long as the stairways. Check any carpeting to make sure that it is firmly attached to the stairs. Fix any carpet that is loose or worn. Avoid having throw rugs at the top or bottom of the stairs. If you do have throw rugs, attach them to the floor with carpet tape. Make sure that you have a light switch at the top of the stairs and the bottom of the stairs. If you do not have them, ask someone to add them for you. What else can I do to help prevent falls? Wear shoes that: Do not have high heels. Have rubber  bottoms. Are comfortable and fit you well. Are closed at the toe. Do not wear sandals. If you use a stepladder: Make sure that it is fully opened. Do not climb a closed stepladder. Make sure that both sides of the stepladder are locked into place. Ask someone to hold it for you, if possible. Clearly mark and make sure that you can see: Any grab bars or handrails. First and last steps. Where the edge of each step is. Use tools that help you move around (mobility aids) if they are needed. These include: Canes. Walkers. Scooters. Crutches. Turn on the lights when you go into a dark area. Replace any light bulbs as soon as they burn out. Set up your furniture so you have a clear path. Avoid moving your furniture around. If any of your floors are uneven, fix them. If there are any pets around you, be aware of where they are. Review your medicines with your doctor. Some medicines can make you feel dizzy. This can increase your chance of falling. Ask your doctor what other things that you can do to help prevent falls. This information is not intended to replace advice given to you by your health care provider. Make sure you discuss any questions you have with your health care provider. Document Released: 04/01/2009 Document Revised: 11/11/2015 Document Reviewed: 07/10/2014 Elsevier Interactive Patient Education  2017 [employer].

## 2021-04-08 NOTE — Progress Notes (Addendum)
Subjective:   [name] is a 87 y.o. female who presents for Medicare Annual (Subsequent) preventive examination.  Review of Systems    No ROS.  Medicare Wellness Virtual Visit.  Visual/audio telehealth visit, UTA vital signs.   See social history for additional risk factors.   Cardiac Risk Factors include: advanced age (>6men, >55 women)     Objective:    Today's Vitals   04/08/21 0906  Weight: 130 lb (59 kg)  Height: 5\' 1"  (1.549 m)   Body mass index is 24.56 kg/m.  Advanced Directives 04/08/2021 04/07/2020 04/07/2019 04/03/2018 04/02/2017 03/31/2016 03/31/2015  Does Patient Have a Medical Advance Directive? Yes Yes Yes Yes Yes Yes Yes  Type of Paramedic of [place];Living will [place];Living will Healthcare Power of [place];Living will [place];Living will [place];Living will [place];Living will  Does patient want to make changes to medical advance directive? No - Patient declined No - Patient declined No - Patient declined No - Patient declined No - Patient declined - No - Patient declined  Copy of [place] in Chart? No - copy requested No - copy requested No - copy requested No - copy requested No - copy requested No - copy requested No - copy requested    Current Medications (verified) Outpatient Encounter Medications as of 04/08/2021  Medication Sig   amLODipine (NORVASC) 5 MG tablet TAKE ONE TABLET BY MOUTH TWICE DAILY   Calcium Carbonate-Vitamin D 600-200 MG-UNIT TABS Take by mouth.     clopidogrel (PLAVIX) 75 MG tablet TAKE 1 TABLET BY MOUTH DAILY   fish oil-omega-3 fatty acids 1000 MG capsule Take by mouth daily.     latanoprost (XALATAN) 0.005 % ophthalmic solution daily.   metoprolol succinate (TOPROL-XL) 25 MG 24 hr tablet TAKE 1 AND ONE HALF TABLET BY MOUTH DAILY.   Multiple Vitamin (MULTIVITAMIN)  tablet Take 1 tablet by mouth daily.     Multiple Vitamins-Minerals (EYE VITAMINS PO) Take 1 tablet by mouth daily.   pravastatin (PRAVACHOL) 20 MG tablet TAKE 1 TABLET BY MOUTH DAILY   telmisartan (MICARDIS) 80 MG tablet TAKE 1 TABLET BY MOUTH NIGHTLY   No facility-administered encounter medications on file as of 04/08/2021.    Allergies (verified) Sulfa antibiotics and Contrast media [iodinated diagnostic agents]   History: Past Medical History:  Diagnosis Date   Carotid artery disease ([place])    Coronary artery disease, non-occlusive    Hyperlipidemia    Hypertension    Left bundle branch block    Nonischemic cardiomyopathy ([place])    Past Surgical History:  Procedure Laterality Date   ABDOMINAL HYSTERECTOMY     ankle     rods in both ankles   BREAST BIOPSY Bilateral YRS AGO   NEG   CARDIAC CATHETERIZATION     [facility]   left ankle  Octo 2012   s/p pinning , [place]   LEFT HEART CATHETERIZATION WITH CORONARY ANGIOGRAM N/A 10/31/2012   Procedure: LEFT HEART CATHETERIZATION WITH CORONARY ANGIOGRAM;  Surgeon: Clent Demark, MD;  Location: [facility];  Service: Cardiovascular;  Laterality: N/A;   Family History  Problem Relation Age of Onset   Hypertension Mother    Heart attack Mother    Hypertension Father    Cancer Father        stomach   Heart attack Brother    Stroke Sister    Breast cancer Neg  Hx    Social History   Socioeconomic History   Marital status: Widowed    Spouse name: Not on file   Number of children: Not on file   Years of education: Not on file   Highest education level: Not on file  Occupational History   Not on file  Tobacco Use   Smoking status: Never   Smokeless tobacco: Never  Vaping Use   Vaping Use: Never used  Substance and Sexual Activity   Alcohol use: No   Drug use: No   Sexual activity: Not Currently  Other Topics Concern   Not on file  Social History Narrative   Not on file   Social Determinants of Health   Financial  Resource Strain: Not on file  Food Insecurity: Not on file  Transportation Needs: Not on file  Physical Activity: Not on file  Stress: Not on file  Social Connections: Not on file    Tobacco Counseling Counseling given: Not Answered   Clinical Intake:  Pre-visit preparation completed: Yes        Diabetes: No  How often do you need to have someone help you when you read instructions, pamphlets, or other written materials from your doctor or pharmacy?: 1 - Never  Interpreter Needed?: No      Activities of Daily Living In your present state of health, do you have any difficulty performing the following activities: 04/08/2021  Hearing? N  Vision? N  Difficulty concentrating or making decisions? N  Walking or climbing stairs? N  Dressing or bathing? N  Doing errands, shopping? N  Preparing Food and eating ? N  Using the Toilet? N  In the past six months, have you accidently leaked urine? Y  Comment Managed with daily pad  Do you have problems with loss of bowel control? N  Managing your Medications? N  Managing your Finances? N  Housekeeping or managing your Housekeeping? Y  Comment Maid assist  Some recent data might be hidden    Patient Care Team: Crecencio Mc, MD as PCP - General (Internal Medicine)  Indicate any recent Medical Services you may have received from other than [facility] providers in the past year (date may be approximate).     Assessment:   This is a routine wellness examination for [name].  I connected with [name] today by telephone and verified that I am speaking with the correct person using two identifiers. Location patient: home Location provider: work Persons participating in the virtual visit: patient, Marine scientist.    I discussed the limitations, risks, security and privacy concerns of performing an evaluation and management service by telephone and the availability of in person appointments. The patient expressed understanding and verbally  consented to this telephonic visit.    Interactive audio and video telecommunications were attempted between this provider and patient, however failed, due to patient having technical difficulties OR patient did not have access to video capability.  We continued and completed visit with audio only.  Some vital signs may be absent or patient reported.   Hearing/Vision screen Hearing Screening - Comments:: Patient is able to hear conversational tones without difficulty.  No issues reported. Vision Screening - Comments:: Followed by [facility] Wears corrective lenses  They have regular follow up with the ophthalmologist  Dietary issues and exercise activities discussed: Current Exercise Habits: Home exercise routine, Type of exercise: walking, Time (Minutes): 15, Frequency (Times/Week): 5, Weekly Exercise (Minutes/Week): 75, Intensity: Mild Regular diet Good water intake  Goals Addressed               This Visit's Progress     Patient Stated     Weight (lb) < 121 lb (54.9 kg) (pt-stated)   130 lb (59 kg)     Reduce sugar intake;portion control Walk more for exercise        Depression Screen PHQ 2/9 Scores 04/08/2021 04/07/2020 04/07/2019 04/03/2018 04/02/2017 03/31/2016 03/31/2015  PHQ - 2 Score 0 0 0 0 0 0 0  PHQ- 9 Score - - - - 2 - -    Fall Risk Fall Risk  04/08/2021 04/09/2020 04/07/2020 02/05/2020 04/07/2019  Falls in the past year? 0 0 0 0 0  Comment - - - - -  Number falls in past yr: 0 - 0 - -  Comment - - - - -  Injury with Fall? - - - - -  Follow up Falls evaluation completed Falls evaluation completed Falls evaluation completed Falls evaluation completed Falls evaluation completed    [place]: Any stairs in or around the home? No  If so, are there any without handrails? No  Home free of loose throw rugs in walkways, pet beds, electrical cords, etc? Yes  Adequate lighting in your home to reduce risk of falls? Yes    ASSISTIVE DEVICES UTILIZED TO PREVENT FALLS: Life alert? Yes  Use of a cane, walker or w/c? No  Grab bars in the bathroom? Yes  Shower chair or bench in shower? Yes  Elevated toilet seat or a handicapped toilet? Yes   TIMED UP AND GO: Was the test performed? No . Virtual visit.   Cognitive Function: Patient is alert and oriented x3.  Manages her own finances and medications.  Enjoys reading and puzzles.   MMSE - Mini Mental State Exam 03/31/2016 03/31/2015  Orientation to time 5 5  Orientation to Place 5 5  Registration 3 3  Attention/ Calculation 5 5  Recall 3 3  Language- name 2 objects 2 2  Language- repeat 1 1  Language- follow 3 step command 3 3  Language- read & follow direction 1 1  Write a sentence 1 1  Copy design 1 1  Total score 30 30     6CIT Screen 04/07/2019 04/03/2018 04/02/2017  What Year? 0 points 0 points 0 points  What month? 0 points 0 points 0 points  What time? 0 points 0 points 0 points  Count back from 20 0 points 0 points 0 points  Months in reverse 0 points 0 points 2 points  Repeat phrase - 0 points 0 points  Total Score - 0 2    Immunizations Immunization History  Administered Date(s) Administered   Fluad Quad(high Dose 65+) 04/06/2021   Influenza Split 03/20/2011, 03/13/2012, 03/09/2014   Influenza, High Dose Seasonal PF 04/03/2018, 01/30/2019   Influenza,inj,Quad PF,6+ Mos 03/31/2015   Influenza-Unspecified 03/14/2013, 03/07/2014, 03/20/2016, 03/19/2017, 04/02/2020   PFIZER(Purple Top)SARS-COV-2 Vaccination 07/05/2019, 07/26/2019, 03/19/2020   Pneumococcal Conjugate-13 03/23/2014   Pneumococcal Polysaccharide-23 03/19/2010, 04/15/2019   Tdap 04/01/2013   Zoster Recombinat (Shingrix) 01/26/2018, 04/19/2018, 07/30/2018   Zoster, Live 03/20/2011   Labs- followed by pcp. CPE scheduled 04/18/21. A1c deferred.   Foot exam- notes no change, wounds, numbness, tingling. Followed by pcp. Deferred for cpe.   Covid-19 vaccine status:  Completed vaccines. Next booster scheduled 04/29/21. Agrees to update immunization record.   Zostavax completed Yes   Shingrix Completed?: Yes  Screening Tests Health Maintenance  Topic Date Due   FOOT EXAM  04/18/2021 (Originally 10/30/1932)   HEMOGLOBIN A1C  04/18/2021 (Originally Oct 21, 1922)   COVID-19 Vaccine (4 - Booster for [place] series) 04/24/2021 (Originally 05/14/2020)   OPHTHALMOLOGY EXAM  11/23/2021   TETANUS/TDAP  04/02/2023   Pneumonia Vaccine 89+ Years old  Completed   INFLUENZA VACCINE  Completed   Zoster Vaccines- Shingrix  Completed   HPV VACCINES  Aged Out   DEXA SCAN  Discontinued    Health Maintenance  There are no preventive care reminders to display for this patient.  Colorectal cancer screening: No longer required.   Mammogram status: No longer required due to aged out.  Lung Cancer Screening: (Low Dose CT Chest recommended if Age 34-80 years, 30 pack-year currently smoking OR have quit w/in 15years.) does not qualify.   Hepatitis C Screening: does not qualify  Vision Screening: Recommended annual ophthalmology exams for early detection of glaucoma and other disorders of the eye. Is the patient up to date with their annual eye exam?  Yes  Who is the provider or what is the name of the office in which the patient attends annual eye exams? [facility], Dr. Jeni Salles.   Dental Screening: Recommended annual dental exams for proper oral hygiene  Community Resource Referral / Chronic Care Management: CRR required this visit?  No   CCM required this visit?  No      Plan:   Keep all routine maintenance appointments.   I have personally reviewed and noted the following in the patient's chart:   Medical and social history Use of alcohol, tobacco or illicit drugs  Current medications and supplements including opioid prescriptions. Not taking opioids.  Functional ability and status Nutritional status Physical activity Advanced  directives List of other physicians Hospitalizations, surgeries, and ER visits in previous 12 months Vitals Screenings to include cognitive, depression, and falls Referrals and appointments  In addition, I have reviewed and discussed with patient certain preventive protocols, quality metrics, and best practice recommendations. A written personalized care plan for preventive services as well as general preventive health recommendations were provided to patient.     OBrien-Blaney, [place] Gilpatrick L, LPN   66/11/3014    I have reviewed the above information and agree with above.   Deborra Medina, MD

## 2021-04-18 ENCOUNTER — Other Ambulatory Visit: Payer: Self-pay

## 2021-04-18 ENCOUNTER — Encounter: Payer: Self-pay | Admitting: Internal Medicine

## 2021-04-18 ENCOUNTER — Ambulatory Visit (INDEPENDENT_AMBULATORY_CARE_PROVIDER_SITE_OTHER): Payer: Medicare Other | Admitting: Internal Medicine

## 2021-04-18 VITALS — BP 128/64 | HR 55 | Temp 96.2°F | Ht 61.0 in | Wt 132.2 lb

## 2021-04-18 DIAGNOSIS — N1831 Chronic kidney disease, stage 3a: Secondary | ICD-10-CM

## 2021-04-18 DIAGNOSIS — E782 Mixed hyperlipidemia: Secondary | ICD-10-CM | POA: Diagnosis not present

## 2021-04-18 DIAGNOSIS — I5022 Chronic systolic (congestive) heart failure: Secondary | ICD-10-CM | POA: Diagnosis not present

## 2021-04-18 LAB — LIPID PANEL
Cholesterol: 270 mg/dL — ABNORMAL HIGH (ref 0–200)
HDL: 55.1 mg/dL (ref >39.00)
LDL Cholesterol: 180 mg/dL — ABNORMAL HIGH (ref 0–99)
NonHDL: 214.73
Total CHOL/HDL Ratio: 5
Triglycerides: 173 mg/dL — ABNORMAL HIGH (ref 0.0–149.0)
VLDL: 34.6 mg/dL (ref 0.0–40.0)

## 2021-04-18 LAB — COMPREHENSIVE METABOLIC PANEL
ALT: 11 U/L (ref 0–35)
AST: 18 U/L (ref 0–37)
Albumin: 4 g/dL (ref 3.5–5.2)
Alkaline Phosphatase: 69 U/L (ref 39–117)
BUN: 19 mg/dL (ref 6–23)
CO2: 29 mEq/L (ref 19–32)
Calcium: 9.6 mg/dL (ref 8.4–10.5)
Chloride: 104 mEq/L (ref 96–112)
Creatinine, Ser: 1.1 mg/dL (ref 0.40–1.20)
GFR: 42.42 mL/min — ABNORMAL LOW (ref >60.00)
Glucose, Bld: 92 mg/dL (ref 70–99)
Potassium: 4.3 mEq/L (ref 3.5–5.1)
Sodium: 141 mEq/L (ref 135–145)
Total Bilirubin: 0.7 mg/dL (ref 0.2–1.2)
Total Protein: 7 g/dL (ref 6.0–8.3)

## 2021-04-18 NOTE — Assessment & Plan Note (Signed)
tolerating every other day pravastatin without statin myalgias or liver enzyme elevation   Lab Results  Component Value Date   CHOL 226 (H) 10/11/2020   HDL 55.20 10/11/2020   LDLCALC 143 (H) 10/11/2020   LDLDIRECT 173.0 02/05/2020   TRIG 142.0 10/11/2020   CHOLHDL 4 10/11/2020   Lab Results  Component Value Date   ALT 13 10/11/2020   AST 19 10/11/2020   ALKPHOS 72 10/11/2020   BILITOT 0.7 10/11/2020

## 2021-04-18 NOTE — Assessment & Plan Note (Signed)
Renal function is at  baseline with avoidance of NSAIDs.  She is on an ARB for control of hypertension,, and statin for control of hyperlipidemia.

## 2021-04-18 NOTE — Progress Notes (Signed)
Patient ID: [name], female    DOB: January 04, 1923  Age: 87 y.o. MRN: [id number]  The patient is here for follow up and management of other chronic and acute problems.  This visit occurred during the SARS-CoV-2 public health emergency.  Safety protocols were in place, including screening questions prior to the visit, additional usage of staff PPE, and extensive cleaning of exam room while observing appropriate contact time as indicated for disinfecting solutions.     The risk factors are reflected in the social history.  The roster of all physicians providing medical care to patient - is listed in the Snapshot section of the chart.  Activities of daily living:  The patient is 100% independent in all ADLs: dressing, toileting, feeding as well as independent mobility.  She is wearing a Life Alert at all times, and has not had any falls   Home safety : The patient has smoke detectors in the home. They wear seatbelts.  There are no firearms at home. There is no violence in the home.   There is no risks for hepatitis, STDs or HIV. There is no   history of blood transfusion. They have no travel history to infectious disease endemic areas of the world.  The patient has seen their dentist in the last six month. They have seen their eye doctor in the last year. They admit to slight hearing difficulty with regard to whispered voices and some television programs.  They have deferred audiologic testing in the last year.  They do not  have excessive sun exposure. Discussed the need for sun protection: hats, long sleeves and use of sunscreen if there is significant sun exposure.   Diet: the importance of a healthy diet is discussed. They do have a healthy diet.  The benefits of regular aerobic exercise were discussed. She walks 4 times per week ,  20 minutes.   Depression screen: there are no signs or vegative symptoms of depression- irritability, change in appetite, anhedonia,  sadness/tearfullness.  Cognitive assessment: the patient manages all their financial and personal affairs and is actively engaged. They could relate day,date,year and events; recalled 2/3 objects at 3 minutes; performed clock-face test normally.  The following portions of the patient's history were reviewed and updated as appropriate: allergies, current medications, past family history, past medical history,  past surgical history, past social history  and problem list.  Visual acuity was not assessed per patient preference since she has regular follow up with her ophthalmologist. Hearing and body mass index were assessed and reviewed.   During the course of the visit the patient was educated and counseled about appropriate screening and preventive services including : fall prevention , diabetes screening, nutrition counseling, colorectal cancer screening, and recommended immunizations.    CC: The primary encounter diagnosis was Stage 3a chronic kidney disease ([place]). Diagnoses of Hyperlipidemia, mixed and Chronic systolic heart failure ([place]) were also pertinent to this visit.  1) HTN:  Hypertension: patient checks blood pressure twice weekly at home.  Readings have been for the most part < 140/80 at rest . Patient is following a reduce salt diet most days and is taking medications as prescribed   2) She continues to decline all screenings due to age.   3) she has lost 2 dear friends this year .  Although she misses their company, she is not grieving. .  She has accepted her own impending mortality and does not fear death because she is quite sure she will join her  family in [place] 4) nonischemic cardiomyopathy:  she remains asymptomatic at rest  with ADLS and with leisurely walking.  History Edita has a past medical history of Carotid artery disease ([place]), Coronary artery disease, non-occlusive, Hyperlipidemia, Hypertension, Left bundle branch block, and Nonischemic cardiomyopathy ([place]).    She has a past surgical history that includes left ankle Abigail Miyamoto 2012); Abdominal hysterectomy; ankle; Cardiac catheterization; left heart catheterization with coronary angiogram (N/A, 10/31/2012); and Breast biopsy (Bilateral, YRS AGO).   Her family history includes Cancer in her father; Heart attack in her brother and mother; Hypertension in her father and mother; Stroke in her sister.She reports that she has never smoked. She has never used smokeless tobacco. She reports that she does not drink alcohol and does not use drugs.  Outpatient Medications Prior to Visit  Medication Sig Dispense Refill   amLODipine (NORVASC) 5 MG tablet TAKE ONE TABLET BY MOUTH TWICE DAILY 180 tablet 0   Calcium Carbonate-Vitamin D 600-200 MG-UNIT TABS Take by mouth.       clopidogrel (PLAVIX) 75 MG tablet TAKE 1 TABLET BY MOUTH DAILY 90 tablet 3   fish oil-omega-3 fatty acids 1000 MG capsule Take by mouth daily.       latanoprost (XALATAN) 0.005 % ophthalmic solution daily.     metoprolol succinate (TOPROL-XL) 25 MG 24 hr tablet TAKE 1 AND ONE HALF TABLET BY MOUTH DAILY. 135 tablet 0   Multiple Vitamin (MULTIVITAMIN) tablet Take 1 tablet by mouth daily.       Multiple Vitamins-Minerals (EYE VITAMINS PO) Take 1 tablet by mouth daily.     pravastatin (PRAVACHOL) 20 MG tablet TAKE 1 TABLET BY MOUTH DAILY 90 tablet 0   telmisartan (MICARDIS) 80 MG tablet TAKE 1 TABLET BY MOUTH NIGHTLY 90 tablet 1   No facility-administered medications prior to visit.    Review of Systems  Patient denies headache, fevers, malaise, unintentional weight loss, skin rash, eye pain, sinus congestion and sinus pain, sore throat, dysphagia,  hemoptysis , cough, dyspnea, wheezing, chest pain, palpitations, orthopnea, edema, abdominal pain, nausea, melena, diarrhea, constipation, flank pain, dysuria, hematuria, urinary  Frequency, nocturia, numbness, tingling, seizures,  Focal weakness, Loss of consciousness,  Tremor, insomnia, depression,  anxiety, and suicidal ideation.     Objective:  BP 128/64 (BP Location: Left Arm, Patient Position: Sitting, Cuff Size: Normal)   Pulse (!) 55   Temp (!) 96.2 F (35.7 C) (Temporal)   Ht 5\' 1"  (1.549 m)   Wt 132 lb 3.2 oz (60 kg)   SpO2 95%   BMI 24.98 kg/m   Physical Exam  General appearance: alert, cooperative and appears stated age Ears: normal TM's and external ear canals both ears Throat: lips, mucosa, and tongue normal; teeth and gums normal Neck: no adenopathy, no carotid bruit, supple, symmetrical, trachea midline and thyroid not enlarged, symmetric, no tenderness/mass/nodules Back: symmetric, no curvature. ROM normal. No CVA tenderness. Lungs: clear to auscultation bilaterally Heart: regular rate and rhythm, S1, S2 normal, no murmur, click, rub or gallop Abdomen: soft, non-tender; bowel sounds normal; no masses,  no organomegaly Pulses: 2+ and symmetric Skin: Skin color, texture, turgor normal. No rashes or lesions Lymph nodes: Cervical, supraclavicular, and axillary nodes normal.   Assessment & Plan:   Problem List Items Addressed This Visit     Chronic systolic heart failure ([place])    She follows up semi annually and  remains asymptomatic today.  Walking daily for exercise.  No edema.       CKD (chronic  kidney disease) stage 3, GFR 30-59 ml/min (HCC) - Primary    Renal function is at  baseline with avoidance of NSAIDs.  She is on an ARB for control of hypertension,, and statin for control of hyperlipidemia.       Relevant Orders   Comprehensive metabolic panel   Hyperlipidemia, mixed    tolerating every other day pravastatin without statin myalgias or liver enzyme elevation   Lab Results  Component Value Date   CHOL 226 (H) 10/11/2020   HDL 55.20 10/11/2020   LDLCALC 143 (H) 10/11/2020   LDLDIRECT 173.0 02/05/2020   TRIG 142.0 10/11/2020   CHOLHDL 4 10/11/2020   Lab Results  Component Value Date   ALT 13 10/11/2020   AST 19 10/11/2020   ALKPHOS 72  10/11/2020   BILITOT 0.7 10/11/2020          Relevant Orders   Lipid panel   A total of 40 minutes was spent with patient more than half of which was spent in counseling patient on the above mentioned issues , reviewing and explaining recent labs and previous ECHO and imaging studies done, and coordination of care.   There are no discontinued medications.  Follow-up: No follow-ups on file.   Crecencio Mc, MD

## 2021-04-18 NOTE — Assessment & Plan Note (Addendum)
She follows up semi annually and  remains asymptomatic today.  Walking daily for exercise.  No edema.

## 2021-04-25 ENCOUNTER — Telehealth: Payer: Self-pay

## 2021-04-25 NOTE — Telephone Encounter (Signed)
-----   Message from Crecencio Mc, MD sent at 04/23/2021  2:03 PM EDT ----- your kidney function is slightly low but stable and has not changed over the past 2 years.  Continue to avoid using motrin and aleve on a regular basis as theses can harm kidney function with regular use.   Cholesterol has risen on low dose pravastatin .  Is she  willing to increase dose to 40 mg daily or switch to crestor every other day ?

## 2021-05-01 DIAGNOSIS — Z23 Encounter for immunization: Secondary | ICD-10-CM | POA: Diagnosis not present

## 2021-06-01 ENCOUNTER — Other Ambulatory Visit: Payer: Self-pay | Admitting: Physician Assistant

## 2021-06-01 DIAGNOSIS — H2513 Age-related nuclear cataract, bilateral: Secondary | ICD-10-CM | POA: Diagnosis not present

## 2021-06-15 ENCOUNTER — Other Ambulatory Visit: Payer: Self-pay | Admitting: Internal Medicine

## 2021-08-10 ENCOUNTER — Other Ambulatory Visit: Payer: Self-pay | Admitting: Internal Medicine

## 2021-08-24 ENCOUNTER — Other Ambulatory Visit: Payer: Self-pay | Admitting: Cardiovascular Disease

## 2021-09-14 ENCOUNTER — Other Ambulatory Visit: Payer: Self-pay | Admitting: Internal Medicine

## 2021-10-26 ENCOUNTER — Other Ambulatory Visit: Payer: Self-pay | Admitting: Internal Medicine

## 2021-11-09 ENCOUNTER — Other Ambulatory Visit: Payer: Self-pay | Admitting: Internal Medicine

## 2021-11-21 ENCOUNTER — Other Ambulatory Visit: Payer: Self-pay | Admitting: Cardiovascular Disease

## 2021-11-29 DIAGNOSIS — H40003 Preglaucoma, unspecified, bilateral: Secondary | ICD-10-CM | POA: Diagnosis not present

## 2021-12-07 ENCOUNTER — Other Ambulatory Visit: Payer: Self-pay | Admitting: Internal Medicine

## 2022-01-18 ENCOUNTER — Other Ambulatory Visit: Payer: Self-pay | Admitting: Internal Medicine

## 2022-03-08 ENCOUNTER — Other Ambulatory Visit: Payer: Self-pay | Admitting: Internal Medicine

## 2022-03-08 DIAGNOSIS — H6123 Impacted cerumen, bilateral: Secondary | ICD-10-CM | POA: Diagnosis not present

## 2022-03-08 DIAGNOSIS — H93293 Other abnormal auditory perceptions, bilateral: Secondary | ICD-10-CM | POA: Diagnosis not present

## 2022-03-15 DIAGNOSIS — Z23 Encounter for immunization: Secondary | ICD-10-CM | POA: Diagnosis not present

## 2022-04-06 DIAGNOSIS — Z23 Encounter for immunization: Secondary | ICD-10-CM | POA: Diagnosis not present

## 2022-04-10 ENCOUNTER — Ambulatory Visit (INDEPENDENT_AMBULATORY_CARE_PROVIDER_SITE_OTHER): Payer: Medicare Other

## 2022-04-10 VITALS — Ht 61.0 in | Wt 132.0 lb

## 2022-04-10 DIAGNOSIS — Z0000 Encounter for general adult medical examination without abnormal findings: Secondary | ICD-10-CM

## 2022-04-10 NOTE — Patient Instructions (Addendum)
[name] , Thank you for taking time to come for your Medicare Wellness Visit. I appreciate your ongoing commitment to your health goals. Please review the following plan we discussed and let me know if I can assist you in the future.   These are the goals we discussed:  Goals       Patient Stated     Weight (lb) < 121 lb (54.9 kg) (pt-stated)      Reduce sugar intake;portion control Walk more for exercise, as tolerated        This is a list of the screening recommended for you and due dates:  Health Maintenance  Topic Date Due   Hemoglobin A1C  04/19/2022*   COVID-19 Vaccine (5 - Pfizer risk series) 06/01/2022   Tetanus Vaccine  04/02/2023   Pneumonia Vaccine  Completed   Flu Shot  Completed   Zoster (Shingles) Vaccine  Completed   HPV Vaccine  Aged Out   DEXA scan (bone density measurement)  Discontinued  *Topic was postponed. The date shown is not the original due date.    Advanced directives: End of life planning; Advance aging; Advanced directives discussed.  Copy of current HCPOA/Living Will requested.   minutes.   Conditions/risks identified: none new  Next appointment: Follow up in one year for your annual wellness visit   Preventive Care 65 Years and Older, Female Preventive care refers to lifestyle choices and visits with your health care provider that can promote health and wellness. What does preventive care include? A yearly physical exam. This is also called an annual well check. Dental exams once or twice a year. Routine eye exams. Ask your health care provider how often you should have your eyes checked. Personal lifestyle choices, including: Daily care of your teeth and gums. Regular physical activity. Eating a healthy diet. Avoiding tobacco and drug use. Limiting alcohol use. Practicing safe sex. Taking low-dose aspirin every day. Taking vitamin and mineral supplements as recommended by your health care provider. What happens during an annual well  check? The services and screenings done by your health care provider during your annual well check will depend on your age, overall health, lifestyle risk factors, and family history of disease. Counseling  Your health care provider may ask you questions about your: Alcohol use. Tobacco use. Drug use. Emotional well-being. Home and relationship well-being. Sexual activity. Eating habits. History of falls. Memory and ability to understand (cognition). Work and work Statistician. Reproductive health. Screening  You may have the following tests or measurements: Height, weight, and BMI. Blood pressure. Lipid and cholesterol levels. These may be checked every 5 years, or more frequently if you are over 67 years old. Skin check. Lung cancer screening. You may have this screening every year starting at age 33 if you have a 30-pack-year history of smoking and currently smoke or have quit within the past 15 years. Fecal occult blood test (FOBT) of the stool. You may have this test every year starting at age 60. Flexible sigmoidoscopy or colonoscopy. You may have a sigmoidoscopy every 5 years or a colonoscopy every 10 years starting at age 91. Hepatitis C blood test. Hepatitis B blood test. Sexually transmitted disease (STD) testing. Diabetes screening. This is done by checking your blood sugar (glucose) after you have not eaten for a while (fasting). You may have this done every 1-3 years. Bone density scan. This is done to screen for osteoporosis. You may have this done starting at age 61. Mammogram. This may be  done every 1-2 years. Talk to your health care provider about how often you should have regular mammograms. Talk with your health care provider about your test results, treatment options, and if necessary, the need for more tests. Vaccines  Your health care provider may recommend certain vaccines, such as: Influenza vaccine. This is recommended every year. Tetanus, diphtheria, and  acellular pertussis (Tdap, Td) vaccine. You may need a Td booster every 10 years. Zoster vaccine. You may need this after age 54. Pneumococcal 13-valent conjugate (PCV13) vaccine. One dose is recommended after age 30. Pneumococcal polysaccharide (PPSV23) vaccine. One dose is recommended after age 72. Talk to your health care provider about which screenings and vaccines you need and how often you need them. This information is not intended to replace advice given to you by your health care provider. Make sure you discuss any questions you have with your health care provider. Document Released: 07/02/2015 Document Revised: 02/23/2016 Document Reviewed: 04/06/2015 Elsevier Interactive Patient Education  2017 [place] Prevention in the Home Falls can cause injuries. They can happen to people of all ages. There are many things you can do to make your home safe and to help prevent falls. What can I do on the outside of my home? Regularly fix the edges of walkways and driveways and fix any cracks. Remove anything that might make you trip as you walk through a door, such as a raised step or threshold. Trim any bushes or trees on the path to your home. Use bright outdoor lighting. Clear any walking paths of anything that might make someone trip, such as rocks or tools. Regularly check to see if handrails are loose or broken. Make sure that both sides of any steps have handrails. Any raised decks and porches should have guardrails on the edges. Have any leaves, snow, or ice cleared regularly. Use sand or salt on walking paths during winter. Clean up any spills in your garage right away. This includes oil or grease spills. What can I do in the bathroom? Use night lights. Install grab bars by the toilet and in the tub and shower. Do not use towel bars as grab bars. Use non-skid mats or decals in the tub or shower. If you need to sit down in the shower, use a plastic, non-slip stool. Keep  the floor dry. Clean up any water that spills on the floor as soon as it happens. Remove soap buildup in the tub or shower regularly. Attach bath mats securely with double-sided non-slip rug tape. Do not have throw rugs and other things on the floor that can make you trip. What can I do in the bedroom? Use night lights. Make sure that you have a light by your bed that is easy to reach. Do not use any sheets or blankets that are too big for your bed. They should not hang down onto the floor. Have a firm chair that has side arms. You can use this for support while you get dressed. Do not have throw rugs and other things on the floor that can make you trip. What can I do in the kitchen? Clean up any spills right away. Avoid walking on wet floors. Keep items that you use a lot in easy-to-reach places. If you need to reach something above you, use a strong step stool that has a grab bar. Keep electrical cords out of the way. Do not use floor polish or wax that makes floors slippery. If you must use wax,  use non-skid floor wax. Do not have throw rugs and other things on the floor that can make you trip. What can I do with my stairs? Do not leave any items on the stairs. Make sure that there are handrails on both sides of the stairs and use them. Fix handrails that are broken or loose. Make sure that handrails are as long as the stairways. Check any carpeting to make sure that it is firmly attached to the stairs. Fix any carpet that is loose or worn. Avoid having throw rugs at the top or bottom of the stairs. If you do have throw rugs, attach them to the floor with carpet tape. Make sure that you have a light switch at the top of the stairs and the bottom of the stairs. If you do not have them, ask someone to add them for you. What else can I do to help prevent falls? Wear shoes that: Do not have high heels. Have rubber bottoms. Are comfortable and fit you well. Are closed at the toe. Do not  wear sandals. If you use a stepladder: Make sure that it is fully opened. Do not climb a closed stepladder. Make sure that both sides of the stepladder are locked into place. Ask someone to hold it for you, if possible. Clearly mark and make sure that you can see: Any grab bars or handrails. First and last steps. Where the edge of each step is. Use tools that help you move around (mobility aids) if they are needed. These include: Canes. Walkers. Scooters. Crutches. Turn on the lights when you go into a dark area. Replace any light bulbs as soon as they burn out. Set up your furniture so you have a clear path. Avoid moving your furniture around. If any of your floors are uneven, fix them. If there are any pets around you, be aware of where they are. Review your medicines with your doctor. Some medicines can make you feel dizzy. This can increase your chance of falling. Ask your doctor what other things that you can do to help prevent falls. This information is not intended to replace advice given to you by your health care provider. Make sure you discuss any questions you have with your health care provider. Document Released: 04/01/2009 Document Revised: 11/11/2015 Document Reviewed: 07/10/2014 Elsevier Interactive Patient Education  2017 [employer].

## 2022-04-10 NOTE — Progress Notes (Addendum)
Subjective:   [name] is a 88 y.o. female who presents for Medicare Annual (Subsequent) preventive examination.  Review of Systems    No ROS.  Medicare Wellness Virtual Visit.  Visual/audio telehealth visit, UTA vital signs.   See social history for additional risk factors.   Cardiac Risk Factors include: advanced age (>45mn, >>78women);hypertension     Objective:    Today's Vitals   04/10/22 0848  Weight: 132 lb (59.9 kg)  Height: '5\' 1"'$  (1.549 m)   Body mass index is 24.94 kg/m.     04/10/2022    9:15 AM 04/08/2021    9:16 AM 04/07/2020    9:13 AM 04/07/2019    9:24 AM 04/03/2018    9:15 AM 04/02/2017    9:24 AM 03/31/2016    9:09 AM  Advanced Directives  Does Patient Have a Medical Advance Directive? Yes Yes Yes Yes Yes Yes Yes  Type of AParamedicof AL[place] FallsLiving will [place]Living will [place]Living will Healthcare Power of AO[place]Living will HFrankfortLiving will HR[place]Living will  Does patient want to make changes to medical advance directive? No - Patient declined No - Patient declined No - Patient declined No - Patient declined No - Patient declined No - Patient declined   Copy of [place] Chart? No - copy requested No - copy requested No - copy requested No - copy requested No - copy requested No - copy requested No - copy requested    Current Medications (verified) Outpatient Encounter Medications as of 04/10/2022  Medication Sig   amLODipine (NORVASC) 5 MG tablet TAKE ONE TABLET BY MOUTH TWICE DAILY   Calcium Carbonate-Vitamin D 600-200 MG-UNIT TABS Take by mouth.     clopidogrel (PLAVIX) 75 MG tablet TAKE 1 TABLET BY MOUTH DAILY   fish oil-omega-3 fatty acids 1000 MG capsule Take by mouth daily.     latanoprost (XALATAN) 0.005 % ophthalmic solution daily.   metoprolol succinate  (TOPROL-XL) 25 MG 24 hr tablet TAKE ONE AND ONE-HALF TABLET BY MOUTH EVERY DAY   Multiple Vitamin (MULTIVITAMIN) tablet Take 1 tablet by mouth daily.     Multiple Vitamins-Minerals (EYE VITAMINS PO) Take 1 tablet by mouth daily.   pravastatin (PRAVACHOL) 20 MG tablet TAKE 1 TABLET BY MOUTH DAILY   telmisartan (MICARDIS) 80 MG tablet TAKE ONE TABLET EACH NIGHT   No facility-administered encounter medications on file as of 04/10/2022.    Allergies (verified) Sulfa antibiotics and Contrast media [iodinated contrast media]   History: Past Medical History:  Diagnosis Date   Carotid artery disease ([place]    Coronary artery disease, non-occlusive    Hyperlipidemia    Hypertension    Left bundle branch block    Nonischemic cardiomyopathy ([place]    Past Surgical History:  Procedure Laterality Date   ABDOMINAL HYSTERECTOMY     ankle     rods in both ankles   BREAST BIOPSY Bilateral YRS AGO   NEG   CARDIAC CATHETERIZATION     [facility]   left ankle  Octo 2012   s/p pinning , [place]   LEFT HEART CATHETERIZATION WITH CORONARY ANGIOGRAM N/A 10/31/2012   Procedure: LEFT HEART CATHETERIZATION WITH CORONARY ANGIOGRAM;  Surgeon: MClent Demark MD;  [facility];  Service: Cardiovascular;  Laterality: N/A;   Family History  Problem Relation Age of Onset   Hypertension Mother  Heart attack Mother    Hypertension Father    Cancer Father        stomach   Heart attack Brother    Stroke Sister    Breast cancer Neg Hx    Social History   Socioeconomic History   Marital status: Widowed    Spouse name: Not on file   Number of children: Not on file   Years of education: Not on file   Highest education level: Not on file  Occupational History   Not on file  Tobacco Use   Smoking status: Never   Smokeless tobacco: Never  Vaping Use   Vaping Use: Never used  Substance and Sexual Activity   Alcohol use: No   Drug use: No   Sexual activity: Not Currently  Other Topics Concern    Not on file  Social History Narrative   Not on file   Social Determinants of Health   Financial Resource Strain: Low Risk  (04/10/2022)   Overall Financial Resource Strain (CARDIA)    Difficulty of Paying Living Expenses: Not hard at all  Food Insecurity: No Food Insecurity (04/10/2022)   Hunger Vital Sign    Worried About Running Out of Food in the Last Year: Never true    Ran Out of Food in the Last Year: Never true  Transportation Needs: No Transportation Needs (04/10/2022)   PRAPARE - Hydrologist (Medical): No    Lack of Transportation (Non-Medical): No  Physical Activity: Unknown (04/10/2022)   Exercise Vital Sign    Days of Exercise per Week: 0 days    Minutes of Exercise per Session: Not on file  Stress: No Stress Concern Present (04/10/2022)   [place]    Feeling of Stress : Not at all  Social Connections: Moderately Integrated (04/10/2022)   Social Connection and Isolation Panel [NHANES]    Frequency of Communication with Friends and Family: Twice a week    Frequency of Social Gatherings with Friends and Family: Twice a week    Attends Religious Services: More than 4 times per year    Active Member of [employer] or Organizations: Yes    Attends Archivist Meetings: Not on file    Marital Status: Widowed    Tobacco Counseling Counseling given: Not Answered   Clinical Intake:  Pre-visit preparation completed: Yes        Diabetes: No  How often do you need to have someone help you when you read instructions, pamphlets, or other written materials from your doctor or pharmacy?: 1 - Never    Interpreter Needed?: No      Activities of Daily Living    04/10/2022    9:16 AM  In your present state of health, do you have any difficulty performing the following activities:  Hearing? 0  Vision? 0  Difficulty concentrating or making decisions? 0  Walking  or climbing stairs? 0  Dressing or bathing? 0  Doing errands, shopping? 0  Preparing Food and eating ? N  Using the Toilet? N  In the past six months, have you accidently leaked urine? N  Do you have problems with loss of bowel control? N  Managing your Medications? N  Managing your Finances? N  Housekeeping or managing your Housekeeping? Y  Comment Maid assist    Patient Care Team: Crecencio Mc, MD as PCP - General (Internal Medicine)  Indicate any recent Medical Services you may  have received from other than [facility] providers in the past year (date may be approximate).     Assessment:   This is a routine wellness examination for [name].  I connected with  [name] on 04/10/22 by a audio enabled telemedicine application and verified that I am speaking with the correct person using two identifiers.  Patient Location: Home  Provider Location: Office/Clinic  I discussed the limitations of evaluation and management by telemedicine. The patient expressed understanding and agreed to proceed.   Hearing/Vision screen Hearing Screening - Comments:: Patient is able to hear conversational tones without difficulty. No issues reported.  Vision Screening - Comments:: Followed by [facility] Wears corrective lenses  They have regular follow up with the ophthalmologist  Dietary issues and exercise activities discussed: Intensity: Mild   Goals Addressed               This Visit's Progress     Patient Stated     Weight (lb) < 121 lb (54.9 kg) (pt-stated)   132 lb (59.9 kg)     Reduce sugar intake;portion control Walk more for exercise, as tolerated       Depression Screen    04/10/2022    8:50 AM 04/18/2021    8:10 AM 04/08/2021    9:17 AM 04/07/2020    9:11 AM 04/07/2019    9:13 AM 04/03/2018    9:16 AM 04/02/2017    9:17 AM  PHQ 2/9 Scores  PHQ - 2 Score 0 0 0 0 0 0 0  PHQ- 9 Score       2    Fall Risk    04/10/2022    8:50 AM 04/18/2021     8:10 AM 04/08/2021    9:17 AM 04/09/2020   10:37 AM 04/07/2020    9:14 AM  [place] in the past year? 0 0 0 0 0  Number falls in past yr: 0  0  0  Injury with Fall? 0      Risk for fall due to : No Fall Risks No Fall Risks     Follow up Falls evaluation completed Falls evaluation completed Falls evaluation completed Falls evaluation completed Falls evaluation completed    [place]: Home free of loose throw rugs in walkways, pet beds, electrical cords, etc? Yes  Adequate lighting in your home to reduce risk of falls? Yes   ASSISTIVE DEVICES UTILIZED TO PREVENT FALLS: Life alert? Yes  Use of a cane, walker or w/c? Yes  Grab bars in the bathroom? Yes  Shower chair or bench in shower? Yes  Elevated toilet seat or a handicapped toilet? Yes   TIMED UP AND GO: Was the test performed? Yes .   Cognitive Function:    03/31/2016    9:19 AM 03/31/2015   10:02 AM  MMSE - Mini Mental State Exam  Orientation to time 5 5  Orientation to Place 5 5  Registration 3 3  Attention/ Calculation 5 5  Recall 3 3  Language- name 2 objects 2 2  Language- repeat 1 1  Language- follow 3 step command 3 3  Language- read & follow direction 1 1  Write a sentence 1 1  Copy design 1 1  Total score 30 30        04/10/2022    9:16 AM 04/07/2019    9:19 AM 04/03/2018    9:17 AM 04/02/2017    9:31 AM  6CIT Screen  What Year? 0 points 0 points 0 points 0 points  What month? 0 points 0 points 0 points 0 points  What time? 0 points 0 points 0 points 0 points  Count back from 20 0 points 0 points 0 points 0 points  Months in reverse 0 points 0 points 0 points 2 points  Repeat phrase 0 points  0 points 0 points  Total Score 0 points  0 points 2 points    Immunizations Immunization History  Administered Date(s) Administered   Fluad Quad(high Dose 65+) 04/06/2021   Influenza Split 03/20/2011, 03/13/2012, 03/09/2014   Influenza, High Dose Seasonal PF  04/03/2018, 01/30/2019, 04/06/2021, 03/15/2022   Influenza,inj,Quad PF,6+ Mos 03/31/2015   Influenza-Unspecified 03/14/2013, 03/07/2014, 03/20/2016, 03/19/2017, 04/02/2020   PFIZER(Purple Top)SARS-COV-2 Vaccination 07/05/2019, 07/26/2019, 03/19/2020, 04/06/2022   Pneumococcal Conjugate-13 03/23/2014   Pneumococcal Polysaccharide-23 03/19/2010, 04/15/2019   Tdap 04/01/2013   Zoster Recombinat (Shingrix) 01/26/2018, 04/19/2018, 07/30/2018   Zoster, Live 03/20/2011   Covid vaccine- completed x4.  Screening Tests Health Maintenance  Topic Date Due   HEMOGLOBIN A1C  04/19/2022 (Originally 1923-04-12)   COVID-19 Vaccine (5 - Pfizer risk series) 06/01/2022   TETANUS/TDAP  04/02/2023   Pneumonia Vaccine 45+ Years old  Completed   INFLUENZA VACCINE  Completed   Zoster Vaccines- Shingrix  Completed   HPV VACCINES  Aged Out   DEXA SCAN  Discontinued    Health Maintenance There are no preventive care reminders to display for this patient.  Lung Cancer Screening: (Low Dose CT Chest recommended if Age 49-80 years, 30 pack-year currently smoking OR have quit w/in 15years.) does not qualify.   Hepatitis C Screening: does not qualify.  Vision Screening: Recommended annual ophthalmology exams for early detection of glaucoma and other disorders of the eye.  Dental Screening: Recommended annual dental exams for proper oral hygiene.  Community Resource Referral / Chronic Care Management: CRR required this visit?  No   CCM required this visit?  No      Plan:   Update immunization record once RSV vaccine completed 04/14/22.  I have personally reviewed and noted the following in the patient's chart:   Medical and social history Use of alcohol, tobacco or illicit drugs  Current medications and supplements including opioid prescriptions. Patient is not currently taking opioid prescriptions. Functional ability and status Nutritional status Physical activity Advanced directives List of  other physicians Hospitalizations, surgeries, and ER visits in previous 12 months Vitals Screenings to include cognitive, depression, and falls Referrals and appointments  In addition, I have reviewed and discussed with patient certain preventive protocols, quality metrics, and best practice recommendations. A written personalized care plan for preventive services as well as general preventive health recommendations were provided to patient.     [place], LPN   [id number]     I have reviewed the above information and agree with above.   Deborra Medina, MD

## 2022-04-19 ENCOUNTER — Ambulatory Visit (INDEPENDENT_AMBULATORY_CARE_PROVIDER_SITE_OTHER): Payer: Medicare Other | Admitting: Internal Medicine

## 2022-04-19 ENCOUNTER — Encounter: Payer: Self-pay | Admitting: Internal Medicine

## 2022-04-19 VITALS — BP 132/68 | HR 60 | Temp 97.6°F | Ht 61.0 in | Wt 132.2 lb

## 2022-04-19 DIAGNOSIS — I10 Essential (primary) hypertension: Secondary | ICD-10-CM | POA: Diagnosis not present

## 2022-04-19 DIAGNOSIS — R5383 Other fatigue: Secondary | ICD-10-CM

## 2022-04-19 DIAGNOSIS — Z8781 Personal history of (healed) traumatic fracture: Secondary | ICD-10-CM | POA: Diagnosis not present

## 2022-04-19 DIAGNOSIS — E782 Mixed hyperlipidemia: Secondary | ICD-10-CM | POA: Diagnosis not present

## 2022-04-19 DIAGNOSIS — N1832 Chronic kidney disease, stage 3b: Secondary | ICD-10-CM

## 2022-04-19 DIAGNOSIS — R7301 Impaired fasting glucose: Secondary | ICD-10-CM

## 2022-04-19 LAB — COMPREHENSIVE METABOLIC PANEL
ALT: 11 U/L (ref 0–35)
AST: 20 U/L (ref 0–37)
Albumin: 4.2 g/dL (ref 3.5–5.2)
Alkaline Phosphatase: 64 U/L (ref 39–117)
BUN: 22 mg/dL (ref 6–23)
CO2: 28 mEq/L (ref 19–32)
Calcium: 10.2 mg/dL (ref 8.4–10.5)
Chloride: 103 mEq/L (ref 96–112)
Creatinine, Ser: 1.13 mg/dL (ref 0.40–1.20)
GFR: 40.79 mL/min — ABNORMAL LOW (ref >60.00)
Glucose, Bld: 91 mg/dL (ref 70–99)
Potassium: 3.9 mEq/L (ref 3.5–5.1)
Sodium: 139 mEq/L (ref 135–145)
Total Bilirubin: 0.8 mg/dL (ref 0.2–1.2)
Total Protein: 7.2 g/dL (ref 6.0–8.3)

## 2022-04-19 LAB — CBC WITH DIFFERENTIAL/PLATELET
Basophils Absolute: 0 10*3/uL (ref 0.0–0.1)
Basophils Relative: 0.5 % (ref 0.0–3.0)
Eosinophils Absolute: 0.1 10*3/uL (ref 0.0–0.7)
Eosinophils Relative: 1.8 % (ref 0.0–5.0)
HCT: 45.1 % (ref 36.0–46.0)
Hemoglobin: 15.1 g/dL — ABNORMAL HIGH (ref 12.0–15.0)
Lymphocytes Relative: 16.6 % (ref 12.0–46.0)
Lymphs Abs: 1.3 10*3/uL (ref 0.7–4.0)
MCHC: 33.4 g/dL (ref 30.0–36.0)
MCV: 95.3 fl (ref 78.0–100.0)
Monocytes Absolute: 0.6 10*3/uL (ref 0.1–1.0)
Monocytes Relative: 7.7 % (ref 3.0–12.0)
Neutro Abs: 5.7 10*3/uL (ref 1.4–7.7)
Neutrophils Relative %: 73.4 % (ref 43.0–77.0)
Platelets: 202 10*3/uL (ref 150.0–400.0)
RBC: 4.73 Mil/uL (ref 3.87–5.11)
RDW: 14 % (ref 11.5–15.5)
WBC: 7.8 10*3/uL (ref 4.0–10.5)

## 2022-04-19 LAB — LIPID PANEL
Cholesterol: 270 mg/dL — ABNORMAL HIGH (ref 0–200)
HDL: 54.8 mg/dL (ref >39.00)
LDL Cholesterol: 184 mg/dL — ABNORMAL HIGH (ref 0–99)
NonHDL: 215.05
Total CHOL/HDL Ratio: 5
Triglycerides: 157 mg/dL — ABNORMAL HIGH (ref 0.0–149.0)
VLDL: 31.4 mg/dL (ref 0.0–40.0)

## 2022-04-19 LAB — LDL CHOLESTEROL, DIRECT: Direct LDL: 178 mg/dL

## 2022-04-19 LAB — MICROALBUMIN / CREATININE URINE RATIO
Creatinine,U: 134.2 mg/dL
Microalb Creat Ratio: 1.6 mg/g (ref 0.0–30.0)
Microalb, Ur: 2.1 mg/dL — ABNORMAL HIGH (ref 0.0–1.9)

## 2022-04-19 LAB — TSH: TSH: 4.04 u[IU]/mL (ref 0.35–5.50)

## 2022-04-19 LAB — HEMOGLOBIN A1C: Hgb A1c MFr Bld: 5.9 % (ref 4.6–6.5)

## 2022-04-19 MED ORDER — AMLODIPINE BESYLATE 5 MG PO TABS: 5.0000 mg | ORAL_TABLET | Freq: Two times a day (BID) | ORAL | 2 refills | Status: DC

## 2022-04-19 NOTE — Progress Notes (Signed)
Patient ID: [name], female    DOB: 10-05-1922  Age: 88 y.o. MRN: [id number]  The patient is here for follow up and  management of other chronic and acute problems.   The risk factors are reflected in the social history.  The roster of all physicians providing medical care to patient - is listed in the Snapshot section of the chart.  Activities of daily living:  The patient is 100% independent in all ADLs: dressing, toileting, feeding as well as independent mobility  Home safety : The patient has smoke detectors in the home. They wear seatbelts.  There are no firearms at home. There is no violence in the home.   There is no risks for hepatitis, STDs or HIV. There is no   history of blood transfusion. They have no travel history to infectious disease endemic areas of the world.  The patient has seen their dentist in the last six month. They have seen their eye doctor in the last year. They admit to slight hearing difficulty with regard to whispered voices and some television programs.  They have deferred audiologic testing in the last year.  They do not  have excessive sun exposure. Discussed the need for sun protection: hats, long sleeves and use of sunscreen if there is significant sun exposure.   Diet: the importance of a healthy diet is discussed. They do have a healthy diet.  The benefits of regular aerobic exercise were discussed. She has left ankle OA , secondary to remote fracture requiring pinning    Depression screen: there are no signs or vegative symptoms of depression- irritability, change in appetite, anhedonia, sadness/tearfullness.  Cognitive assessment: the patient manages all their financial and personal affairs and is actively engaged. They could relate day,date,year and events; recalled 2/3 objects at 3 minutes; performed clock-face test normally.  The following portions of the patient's history were reviewed and updated as appropriate: allergies, current medications,  past family history, past medical history,  past surgical history, past social history  and problem list.  Visual acuity was not assessed per patient preference since she has regular follow up with her ophthalmologist. Hearing and body mass index were assessed and reviewed.   During the course of the visit the patient was educated and counseled about appropriate screening and preventive services including : fall prevention , diabetes screening, nutrition counseling, colorectal cancer screening, and recommended immunizations.    CC: The primary encounter diagnosis was Primary hypertension. Diagnoses of Mixed hyperlipidemia, Impaired fasting glucose, Other fatigue, History of ankle fracture, and Stage 3b chronic kidney disease ([place]) were also pertinent to this visit.  Left ankle pain : chronic, described "Not pain just hurting" .  Discourages her from walking daily.  Lives in an apartment complex with a gym and a pool but does not use either.  Does not take anything for pain   Occasional trouble sleeping  ;  reads when woken upb  HTN:  taking telmisartan 80 mg, metoprolol 37.5  mg  and amlodipine 5 mg bid.  Checks readings at home once daily.   Last reading was 119/68 yesterday at home .  Has mild fluid retention managed with compression knee highs.   Frequent urination : chronic .  Denies dysuria   History [name] has a past medical history of Carotid artery disease ([place]), Coronary artery disease, non-occlusive, Hyperlipidemia, Hypertension, Left bundle branch block, and Nonischemic cardiomyopathy ([place]).   She has a past surgical history that includes left ankle Abigail Miyamoto 2012); Abdominal  hysterectomy; ankle; Cardiac catheterization; left heart catheterization with coronary angiogram (N/A, 10/31/2012); and Breast biopsy (Bilateral, YRS AGO).   Her family history includes Cancer in her father; Heart attack in her brother and mother; Hypertension in her father and mother; Stroke in her sister.She reports  that she has never smoked. She has never used smokeless tobacco. She reports that she does not drink alcohol and does not use drugs.  Outpatient Medications Prior to Visit  Medication Sig Dispense Refill   Calcium Carbonate-Vitamin D 600-200 MG-UNIT TABS Take by mouth.       clopidogrel (PLAVIX) 75 MG tablet TAKE 1 TABLET BY MOUTH DAILY 90 tablet 3   fish oil-omega-3 fatty acids 1000 MG capsule Take by mouth daily.       latanoprost (XALATAN) 0.005 % ophthalmic solution daily.     metoprolol succinate (TOPROL-XL) 25 MG 24 hr tablet TAKE ONE AND ONE-HALF TABLET BY MOUTH EVERY DAY 135 tablet 1   Multiple Vitamin (MULTIVITAMIN) tablet Take 1 tablet by mouth daily.       Multiple Vitamins-Minerals (EYE VITAMINS PO) Take 1 tablet by mouth daily.     pravastatin (PRAVACHOL) 20 MG tablet TAKE 1 TABLET BY MOUTH DAILY 90 tablet 1   telmisartan (MICARDIS) 80 MG tablet TAKE ONE TABLET EACH NIGHT 90 tablet 1   amLODipine (NORVASC) 5 MG tablet TAKE ONE TABLET BY MOUTH TWICE DAILY 180 tablet 0   No facility-administered medications prior to visit.    Review of Systems  Patient denies headache, fevers, malaise, unintentional weight loss, skin rash, eye pain, sinus congestion and sinus pain, sore throat, dysphagia,  hemoptysis , cough, dyspnea, wheezing, chest pain, palpitations, orthopnea, edema, abdominal pain, nausea, melena, diarrhea, constipation, flank pain, dysuria, hematuria, urinary  Frequency, nocturia, numbness, tingling, seizures,  Focal weakness, Loss of consciousness,  Tremor, insomnia, depression, anxiety, and suicidal ideation.     Objective:  BP 132/68 (BP Location: Left Arm, Patient Position: Sitting, Cuff Size: Normal)   Pulse 60   Temp 97.6 F (36.4 C) (Oral)   Ht _0  (1.549 m)   Wt 132 lb 3.2 oz (60 kg)   SpO2 94%   BMI 24.98 kg/m   Physical Exam  General appearance: alert, cooperative and appears stated age Ears: normal TM's and external ear canals both ears Throat:  lips, mucosa, and tongue normal; teeth and gums normal Neck: no adenopathy, no carotid bruit, supple, symmetrical, trachea midline and thyroid not enlarged, symmetric, no tenderness/mass/nodules Back: symmetric, no curvature. ROM normal. No CVA tenderness. Lungs: clear to auscultation bilaterally Heart: regular rate and rhythm, S1, S2 normal, no murmur, click, rub or gallop Abdomen: soft, non-tender; bowel sounds normal; no masses,  no organomegaly Pulses: 2+ and symmetric Skin: Skin color, texture, turgor normal. No rashes or lesions Lymph nodes: Cervical, supraclavicular, and axillary nodes normal.    Assessment & Plan:   Problem List Items Addressed This Visit     Mixed hyperlipidemia    tolerating every other day pravastatin without statin myalgias or liver enzyme elevation   Lab Results  Component Value Date   CHOL 270 (H) 04/18/2021   HDL 55.10 04/18/2021   LDLCALC 180 (H) 04/18/2021   LDLDIRECT 173.0 02/05/2020   TRIG 173.0 (H) 04/18/2021   CHOLHDL 5 04/18/2021   Lab Results  Component Value Date   ALT 11 04/18/2021   AST 18 04/18/2021   ALKPHOS 69 04/18/2021   BILITOT 0.7 04/18/2021          Relevant Medications  amLODipine (NORVASC) 5 MG tablet   Other Relevant Orders   Lipid Profile   Direct LDL   Hypertension - Primary    Based on home readings,  She is at goal on amlodipine ,  Metoprolol and telmisartan.  No changes today      Relevant Medications   amLODipine (NORVASC) 5 MG tablet   Other Relevant Orders   Comp Met (CMET)   Urine Microalbumin w/creat. ratio   History of ankle fracture    With residual pain and stiffness discouraging her from walking for exercise . Recommended use of tylenol up to 1000 mg bid and increase activity      CKD (chronic kidney disease) stage 3, GFR 30-59 ml/min (HCC)    Renal function has been stable  with avoidance of NSAIDs.  She is on an ARB for control of hypertension,, and statin for control of hyperlipidemia.        Other Visit Diagnoses     Impaired fasting glucose       Relevant Orders   HgB A1c   Other fatigue       Relevant Orders   TSH   CBC with Differential/Platelet      I have spent a total time of 40 minutes on the day of the appointment reviewing prior documentation, counselling on appropriate exercises for mobility and strength training,reviewing prior imaging studies and labs.  And post visit ordering of therapeutics and interpretation of diagnostics  Meds ordered this encounter  Medications   amLODipine (NORVASC) 5 MG tablet    Sig: Take 1 tablet (5 mg total) by mouth 2 (two) times daily.    Dispense:  180 tablet    Refill:  2    PLEASE SEND NEW RX THANK YOU    Medications Discontinued During This Encounter  Medication Reason   amLODipine (NORVASC) 5 MG tablet Reorder    Follow-up: Return in about 6 months (around 10/18/2022).   Crecencio Mc, MD

## 2022-04-19 NOTE — Patient Instructions (Addendum)
You can safely repeat the shingles vaccine since our records do not tell us WHERE YOU GOT IT so it might be inaccurate  Try using 1000 mg of tylenol up to 2 times daily  for your ankle discomfort "  Take the tylenol 30 minutes before you exercise   Work your way up to 30 minutes of exercise daily either walking or using the recumbent bike   Use the pool during the warm months to exercise  your legs in the water (sitting on the edge)  Immerse your ankle in very warm water for 15 minutes  before you start to do your mobility exercises.  That will loosen it up

## 2022-04-19 NOTE — Assessment & Plan Note (Signed)
Renal function has been stable  with avoidance of NSAIDs.  She is on an ARB for control of hypertension,, and statin for control of hyperlipidemia.

## 2022-04-19 NOTE — Assessment & Plan Note (Signed)
Based on home readings,  She is at goal on amlodipine ,  Metoprolol and telmisartan.  No changes today

## 2022-04-19 NOTE — Assessment & Plan Note (Signed)
tolerating every other day pravastatin without statin myalgias or liver enzyme elevation   Lab Results  Component Value Date   CHOL 270 (H) 04/18/2021   HDL 55.10 04/18/2021   LDLCALC 180 (H) 04/18/2021   LDLDIRECT 173.0 02/05/2020   TRIG 173.0 (H) 04/18/2021   CHOLHDL 5 04/18/2021   Lab Results  Component Value Date   ALT 11 04/18/2021   AST 18 04/18/2021   ALKPHOS 69 04/18/2021   BILITOT 0.7 04/18/2021

## 2022-04-19 NOTE — Assessment & Plan Note (Signed)
With residual pain and stiffness discouraging her from walking for exercise . Recommended use of tylenol up to 1000 mg bid and increase activity

## 2022-05-04 ENCOUNTER — Ambulatory Visit: Payer: Medicare Other | Admitting: Cardiovascular Disease

## 2022-05-05 ENCOUNTER — Ambulatory Visit: Payer: Medicare Other | Attending: Cardiovascular Disease | Admitting: Cardiovascular Disease

## 2022-05-05 ENCOUNTER — Encounter: Payer: Self-pay | Admitting: Cardiovascular Disease

## 2022-05-05 VITALS — BP 110/70 | HR 67 | Ht 61.0 in | Wt 133.1 lb

## 2022-05-05 DIAGNOSIS — I5022 Chronic systolic (congestive) heart failure: Secondary | ICD-10-CM

## 2022-05-05 DIAGNOSIS — I10 Essential (primary) hypertension: Secondary | ICD-10-CM

## 2022-05-05 DIAGNOSIS — Z8673 Personal history of transient ischemic attack (TIA), and cerebral infarction without residual deficits: Secondary | ICD-10-CM

## 2022-05-05 DIAGNOSIS — E785 Hyperlipidemia, unspecified: Secondary | ICD-10-CM

## 2022-05-05 NOTE — Progress Notes (Signed)
Cardiology Office Note   Date:  05/05/2022   ID:  [name], DOB Aug 02, 1922, MRN [id number]  PCP:  Crecencio Mc, MD  Cardiologist:   Kathlyn Sacramento, MD   Chief Complaint  Patient presents with   Other    13 Month f/u no complaints today. Meds reviewed verbally with pt.      History of Present Illness: [name] is a 88 y.o. female who presents for a follow up visit regarding nonischemic cardiomyopathy. She had cardiac catheterization in May 2014 which showed mild nonobstructive coronary artery disease involving the LAD and left circumflex. Ejection fraction was normal.  She does have left bundle branch block. Echocardiogram in November 2015 showed an ejection fraction of 45-50%, mild-to-moderate aortic regurgitation, mild mitral regurgitation and mild-to-moderate tricuspid regurgitation. Pulmonary pressure was normal.  She had TIA symptoms in June 2020.  Plavix was added to aspirin.  Carotid Doppler showed mild nonobstructive carotid disease.  MRI of the brain showed no evidence of acute or subacute infarct.  She was noted to have widespread intracranial atherosclerotic disease with possible flow-limiting stenosis in the proximal right MCA and proximal left PCA.  She had outpatient monitor which showed no evidence of atrial fibrillation although she was found to have frequent short episodes of SVT.  The dose of Toprol was increased to 37.5 mg.  She has been doing very well with no chest pain, shortness of breath or palpitations.  She continues to be independent and lives by herself.  She continues to drive.  Past Medical History:  Diagnosis Date   Carotid artery disease ([place])    Coronary artery disease, non-occlusive    Hyperlipidemia    Hypertension    Left bundle branch block    Nonischemic cardiomyopathy ([place])     Past Surgical History:  Procedure Laterality Date   ABDOMINAL HYSTERECTOMY     ankle     rods in both ankles   BREAST BIOPSY Bilateral YRS AGO    NEG   CARDIAC CATHETERIZATION     [facility]   left ankle  Octo 2012   s/p pinning , [place]   LEFT HEART CATHETERIZATION WITH CORONARY ANGIOGRAM N/A 10/31/2012   Procedure: LEFT HEART CATHETERIZATION WITH CORONARY ANGIOGRAM;  Surgeon: Clent Demark, MD;  Location: [facility];  Service: Cardiovascular;  Laterality: N/A;     Current Outpatient Medications  Medication Sig Dispense Refill   amLODipine (NORVASC) 5 MG tablet Take 1 tablet (5 mg total) by mouth 2 (two) times daily. 180 tablet 2   Calcium Carbonate-Vitamin D 600-200 MG-UNIT TABS Take by mouth.       clopidogrel (PLAVIX) 75 MG tablet TAKE 1 TABLET BY MOUTH DAILY 90 tablet 3   fish oil-omega-3 fatty acids 1000 MG capsule Take by mouth daily.       latanoprost (XALATAN) 0.005 % ophthalmic solution daily.     metoprolol succinate (TOPROL-XL) 25 MG 24 hr tablet TAKE ONE AND ONE-HALF TABLET BY MOUTH EVERY DAY 135 tablet 1   Multiple Vitamin (MULTIVITAMIN) tablet Take 1 tablet by mouth daily.       Multiple Vitamins-Minerals (EYE VITAMINS PO) Take 1 tablet by mouth daily.     pravastatin (PRAVACHOL) 20 MG tablet TAKE 1 TABLET BY MOUTH DAILY 90 tablet 1   telmisartan (MICARDIS) 80 MG tablet TAKE ONE TABLET EACH NIGHT 90 tablet 1   No current facility-administered medications for this visit.    Allergies:   Sulfa antibiotics and Contrast media [  iodinated contrast media]    Social History:  The patient  reports that she has never smoked. She has never used smokeless tobacco. She reports that she does not drink alcohol and does not use drugs.   Family History:  The patient's family history includes Cancer in her father; Heart attack in her brother and mother; Hypertension in her father and mother; Stroke in her sister.    ROS:  Please see the history of present illness.   Otherwise, review of systems are positive for none.   All other systems are reviewed and negative.    PHYSICAL EXAM: VS:  BP 110/70 (BP Location: Left Arm, Patient  Position: Sitting, Cuff Size: Normal)   Pulse 67   Ht '5\' 1"'$  (1.549 m)   Wt 133 lb 2 oz (60.4 kg)   SpO2 94%   BMI 25.15 kg/m  , BMI Body mass index is 25.15 kg/m. GEN: Well nourished, well developed, in no acute distress  HEENT: normal  Neck: no JVD, carotid bruits, or masses Cardiac: RRR; no rubs, or gallops,no edema . 1/ 6 systolic murmur in the aortic area Respiratory:  clear to auscultation bilaterally, normal work of breathing GI: soft, nontender, nondistended, + BS MS: no deformity or atrophy  Skin: warm and dry, no rash Neuro:  Strength and sensation are intact Psych: euthymic mood, full affect   EKG:  EKG is ordered today. The ekg ordered today demonstrates normal sinus rhythm with left bundle branch block.  1 PVC   Recent Labs: 04/19/2022: ALT 11; BUN 22; Creatinine, Ser 1.13; Hemoglobin 15.1; Platelets 202.0; Potassium 3.9; Sodium 139; TSH 4.04    Lipid Panel    Component Value Date/Time   CHOL 270 (H) 04/19/2022 0948   CHOL 226 (H) 09/16/2012 0542   TRIG 157.0 (H) 04/19/2022 0948   TRIG 109 09/16/2012 0542   HDL 54.80 04/19/2022 0948   HDL 60 09/16/2012 0542   CHOLHDL 5 04/19/2022 0948   VLDL 31.4 04/19/2022 0948   VLDL 22 09/16/2012 0542   LDLCALC 184 (H) 04/19/2022 0948   LDLCALC 144 (H) 09/16/2012 0542   LDLDIRECT 178.0 04/19/2022 0948      Wt Readings from Last 3 Encounters:  05/05/22 133 lb 2 oz (60.4 kg)  04/19/22 132 lb 3.2 oz (60 kg)  04/10/22 132 lb (59.9 kg)        ASSESSMENT AND PLAN:  1.  Chronic systolic heart failure: Due to nonischemic cardiomyopathy. Ejection fraction was 45-50%. Currently New York Heart Association class II. No evidence of volume overload. Continue Toprol and telmisartan.    2. Essential hypertension: Blood pressure is well control on current medications.  3.  Hyperlipidemia: Intolerance to statins and Zetia.  However, she is not tolerating pravastatin twice a week.  No indication for more aggressive lipid  management considering her age.  4.  Previous TIA: On clopidogrel without aspirin.   Disposition:   FU with me in 12 months  Signed,  Kathlyn Sacramento, MD  05/05/2022 8:53 AM    [place]

## 2022-05-05 NOTE — Patient Instructions (Signed)
Medication Instructions:  No changes *If you need a refill on your cardiac medications before your next appointment, please call your pharmacy*   Lab Work: None ordered If you have labs (blood work) drawn today and your tests are completely normal, you will receive your results only by: [place] (if you have MyChart) OR A paper copy in the mail If you have any lab test that is abnormal or we need to change your treatment, we will call you to review the results.   Testing/Procedures: None ordered   Follow-Up: At [facility], you and your health needs are our priority.  As part of our continuing mission to provide you with exceptional heart care, we have created designated Provider Care Teams.  These Care Teams include your primary Cardiologist (physician) and Advanced Practice Providers (APPs -  Physician Assistants and Nurse Practitioners) who all work together to provide you with the care you need, when you need it.  We recommend signing up for the patient portal called "MyChart".  Sign up information is provided on this After Visit Summary.  MyChart is used to connect with patients for Virtual Visits (Telemedicine).  Patients are able to view lab/test results, encounter notes, upcoming appointments, etc.  Non-urgent messages can be sent to your provider as well.   To learn more about what you can do with MyChart, go to [email or web address].    Your next appointment:   12 month(s)  The format for your next appointment:   In Person  Provider:   You may see Dr. Fletcher Anon or one of the following Advanced Practice Providers on your designated Care Team:   Murray Hodgkins, NP Christell Faith, PA-C Cadence Kathlen Mody, PA-C Gerrie Nordmann, NP     Important Information About Sugar

## 2022-05-25 ENCOUNTER — Other Ambulatory Visit: Payer: Self-pay | Admitting: Cardiovascular Disease

## 2022-06-05 DIAGNOSIS — H40003 Preglaucoma, unspecified, bilateral: Secondary | ICD-10-CM | POA: Diagnosis not present

## 2022-07-19 ENCOUNTER — Other Ambulatory Visit: Payer: Self-pay | Admitting: Internal Medicine

## 2022-07-25 DIAGNOSIS — L97511 Non-pressure chronic ulcer of other part of right foot limited to breakdown of skin: Secondary | ICD-10-CM | POA: Diagnosis not present

## 2022-07-25 DIAGNOSIS — M2041 Other hammer toe(s) (acquired), right foot: Secondary | ICD-10-CM | POA: Diagnosis not present

## 2022-08-02 ENCOUNTER — Other Ambulatory Visit: Payer: Self-pay | Admitting: Internal Medicine

## 2022-08-08 DIAGNOSIS — M2041 Other hammer toe(s) (acquired), right foot: Secondary | ICD-10-CM | POA: Diagnosis not present

## 2022-08-08 DIAGNOSIS — L97511 Non-pressure chronic ulcer of other part of right foot limited to breakdown of skin: Secondary | ICD-10-CM | POA: Diagnosis not present

## 2022-08-22 DIAGNOSIS — M2041 Other hammer toe(s) (acquired), right foot: Secondary | ICD-10-CM | POA: Diagnosis not present

## 2022-09-05 DIAGNOSIS — L97511 Non-pressure chronic ulcer of other part of right foot limited to breakdown of skin: Secondary | ICD-10-CM | POA: Diagnosis not present

## 2022-09-05 DIAGNOSIS — L02611 Cutaneous abscess of right foot: Secondary | ICD-10-CM | POA: Diagnosis not present

## 2022-09-05 DIAGNOSIS — L03031 Cellulitis of right toe: Secondary | ICD-10-CM | POA: Diagnosis not present

## 2022-09-12 DIAGNOSIS — L03031 Cellulitis of right toe: Secondary | ICD-10-CM | POA: Diagnosis not present

## 2022-09-12 DIAGNOSIS — L97511 Non-pressure chronic ulcer of other part of right foot limited to breakdown of skin: Secondary | ICD-10-CM | POA: Diagnosis not present

## 2022-09-12 DIAGNOSIS — L02611 Cutaneous abscess of right foot: Secondary | ICD-10-CM | POA: Diagnosis not present

## 2022-09-28 DIAGNOSIS — L02611 Cutaneous abscess of right foot: Secondary | ICD-10-CM | POA: Diagnosis not present

## 2022-09-28 DIAGNOSIS — L03031 Cellulitis of right toe: Secondary | ICD-10-CM | POA: Diagnosis not present

## 2022-09-28 DIAGNOSIS — L97511 Non-pressure chronic ulcer of other part of right foot limited to breakdown of skin: Secondary | ICD-10-CM | POA: Diagnosis not present

## 2022-09-28 DIAGNOSIS — M2041 Other hammer toe(s) (acquired), right foot: Secondary | ICD-10-CM | POA: Diagnosis not present

## 2022-09-29 DIAGNOSIS — S99912A Unspecified injury of left ankle, initial encounter: Secondary | ICD-10-CM | POA: Diagnosis not present

## 2022-09-29 DIAGNOSIS — M19072 Primary osteoarthritis, left ankle and foot: Secondary | ICD-10-CM | POA: Diagnosis not present

## 2022-09-29 DIAGNOSIS — S99922A Unspecified injury of left foot, initial encounter: Secondary | ICD-10-CM | POA: Diagnosis not present

## 2022-10-10 DIAGNOSIS — M2041 Other hammer toe(s) (acquired), right foot: Secondary | ICD-10-CM | POA: Diagnosis not present

## 2022-10-10 DIAGNOSIS — L97511 Non-pressure chronic ulcer of other part of right foot limited to breakdown of skin: Secondary | ICD-10-CM | POA: Diagnosis not present

## 2022-10-31 DIAGNOSIS — L97511 Non-pressure chronic ulcer of other part of right foot limited to breakdown of skin: Secondary | ICD-10-CM | POA: Diagnosis not present

## 2022-10-31 DIAGNOSIS — M2041 Other hammer toe(s) (acquired), right foot: Secondary | ICD-10-CM | POA: Diagnosis not present

## 2022-11-20 ENCOUNTER — Telehealth: Payer: Self-pay

## 2022-11-20 NOTE — Telephone Encounter (Signed)
Patient states she has been checking her blood pressure at home and it seems to be jumping around a lot.  Patient states she thinks something may be wrong with her machine.  Patient states she would like to come in and have it checked.  I offered patient an appointment this afternoon and tomorrow afternoon.  Patient states she is 89 years old and she doesn't like to drive late in the afternoon.  I let patient know that we do not have any appointments in the morning for a nurse visit this week.  I let patient know that I will send a message back to Dr. Darrick Huntsman to let her know.  Patient states she would like to have a call back.

## 2022-11-20 NOTE — Telephone Encounter (Signed)
Spoke with pt and scheduled her to see [name], NP tomorrow morning.

## 2022-11-21 ENCOUNTER — Encounter: Payer: Self-pay | Admitting: Nurse Practitioner

## 2022-11-21 ENCOUNTER — Ambulatory Visit (INDEPENDENT_AMBULATORY_CARE_PROVIDER_SITE_OTHER): Payer: Medicare Other | Admitting: Nurse Practitioner

## 2022-11-21 VITALS — BP 140/70 | HR 61 | Temp 98.0°F | Ht 61.0 in | Wt 134.8 lb

## 2022-11-21 DIAGNOSIS — I10 Essential (primary) hypertension: Secondary | ICD-10-CM

## 2022-11-21 NOTE — Progress Notes (Signed)
Jacqueline Dicker, NP-C Phone: [phone]  [name] is a 89 y.o. female who presents today for blood pressure concerns.   She has been checking her blood pressure daily at home for the last few days and is concerned about her readings. She has not been getting consistent readings- ranging from 92-123/ 50-65. She does report she believes her blood pressure cuff at home is too large as she has difficulty getting it on and taking her blood pressure. She is planning to get a new cuff today. She is asymptomatic. Denies dizziness. Denies vision changes. Denies headaches. Denies weakness.   HYPERTENSION Disease Monitoring Home BP Monitoring- see above Chest pain- No    Dyspnea- No Medications Compliance-  Norvasc, Toprol XL and Telmisartan. Lightheadedness-  No  Edema- No BMET    Component Value Date/Time   NA 139 04/19/2022 0948   NA 135 (L) 09/14/2012 1518   K 3.9 04/19/2022 0948   K 3.4 (L) 09/14/2012 1518   CL 103 04/19/2022 0948   CL 102 09/14/2012 1518   CO2 28 04/19/2022 0948   CO2 28 09/14/2012 1518   GLUCOSE 91 04/19/2022 0948   GLUCOSE 149 (H) 09/14/2012 1518   BUN 22 04/19/2022 0948   BUN 24 (H) 09/14/2012 1518   CREATININE 1.13 04/19/2022 0948   CREATININE 0.79 09/14/2012 1518   CREATININE 0.98 09/11/2011 0944   CALCIUM 10.2 04/19/2022 0948   CALCIUM 9.2 09/14/2012 1518   GFRNONAA >60 09/14/2012 1518   GFRNONAA 52 (L) 09/11/2011 0944   GFRAA >60 09/14/2012 1518   GFRAA 60 09/11/2011 0944    Social History   Tobacco Use  Smoking Status Never  Smokeless Tobacco Never    Current Outpatient Medications on File Prior to Visit  Medication Sig Dispense Refill   amLODipine (NORVASC) 5 MG tablet Take 1 tablet (5 mg total) by mouth 2 (two) times daily. 180 tablet 2   Calcium Carbonate-Vitamin D 600-200 MG-UNIT TABS Take by mouth.       clopidogrel (PLAVIX) 75 MG tablet TAKE 1 TABLET BY MOUTH DAILY 90 tablet 3   fish oil-omega-3 fatty acids 1000 MG capsule Take by  mouth daily.       latanoprost (XALATAN) 0.005 % ophthalmic solution daily.     metoprolol succinate (TOPROL-XL) 25 MG 24 hr tablet TAKE ONE AND ONE-HALF TABLET BY MOUTH EVERY DAY 135 tablet 2   Multiple Vitamin (MULTIVITAMIN) tablet Take 1 tablet by mouth daily.       Multiple Vitamins-Minerals (EYE VITAMINS PO) Take 1 tablet by mouth daily.     pravastatin (PRAVACHOL) 20 MG tablet TAKE 1 TABLET BY MOUTH DAILY 90 tablet 1   telmisartan (MICARDIS) 80 MG tablet TAKE ONE TABLET EACH NIGHT 90 tablet 1   No current facility-administered medications on file prior to visit.    ROS see history of present illness  Objective  Physical Exam Vitals:   11/21/22 0910 11/21/22 0925  BP: (!) 140/70 (!) 140/70  Pulse: 61   Temp: 98 F (36.7 C)   SpO2: 96%     BP Readings from Last 3 Encounters:  11/21/22 (!) 140/70  05/05/22 110/70  04/19/22 132/68   Wt Readings from Last 3 Encounters:  11/21/22 134 lb 12.8 oz (61.1 kg)  05/05/22 133 lb 2 oz (60.4 kg)  04/19/22 132 lb 3.2 oz (60 kg)    Physical Exam Constitutional:      General: She is not in acute distress.    Appearance: Normal appearance.  HENT:     Head: Normocephalic.  Cardiovascular:     Rate and Rhythm: Normal rate and regular rhythm.     Heart sounds: Normal heart sounds.  Pulmonary:     Effort: Pulmonary effort is normal.     Breath sounds: Normal breath sounds.  Skin:    General: Skin is warm and dry.  Neurological:     General: No focal deficit present.     Mental Status: She is alert.  Psychiatric:        Mood and Affect: Mood normal.        Behavior: Behavior normal.    Assessment/Plan: Please see individual problem list.  Primary hypertension Assessment & Plan: Systolic slightly elevated today in office x 2. Home blood pressure numbers reviewed with patient. Averaging 100s/50s. She does believe her cuff is too large, therefore making her readings inaccurate. She is planning to get a new cuff today. Advised  patient to obtain new cuff and check her blood pressure daily at home and keep a log to bring to her next appointment. She will continue her current medication regimen. She will return in one week to review her numbers and bring her new cuff with her. Education provided on how to properly check blood pressure at home. Strict return precautions given to patient.     Return in about 1 week (around 11/28/2022) for Follow up.   Jacqueline Dicker, NP-C [facility] - [employer]

## 2022-11-24 NOTE — Assessment & Plan Note (Addendum)
Systolic slightly elevated today in office x 2. Home blood pressure numbers reviewed with patient. Averaging 100s/50s. She does believe her cuff is too large, therefore making her readings inaccurate. She is planning to get a new cuff today. Advised patient to obtain new cuff and check her blood pressure daily at home and keep a log to bring to her next appointment. She will continue her current medication regimen. She will return in one week to review her numbers and bring her new cuff with her. Education provided on how to properly check blood pressure at home. Strict return precautions given to patient.

## 2022-11-28 DIAGNOSIS — M2041 Other hammer toe(s) (acquired), right foot: Secondary | ICD-10-CM | POA: Diagnosis not present

## 2022-11-28 NOTE — Progress Notes (Unsigned)
[name], NP-C Phone: [phone]  [name] is a 89 y.o. female who presents today for follow up.  Patient seen 11/21/2022 regarding inconsistent blood pressure readings/low blood pressure readings at home. She believed her home cuff was too large. She has since gotten a new blood pressure cuff and has continued to check her blood pressure daily at home. She continues to have soft blood pressure readings at home when her numbers were reviewed- 100-120/50-60. She was seen by Podiatry yesterday where her BP was 157/66. She did bring her new cuff with her today to compare. BP obtained in office today was 139/73 compared to her new home machine which read 119/69. She had another machine with her, an older one, that also read lower when compared to manual blood pressure obtained in office. She is asymptomatic. Denies weakness. Denies lightheadedness.   HYPERTENSION Disease Monitoring Home BP Monitoring- see above Chest pain- No    Dyspnea- No Medications Compliance-  Norvasc, Toprol XL, Telmisartan. Lightheadedness-  No  Edema- No BMET    Component Value Date/Time   NA 139 04/19/2022 0948   NA 135 (L) 09/14/2012 1518   K 3.9 04/19/2022 0948   K 3.4 (L) 09/14/2012 1518   CL 103 04/19/2022 0948   CL 102 09/14/2012 1518   CO2 28 04/19/2022 0948   CO2 28 09/14/2012 1518   GLUCOSE 91 04/19/2022 0948   GLUCOSE 149 (H) 09/14/2012 1518   BUN 22 04/19/2022 0948   BUN 24 (H) 09/14/2012 1518   CREATININE 1.13 04/19/2022 0948   CREATININE 0.79 09/14/2012 1518   CREATININE 0.98 09/11/2011 0944   CALCIUM 10.2 04/19/2022 0948   CALCIUM 9.2 09/14/2012 1518   GFRNONAA >60 09/14/2012 1518   GFRNONAA 52 (L) 09/11/2011 0944   GFRAA >60 09/14/2012 1518   GFRAA 60 09/11/2011 0944     Social History   Tobacco Use  Smoking Status Never  Smokeless Tobacco Never    Current Outpatient Medications on File Prior to Visit  Medication Sig Dispense Refill   amLODipine (NORVASC) 5 MG tablet  Take 1 tablet (5 mg total) by mouth 2 (two) times daily. 180 tablet 2   Calcium Carbonate-Vitamin D 600-200 MG-UNIT TABS Take by mouth.       clopidogrel (PLAVIX) 75 MG tablet TAKE 1 TABLET BY MOUTH DAILY 90 tablet 3   fish oil-omega-3 fatty acids 1000 MG capsule Take by mouth daily.       latanoprost (XALATAN) 0.005 % ophthalmic solution daily.     metoprolol succinate (TOPROL-XL) 25 MG 24 hr tablet TAKE ONE AND ONE-HALF TABLET BY MOUTH EVERY DAY 135 tablet 2   Multiple Vitamin (MULTIVITAMIN) tablet Take 1 tablet by mouth daily.       Multiple Vitamins-Minerals (EYE VITAMINS PO) Take 1 tablet by mouth daily.     pravastatin (PRAVACHOL) 20 MG tablet TAKE 1 TABLET BY MOUTH DAILY 90 tablet 1   telmisartan (MICARDIS) 80 MG tablet TAKE ONE TABLET EACH NIGHT 90 tablet 1   No current facility-administered medications on file prior to visit.     ROS see history of present illness  Objective  Physical Exam Vitals:   11/29/22 0836  BP: 139/73  Pulse: 67  Temp: 97.7 F (36.5 C)  SpO2: 95%    BP Readings from Last 3 Encounters:  11/29/22 139/73  11/21/22 (!) 140/70  05/05/22 110/70   Wt Readings from Last 3 Encounters:  11/29/22 132 lb 12.8 oz (60.2 kg)  11/21/22 134 lb 12.8  oz (61.1 kg)  05/05/22 133 lb 2 oz (60.4 kg)    Physical Exam Constitutional:      General: She is not in acute distress.    Appearance: Normal appearance.  HENT:     Head: Normocephalic.  Cardiovascular:     Rate and Rhythm: Normal rate and regular rhythm.     Heart sounds: Normal heart sounds.  Pulmonary:     Effort: Pulmonary effort is normal.     Breath sounds: Normal breath sounds.  Skin:    General: Skin is warm and dry.  Neurological:     General: No focal deficit present.     Mental Status: She is alert.  Psychiatric:        Mood and Affect: Mood normal.        Behavior: Behavior normal.    Assessment/Plan: Please see individual problem list.  Primary hypertension Assessment &  Plan: Home blood pressure machines compared x 2 to readings in office and reading lower. I do not feel that her blood pressure is too low, if anything it may actually be slightly elevated based on in office readings. Advised that she can spot check her blood pressure at home and adjust according to findings today but she does not have to check multiple times daily. She will follow up in 2 weeks for a nurse to check her blood pressure here in the office then she will follow up with her PCP in one month. Strict return precautions given to patient.     Return in about 2 weeks (around 12/13/2022) for Blood pressure check then in one month with PCP.   [name], NP-C [facility] - [employer]

## 2022-11-29 ENCOUNTER — Ambulatory Visit (INDEPENDENT_AMBULATORY_CARE_PROVIDER_SITE_OTHER): Payer: Medicare Other | Admitting: Nurse Practitioner

## 2022-11-29 ENCOUNTER — Encounter: Payer: Self-pay | Admitting: Nurse Practitioner

## 2022-11-29 VITALS — BP 139/73 | HR 67 | Temp 97.7°F | Ht 61.0 in | Wt 132.8 lb

## 2022-11-29 DIAGNOSIS — I10 Essential (primary) hypertension: Secondary | ICD-10-CM

## 2022-11-29 NOTE — Assessment & Plan Note (Addendum)
Home blood pressure machines compared x 2 to readings in office and reading lower. I do not feel that her blood pressure is too low, if anything it may actually be slightly elevated based on in office readings. Advised that she can spot check her blood pressure at home and adjust according to findings today but she does not have to check multiple times daily. She will follow up in 2 weeks for a nurse to check her blood pressure here in the office then she will follow up with her PCP in one month. Strict return precautions given to patient.

## 2022-12-04 DIAGNOSIS — H2513 Age-related nuclear cataract, bilateral: Secondary | ICD-10-CM | POA: Diagnosis not present

## 2022-12-04 DIAGNOSIS — H40009 Preglaucoma, unspecified, unspecified eye: Secondary | ICD-10-CM | POA: Diagnosis not present

## 2022-12-06 ENCOUNTER — Other Ambulatory Visit: Payer: Self-pay | Admitting: Internal Medicine

## 2022-12-18 ENCOUNTER — Ambulatory Visit (INDEPENDENT_AMBULATORY_CARE_PROVIDER_SITE_OTHER): Payer: Medicare Other

## 2022-12-18 DIAGNOSIS — I10 Essential (primary) hypertension: Secondary | ICD-10-CM

## 2022-12-18 NOTE — Progress Notes (Addendum)
Pt presented today for a BP check. Pt was identified through two identifiers. After going over med list, pt was able to confirm that she takes amLODipine (NORVASC) 5 MG tablet  &  telmisartan (MICARDIS) 80 MG tablet .  Pt also brought in her machine to be calibrated.   I let pt resent for about 5-6 minutes before recording the 1st reading:  BP: 138/74 P: 60  Her machine: BP: 133/67 P: 64  Pt denies: Headache, blurred vision, nausea, chest pain, SOB, or any dizziness and numbness.   Due to BP readings being WNL and pt did not have any symptoms she was released.  Pt was also informed that her BP cuff is preety close to our readings and she has been given a BP log paper to record her readings to bring to her next appt.   I have reviewed the above information and agree with above.   Duncan Dull, MD

## 2023-01-02 ENCOUNTER — Ambulatory Visit (INDEPENDENT_AMBULATORY_CARE_PROVIDER_SITE_OTHER): Payer: Medicare Other | Admitting: Internal Medicine

## 2023-01-02 ENCOUNTER — Encounter: Payer: Self-pay | Admitting: Internal Medicine

## 2023-01-02 VITALS — BP 130/72 | HR 63 | Temp 98.3°F | Ht 61.0 in | Wt 134.4 lb

## 2023-01-02 DIAGNOSIS — R7303 Prediabetes: Secondary | ICD-10-CM | POA: Diagnosis not present

## 2023-01-02 DIAGNOSIS — E782 Mixed hyperlipidemia: Secondary | ICD-10-CM

## 2023-01-02 DIAGNOSIS — I10 Essential (primary) hypertension: Secondary | ICD-10-CM

## 2023-01-02 LAB — LIPID PANEL
Cholesterol: 258 mg/dL — ABNORMAL HIGH (ref 0–200)
HDL: 45.1 mg/dL (ref >39.00)
NonHDL: 213.36
Total CHOL/HDL Ratio: 6
Triglycerides: 243 mg/dL — ABNORMAL HIGH (ref 0.0–149.0)
VLDL: 48.6 mg/dL — ABNORMAL HIGH (ref 0.0–40.0)

## 2023-01-02 LAB — COMPREHENSIVE METABOLIC PANEL
ALT: 12 U/L (ref 0–35)
AST: 19 U/L (ref 0–37)
Albumin: 3.9 g/dL (ref 3.5–5.2)
Alkaline Phosphatase: 69 U/L (ref 39–117)
BUN: 21 mg/dL (ref 6–23)
CO2: 28 mEq/L (ref 19–32)
Calcium: 9.8 mg/dL (ref 8.4–10.5)
Chloride: 105 mEq/L (ref 96–112)
Creatinine, Ser: 1.04 mg/dL (ref 0.40–1.20)
GFR: 44.84 mL/min — ABNORMAL LOW (ref >60.00)
Glucose, Bld: 95 mg/dL (ref 70–99)
Potassium: 4.2 mEq/L (ref 3.5–5.1)
Sodium: 138 mEq/L (ref 135–145)
Total Bilirubin: 0.7 mg/dL (ref 0.2–1.2)
Total Protein: 7 g/dL (ref 6.0–8.3)

## 2023-01-02 LAB — LDL CHOLESTEROL, DIRECT: Direct LDL: 160 mg/dL

## 2023-01-02 LAB — HEMOGLOBIN A1C: Hgb A1c MFr Bld: 5.9 % (ref 4.6–6.5)

## 2023-01-02 MED ORDER — TELMISARTAN 80 MG PO TABS: 80.0000 mg | ORAL_TABLET | Freq: Every day | ORAL | 1 refills | Status: DC

## 2023-01-02 NOTE — Assessment & Plan Note (Signed)
Based on home readings,  She is at goal on amlodipine  5 mg bid,   Metoprolol 38.5 mg daily and telmisartan 80 mg daily .  No changes today

## 2023-01-02 NOTE — Patient Instructions (Addendum)
Your blood pressure is well controlled.  Please Continue your current medications,   and return in  6 months

## 2023-01-02 NOTE — Progress Notes (Signed)
Subjective:  Patient ID: [name], female    DOB: [birth year]  Age: 89 y.o. MRN: [id number]  CC: The primary encounter diagnosis was Primary hypertension. Diagnoses of Hyperlipidemia, mixed and Prediabetes were also pertinent to this visit.   HPI [name] presents for  Chief Complaint  Patient presents with   Medical Management of Chronic Issues    1 month follow up    1) HTN:  patient has been experiencing asymptomatic low readings at home  on consumer blood pressure kit.  The low readings  have not been reproduced in the office with side by side comparisons.    Her BP regiemn is currently amlodipine 5 MG BID ,  telmisartan 80 MG DAILY  metoprolol succinate 37.5 MG DAILY      Outpatient Medications Prior to Visit  Medication Sig Dispense Refill   amLODipine (NORVASC) 5 MG tablet TAKE ONE (1) TABLET BY MOUTH TWO TIMES PER DAY 180 tablet 2   Calcium Carbonate-Vitamin D 600-200 MG-UNIT TABS Take by mouth.       clopidogrel (PLAVIX) 75 MG tablet TAKE 1 TABLET BY MOUTH DAILY 90 tablet 3   fish oil-omega-3 fatty acids 1000 MG capsule Take by mouth daily.       latanoprost (XALATAN) 0.005 % ophthalmic solution daily.     metoprolol succinate (TOPROL-XL) 25 MG 24 hr tablet TAKE ONE AND ONE-HALF TABLET BY MOUTH EVERY DAY 135 tablet 2   Multiple Vitamin (MULTIVITAMIN) tablet Take 1 tablet by mouth daily.       Multiple Vitamins-Minerals (EYE VITAMINS PO) Take 1 tablet by mouth daily.     pravastatin (PRAVACHOL) 20 MG tablet TAKE 1 TABLET BY MOUTH DAILY 90 tablet 1   telmisartan (MICARDIS) 80 MG tablet TAKE ONE TABLET EACH NIGHT 90 tablet 1   No facility-administered medications prior to visit.    Review of Systems;  Patient denies headache, fevers, malaise, unintentional weight loss, skin rash, eye pain, sinus congestion and sinus pain, sore throat, dysphagia,  hemoptysis , cough, dyspnea, wheezing, chest pain, palpitations, orthopnea, edema, abdominal pain, nausea, melena,  diarrhea, constipation, flank pain, dysuria, hematuria, urinary  Frequency, nocturia, numbness, tingling, seizures,  Focal weakness, Loss of consciousness,  Tremor, insomnia, depression, anxiety, and suicidal ideation.      Objective:  BP 130/72   Pulse 63   Temp 98.3 F (36.8 C) (Oral)   Ht 5\' 1"  (1.549 m)   Wt 134 lb 6.4 oz (61 kg)   SpO2 94%   BMI 25.39 kg/m   BP Readings from Last 3 Encounters:  01/02/23 130/72  11/29/22 139/73  11/21/22 (!) 140/70    Wt Readings from Last 3 Encounters:  01/02/23 134 lb 6.4 oz (61 kg)  11/29/22 132 lb 12.8 oz (60.2 kg)  11/21/22 134 lb 12.8 oz (61.1 kg)    Physical Exam Vitals reviewed.  Constitutional:      General: She is not in acute distress.    Appearance: Normal appearance. She is normal weight. She is not ill-appearing, toxic-appearing or diaphoretic.  HENT:     Head: Normocephalic.  Eyes:     General: No scleral icterus.       Right eye: No discharge.        Left eye: No discharge.     Conjunctiva/sclera: Conjunctivae normal.  Cardiovascular:     Rate and Rhythm: Normal rate and regular rhythm.     Heart sounds: Normal heart sounds.  Pulmonary:     Effort: Pulmonary  effort is normal. No respiratory distress.     Breath sounds: Normal breath sounds.  Musculoskeletal:        General: Normal range of motion.  Skin:    General: Skin is warm and dry.  Neurological:     General: No focal deficit present.     Mental Status: She is alert and oriented to person, place, and time. Mental status is at baseline.  Psychiatric:        Mood and Affect: Mood normal.        Behavior: Behavior normal.        Thought Content: Thought content normal.        Judgment: Judgment normal.   Lab Results  Component Value Date   HGBA1C 5.9 01/02/2023   HGBA1C 5.9 04/19/2022    Lab Results  Component Value Date   CREATININE 1.04 01/02/2023   CREATININE 1.13 04/19/2022   CREATININE 1.10 04/18/2021    Lab Results  Component Value  Date   WBC 7.8 04/19/2022   HGB 15.1 (H) 04/19/2022   HCT 45.1 04/19/2022   PLT 202.0 04/19/2022   GLUCOSE 95 01/02/2023   CHOL 258 (H) 01/02/2023   TRIG 243.0 (H) 01/02/2023   HDL 45.10 01/02/2023   LDLDIRECT 160.0 01/02/2023   LDLCALC 184 (H) 04/19/2022   ALT 12 01/02/2023   AST 19 01/02/2023   NA 138 01/02/2023   K 4.2 01/02/2023   CL 105 01/02/2023   CREATININE 1.04 01/02/2023   BUN 21 01/02/2023   CO2 28 01/02/2023   TSH 4.04 04/19/2022   HGBA1C 5.9 01/02/2023   MICROALBUR 2.1 (H) 04/19/2022    MR Brain Wo Contrast  Result Date: 12/02/2018 CLINICAL DATA:  Episode of dysarthria on 11/24/2018. EXAM: MRI HEAD WITHOUT CONTRAST TECHNIQUE: Multiplanar, multiecho pulse sequences of the brain and surrounding structures were obtained without intravenous contrast. COMPARISON:  None. FINDINGS: Brain: There is no evidence of acute infarct, intracranial hemorrhage, mass, midline shift, or extra-axial fluid collection. Cerebral white matter T2 hyperintensities are nonspecific but compatible with mild chronic small vessel ischemic disease. Cerebral atrophy is at most mildly advanced for age. Vascular: Major intracranial vascular flow voids are preserved. Skull and upper cervical spine: Unremarkable bone marrow signal. Sinuses/Orbits: Unremarkable orbits. Paranasal sinuses and mastoid air cells are clear. Other: None. IMPRESSION: 1. No evidence of acute or subacute infarct. 2. Mild chronic small vessel ischemic disease. Electronically Signed   By: Sebastian Ache M.D.   On: 12/02/2018 08:44    Assessment & Plan:  .Primary hypertension Assessment & Plan: Based on home readings,  She is at goal on amlodipine  5 mg bid,   Metoprolol 38.5 mg daily and telmisartan 80 mg daily .  No changes today  Orders: -     Comprehensive metabolic panel  Hyperlipidemia, mixed -     Lipid panel -     LDL cholesterol, direct  Prediabetes -     Hemoglobin A1c -     Comprehensive metabolic panel  Other  orders -     Telmisartan; Take 1 tablet (80 mg total) by mouth daily.  Dispense: 90 tablet; Refill: 1     Follow-up: No follow-ups on file.   Sherlene Shams, MD

## 2023-01-08 ENCOUNTER — Telehealth: Payer: Self-pay

## 2023-01-08 NOTE — Telephone Encounter (Signed)
Attempted to call pt. No answer no voicemail.  

## 2023-01-08 NOTE — Telephone Encounter (Signed)
-----   Message from Sherlene Shams sent at 01/05/2023  5:04 PM EDT ----- Your CBC,  A1c,  cholesterol,  liver and kidney function have been reviewed and there are no significant or worrisome findings .[name]  Please continue your current medications and  plan to repeat  Non fasting labs in 6 months.   Regards,  Dr. Darrick Huntsman

## 2023-01-16 DIAGNOSIS — L97511 Non-pressure chronic ulcer of other part of right foot limited to breakdown of skin: Secondary | ICD-10-CM | POA: Diagnosis not present

## 2023-01-16 DIAGNOSIS — M2041 Other hammer toe(s) (acquired), right foot: Secondary | ICD-10-CM | POA: Diagnosis not present

## 2023-01-30 DIAGNOSIS — M2041 Other hammer toe(s) (acquired), right foot: Secondary | ICD-10-CM | POA: Diagnosis not present

## 2023-01-30 DIAGNOSIS — L97511 Non-pressure chronic ulcer of other part of right foot limited to breakdown of skin: Secondary | ICD-10-CM | POA: Diagnosis not present

## 2023-02-14 ENCOUNTER — Telehealth: Payer: Self-pay | Admitting: Internal Medicine

## 2023-02-14 NOTE — Telephone Encounter (Signed)
The letter requested has been drafted. Please print for signing.

## 2023-02-15 ENCOUNTER — Telehealth: Payer: Self-pay | Admitting: Internal Medicine

## 2023-02-15 NOTE — Telephone Encounter (Signed)
Spoke with pt's daughter to let her know that pt will be due for her tetanus booster on 04/02/2023.

## 2023-02-15 NOTE — Telephone Encounter (Signed)
Pt daughter called asking if her mom have had a T-Dap vaccine before? Please would like a call back [phone].

## 2023-02-15 NOTE — Telephone Encounter (Signed)
Printed and placed in quick sign folder 

## 2023-02-22 ENCOUNTER — Other Ambulatory Visit: Payer: Self-pay | Admitting: Cardiovascular Disease

## 2023-03-20 DIAGNOSIS — Z23 Encounter for immunization: Secondary | ICD-10-CM | POA: Diagnosis not present

## 2023-03-26 DIAGNOSIS — R300 Dysuria: Secondary | ICD-10-CM | POA: Diagnosis not present

## 2023-03-26 DIAGNOSIS — N390 Urinary tract infection, site not specified: Secondary | ICD-10-CM | POA: Diagnosis not present

## 2023-04-01 DIAGNOSIS — Z23 Encounter for immunization: Secondary | ICD-10-CM | POA: Diagnosis not present

## 2023-04-03 ENCOUNTER — Telehealth: Payer: Self-pay | Admitting: Internal Medicine

## 2023-04-03 DIAGNOSIS — L97511 Non-pressure chronic ulcer of other part of right foot limited to breakdown of skin: Secondary | ICD-10-CM | POA: Diagnosis not present

## 2023-04-03 DIAGNOSIS — M2041 Other hammer toe(s) (acquired), right foot: Secondary | ICD-10-CM | POA: Diagnosis not present

## 2023-04-03 NOTE — Telephone Encounter (Signed)
Patient called and states she does have High BP but today it reach 180. She states has no symptoms feels fine. Please call

## 2023-04-04 DIAGNOSIS — H93299 Other abnormal auditory perceptions, unspecified ear: Secondary | ICD-10-CM | POA: Diagnosis not present

## 2023-04-04 DIAGNOSIS — H6123 Impacted cerumen, bilateral: Secondary | ICD-10-CM | POA: Diagnosis not present

## 2023-04-04 NOTE — Telephone Encounter (Signed)
Spoke with pt and she stated that yesterday her bp was running high and today it is as well. BP readings for today this morning was 158/74 and a few hours later was 156/71. Pt is currently taking Amlodipine 5  mg BID, Metoprolol 25 mg 1 and one half tablet daily, and Telmisartan 80 mg daily. I have scheduled pt for a follow up on 04/16/2023. Is there anything that pt needs to do before her appt?

## 2023-04-04 NOTE — Telephone Encounter (Signed)
Patient called again regarding bp took it at 7:30am and reading was 158/74 then again at 10:30am 156/71. She would like a call back if she should double up on her BP med please call

## 2023-04-05 NOTE — Telephone Encounter (Signed)
LMTCB

## 2023-04-11 NOTE — Telephone Encounter (Signed)
LMTCB

## 2023-04-16 ENCOUNTER — Ambulatory Visit (INDEPENDENT_AMBULATORY_CARE_PROVIDER_SITE_OTHER): Payer: Medicare Other | Admitting: Internal Medicine

## 2023-04-16 ENCOUNTER — Encounter: Payer: Self-pay | Admitting: Internal Medicine

## 2023-04-16 VITALS — BP 133/62 | HR 66 | Ht 61.0 in | Wt 133.6 lb

## 2023-04-16 DIAGNOSIS — E782 Mixed hyperlipidemia: Secondary | ICD-10-CM

## 2023-04-16 DIAGNOSIS — I10 Essential (primary) hypertension: Secondary | ICD-10-CM

## 2023-04-16 DIAGNOSIS — Z789 Other specified health status: Secondary | ICD-10-CM | POA: Diagnosis not present

## 2023-04-16 DIAGNOSIS — N1832 Chronic kidney disease, stage 3b: Secondary | ICD-10-CM

## 2023-04-16 MED ORDER — PRAVASTATIN SODIUM 20 MG PO TABS: 20.0000 mg | ORAL_TABLET | Freq: Every day | ORAL | 1 refills | Status: DC

## 2023-04-16 NOTE — Assessment & Plan Note (Signed)
She is tolerating pravastatin

## 2023-04-16 NOTE — Patient Instructions (Addendum)
Your last 6 blood pressure readings were FINE so we did not make any changes today.  Continue metoprolol 1.5 tablets daily,  amlodipine twice daily,  and telmisartan once daily   Please reduce your BP checks to once daily and send me readings in 2 weeks    Your back pain does not sound serious at this point.  Continue using tylenol ,  add heating pad,  and GO FOR A WALK  EVERY DAY IF YOU CAN TOLERATE IT   RETURN IN 3 MONTHS  FASTING LABS PRIOR TO VISIT

## 2023-04-16 NOTE — Progress Notes (Signed)
Subjective:  Patient ID: [name], female    DOB: 08-23-1922  Age: 89 y.o. MRN: [id number]  CC: The primary encounter diagnosis was Primary hypertension. Diagnoses of Mixed hyperlipidemia, Statin intolerance, and Stage 3b chronic kidney disease (HCC) were also pertinent to this visit.   HPI [name] presents for  Chief Complaint  Patient presents with   Medical Management of Chronic Issues    Follow up on hypertension   1) HTN:  taking metoprolol  37.5 mg daily  amlodipine 5 mg bid,  and telmisartan 80 m g daily .  Readings at home systolic has been > 150.  Pulse in the 60's  until last week.  For the last week readings have been < 140/80   2) bACK PAIN  right sided ,  started Saturday.  Using tylenol .  Advised to use a heating pad.  Has recently changed residence  from independent to A/L at [facility] and is not as active as previously   3) HLD:  managed with pravastatin due to history of intolerance /myalgias with more potent statins.  Reviewed 2020 carotid dopplers noting  < 50% stenosis   Outpatient Medications Prior to Visit  Medication Sig Dispense Refill   amLODipine (NORVASC) 5 MG tablet TAKE ONE (1) TABLET BY MOUTH TWO TIMES PER DAY 180 tablet 2   Calcium Carbonate-Vitamin D 600-200 MG-UNIT TABS Take by mouth.       clopidogrel (PLAVIX) 75 MG tablet TAKE 1 TABLET BY MOUTH DAILY 90 tablet 3   fish oil-omega-3 fatty acids 1000 MG capsule Take by mouth daily.       latanoprost (XALATAN) 0.005 % ophthalmic solution daily.     metoprolol succinate (TOPROL-XL) 25 MG 24 hr tablet TAKE ONE AND ONE-HALF TABLET BY MOUTH EVERY DAY 135 tablet 0   Multiple Vitamin (MULTIVITAMIN) tablet Take 1 tablet by mouth daily.       Multiple Vitamins-Minerals (EYE VITAMINS PO) Take 1 tablet by mouth daily.     telmisartan (MICARDIS) 80 MG tablet Take 1 tablet (80 mg total) by mouth daily. 90 tablet 1   pravastatin (PRAVACHOL) 20 MG tablet TAKE 1 TABLET BY MOUTH DAILY 90 tablet 1   No  facility-administered medications prior to visit.    Review of Systems;  Patient denies headache, fevers, malaise, unintentional weight loss, skin rash, eye pain, sinus congestion and sinus pain, sore throat, dysphagia,  hemoptysis , cough, dyspnea, wheezing, chest pain, palpitations, orthopnea, edema, abdominal pain, nausea, melena, diarrhea, constipation, flank pain, dysuria, hematuria, urinary  Frequency, nocturia, numbness, tingling, seizures,  Focal weakness, Loss of consciousness,  Tremor, insomnia, depression, anxiety, and suicidal ideation.      Objective:  BP 133/62   Pulse 66   Ht 5\' 1"  (1.549 m)   Wt 133 lb 9.6 oz (60.6 kg)   SpO2 94%   BMI 25.24 kg/m   BP Readings from Last 3 Encounters:  04/16/23 133/62  01/02/23 130/72  11/29/22 139/73    Wt Readings from Last 3 Encounters:  04/16/23 133 lb 9.6 oz (60.6 kg)  01/02/23 134 lb 6.4 oz (61 kg)  11/29/22 132 lb 12.8 oz (60.2 kg)    Physical Exam Vitals reviewed.  Constitutional:      General: She is not in acute distress.    Appearance: Normal appearance. She is normal weight. She is not ill-appearing, toxic-appearing or diaphoretic.  HENT:     Head: Normocephalic.  Eyes:     General: No scleral  icterus.       Right eye: No discharge.        Left eye: No discharge.     Conjunctiva/sclera: Conjunctivae normal.  Cardiovascular:     Rate and Rhythm: Normal rate and regular rhythm.     Heart sounds: Normal heart sounds.  Pulmonary:     Effort: Pulmonary effort is normal. No respiratory distress.     Breath sounds: Normal breath sounds.  Musculoskeletal:        General: Normal range of motion.  Skin:    General: Skin is warm and dry.  Neurological:     General: No focal deficit present.     Mental Status: She is alert and oriented to person, place, and time. Mental status is at baseline.  Psychiatric:        Mood and Affect: Mood normal.        Behavior: Behavior normal.        Thought Content: Thought  content normal.        Judgment: Judgment normal.    Lab Results  Component Value Date   HGBA1C 5.9 01/02/2023   HGBA1C 5.9 04/19/2022    Lab Results  Component Value Date   CREATININE 1.04 01/02/2023   CREATININE 1.13 04/19/2022   CREATININE 1.10 04/18/2021    Lab Results  Component Value Date   WBC 7.8 04/19/2022   HGB 15.1 (H) 04/19/2022   HCT 45.1 04/19/2022   PLT 202.0 04/19/2022   GLUCOSE 95 01/02/2023   CHOL 258 (H) 01/02/2023   TRIG 243.0 (H) 01/02/2023   HDL 45.10 01/02/2023   LDLDIRECT 160.0 01/02/2023   LDLCALC 184 (H) 04/19/2022   ALT 12 01/02/2023   AST 19 01/02/2023   NA 138 01/02/2023   K 4.2 01/02/2023   CL 105 01/02/2023   CREATININE 1.04 01/02/2023   BUN 21 01/02/2023   CO2 28 01/02/2023   TSH 4.04 04/19/2022   HGBA1C 5.9 01/02/2023   MICROALBUR 2.1 (H) 04/19/2022    MR Brain Wo Contrast  Result Date: 12/02/2018 CLINICAL DATA:  Episode of dysarthria on 11/24/2018. EXAM: MRI HEAD WITHOUT CONTRAST TECHNIQUE: Multiplanar, multiecho pulse sequences of the brain and surrounding structures were obtained without intravenous contrast. COMPARISON:  None. FINDINGS: Brain: There is no evidence of acute infarct, intracranial hemorrhage, mass, midline shift, or extra-axial fluid collection. Cerebral white matter T2 hyperintensities are nonspecific but compatible with mild chronic small vessel ischemic disease. Cerebral atrophy is at most mildly advanced for age. Vascular: Major intracranial vascular flow voids are preserved. Skull and upper cervical spine: Unremarkable bone marrow signal. Sinuses/Orbits: Unremarkable orbits. Paranasal sinuses and mastoid air cells are clear. Other: None. IMPRESSION: 1. No evidence of acute or subacute infarct. 2. Mild chronic small vessel ischemic disease. Electronically Signed   By: Sebastian Ache M.D.   On: 12/02/2018 08:44    Assessment & Plan:  .Primary hypertension Assessment & Plan: Reviewed home readings on current 3 drug  therapy .  Readigs for the lat week have been < 140/80.  No changes today   Orders: -     Microalbumin / creatinine urine ratio; Future -     Comprehensive metabolic panel; Future  Mixed hyperlipidemia -     Lipid panel; Future -     LDL cholesterol, direct; Future  Statin intolerance Assessment & Plan: She is tolerating pravastatin    Stage 3b chronic kidney disease (HCC) Assessment & Plan: Renal function has been stable  with avoidance of NSAIDs.  She  is on an ARB for control of hypertension,, and statin for control of hyperlipidemia.    Other orders -     Pravastatin Sodium; Take 1 tablet (20 mg total) by mouth daily.  Dispense: 90 tablet; Refill: 1     I provided 32 minutes of face-to-face time during this encounter reviewing patient's last visit with me, patient's  most recent visit with cardiology,  nephrology,  and neurology,  recent surgical and non surgical procedures, previous  labs and imaging studies, counseling on currently addressed issues,  and post visit ordering to diagnostics and therapeutics .   Follow-up: No follow-ups on file.   Sherlene Shams, MD

## 2023-04-16 NOTE — Assessment & Plan Note (Signed)
Renal function has been stable  with avoidance of NSAIDs.  She is on an ARB for control of hypertension,, and statin for control of hyperlipidemia.

## 2023-04-16 NOTE — Telephone Encounter (Signed)
Pt was seen in office today

## 2023-04-16 NOTE — Assessment & Plan Note (Signed)
Reviewed home readings on current 3 drug therapy .  Readigs for the lat week have been < 140/80.  No changes today

## 2023-04-17 ENCOUNTER — Emergency Department
Admission: EM | Admit: 2023-04-17 | Discharge: 2023-04-17 | Disposition: A | Discharge status: Home / Self Care | Payer: Medicare Other | Attending: Emergency Medicine | Admitting: Emergency Medicine

## 2023-04-17 ENCOUNTER — Other Ambulatory Visit: Payer: Self-pay

## 2023-04-17 DIAGNOSIS — I10 Essential (primary) hypertension: Secondary | ICD-10-CM | POA: Diagnosis not present

## 2023-04-17 DIAGNOSIS — N12 Tubulo-interstitial nephritis, not specified as acute or chronic: Secondary | ICD-10-CM | POA: Diagnosis not present

## 2023-04-17 DIAGNOSIS — R109 Unspecified abdominal pain: Secondary | ICD-10-CM | POA: Diagnosis present

## 2023-04-17 DIAGNOSIS — I2510 Atherosclerotic heart disease of native coronary artery without angina pectoris: Secondary | ICD-10-CM | POA: Insufficient documentation

## 2023-04-17 LAB — URINALYSIS, W/ REFLEX TO CULTURE (INFECTION SUSPECTED)
Bilirubin Urine: NEGATIVE
Glucose, UA: NEGATIVE mg/dL
Hgb urine dipstick: NEGATIVE
Ketones, ur: 5 mg/dL — AB
Leukocytes,Ua: NEGATIVE
Nitrite: POSITIVE — AB
Protein, ur: NEGATIVE mg/dL
Specific Gravity, Urine: 1.015 (ref 1.005–1.030)
pH: 6 (ref 5.0–8.0)

## 2023-04-17 LAB — CBC
HCT: 42.7 % (ref 36.0–46.0)
Hemoglobin: 13.9 g/dL (ref 12.0–15.0)
MCH: 31.2 pg (ref 26.0–34.0)
MCHC: 32.6 g/dL (ref 30.0–36.0)
MCV: 96 fL (ref 80.0–100.0)
Platelets: 196 10*3/uL (ref 150–400)
RBC: 4.45 MIL/uL (ref 3.87–5.11)
RDW: 14.4 % (ref 11.5–15.5)
WBC: 7.9 10*3/uL (ref 4.0–10.5)
nRBC: 0 % (ref 0.0–0.2)

## 2023-04-17 LAB — BASIC METABOLIC PANEL
Anion gap: 9 (ref 5–15)
BUN: 22 mg/dL (ref 8–23)
CO2: 22 mmol/L (ref 22–32)
Calcium: 9.2 mg/dL (ref 8.9–10.3)
Chloride: 107 mmol/L (ref 98–111)
Creatinine, Ser: 0.89 mg/dL (ref 0.44–1.00)
GFR, Estimated: 59 mL/min — ABNORMAL LOW (ref >60)
Glucose, Bld: 95 mg/dL (ref 70–99)
Potassium: 4.3 mmol/L (ref 3.5–5.1)
Sodium: 138 mmol/L (ref 135–145)

## 2023-04-17 MED ORDER — CEFIXIME 400 MG PO CAPS: 400.0000 mg | ORAL_CAPSULE | Freq: Every day | ORAL | 0 refills | Status: AC

## 2023-04-17 MED ORDER — CEFTRIAXONE SODIUM 1 G IJ SOLR
1.0000 g | Freq: Once | INTRAMUSCULAR | Status: AC
  Administered 2023-04-17: 1 g via INTRAVENOUS
  Filled 2023-04-17: qty 10

## 2023-04-17 NOTE — Discharge Instructions (Addendum)
Take antibiotics for the full 10-day course as prescribed.  Take acetaminophen 650 mg and ibuprofen 400 mg every 6 hours for pain.  Take with food.  As we spoke about in the emergency department today, if you develop any worsening in pain, fevers, or any other unexpected symptoms or worsening, call your doctor right away or come back to the emergency department for reevaluation.  Thank you for choosing [place] for your health care today!  Please see your primary doctor this week for a follow up appointment.   If you have any new, worsening, or unexpected symptoms call your doctor right away or come back to the emergency department for reevaluation.  It was my pleasure to care for you today.   Daneil Dan Modesto Charon, MD

## 2023-04-17 NOTE — ED Triage Notes (Signed)
Pt arrives via POV. Pt reports right lower/mid back pain since Saturday and burning with urination since yesterday. PT concerned for a UTI. Pt is AxOx4. NAD.

## 2023-04-17 NOTE — ED Provider Notes (Signed)
[facility] Provider Note    Event Date/Time   First MD Initiated Contact with Patient 04/17/23 1426     (approximate)   History   Back Pain and Dysuria   HPI  Reneisha I Pruyn is a 89 y.o. female   Past medical history of CAD, hypertension hyperlipidemia, recent urinary tract infection who presents to the emergency department with dysuria and right flank pain.  Worsening over the last several days.  No fevers no chills no abdominal pain.  She recently completed a course of antibiotics for urinary tract infection that had completely resolved.  No history of kidney stones.  Independent Historian contributed to assessment above: Daughter is at bedside to corroborate information past medical history as above      Physical Exam   Triage Vital Signs: ED Triage Vitals  Encounter Vitals Group     BP 04/17/23 1131 (!) 152/63     Systolic BP Percentile --      Diastolic BP Percentile --      Pulse Rate 04/17/23 1131 72     Resp 04/17/23 1131 18     Temp 04/17/23 1131 98 F (36.7 C)     Temp src --      SpO2 04/17/23 1131 94 %     Weight 04/17/23 1133 133 lb (60.3 kg)     Height 04/17/23 1133 5\' 1"  (1.549 m)     Head Circumference --      Peak Flow --      Pain Score 04/17/23 1133 10     Pain Loc --      Pain Education --      Exclude from Growth Chart --     Most recent vital signs: Vitals:   04/17/23 1500 04/17/23 1519  BP: (!) 172/58   Pulse: 80   Resp: 17   Temp:  98.1 F (36.7 C)  SpO2: 96%     General: Awake, no distress.  CV:  Good peripheral perfusion.  Resp:  Normal effort.  Abd:  No distention.  Other:  Right CVA tenderness.  Soft nontender abdomen D palpation all quadrants.  Hypertensive otherwise vital signs normal, afebrile.   ED Results / Procedures / Treatments   Labs (all labs ordered are listed, but only abnormal results are displayed) Labs Reviewed  BASIC METABOLIC PANEL - Abnormal; Notable for the following  components:      Result Value   GFR, Estimated 59 (*)    All other components within normal limits  URINALYSIS, W/ REFLEX TO CULTURE (INFECTION SUSPECTED) - Abnormal; Notable for the following components:   Color, Urine AMBER (*)    APPearance CLEAR (*)    Ketones, ur 5 (*)    Nitrite POSITIVE (*)    Bacteria, UA RARE (*)    All other components within normal limits  URINE CULTURE  CBC     I ordered and reviewed the above labs they are notable for nitrite positive bacteriuria.  White blood cell count is normal.  PROCEDURES:  Critical Care performed: No  Procedures   MEDICATIONS ORDERED IN ED: Medications  cefTRIAXone (ROCEPHIN) 1 g in sodium chloride 0.9 % 100 mL IVPB (1 g Intravenous New Bag/Given 04/17/23 1517)    IMPRESSION / MDM / ASSESSMENT AND PLAN / ED COURSE  I reviewed the triage vital signs and the nursing notes.  Patient's presentation is most consistent with acute presentation with potential threat to life or bodily function.  Differential diagnosis includes, but is not limited to, pyelonephritis, urinary tract infection, renal colic, considered but less likely sepsis   The patient is on the cardiac monitor to evaluate for evidence of arrhythmia and/or significant heart rate changes.  MDM:    Nitrate positive bacteria in this patient with urinary symptoms as well as right sided flank pain concerning for pyelonephritis.  Fortunately her vital signs are normal aside for some hypertension, she is afebrile, no leukocytosis, appears nontoxic overall doubt sepsis.  Given her age and comorbidities I offered hospital admission for IV antibiotic treatment of pyelonephritis but patient defers at this time and agrees for 1 dose of IV Rocephin here and outpatient management.  She understands to return to the emergency department for reevaluation further treatment if she is to worsen in any way.        FINAL CLINICAL IMPRESSION(S) / ED  DIAGNOSES   Final diagnoses:  Pyelonephritis     Rx / DC Orders   ED Discharge Orders          Ordered    cefixime (SUPRAX) 400 MG CAPS capsule  Daily        04/17/23 1504             Note:  This document was prepared using Dragon voice recognition software and may include unintentional dictation errors.    Pilar Jarvis, MD 04/17/23 1536

## 2023-04-18 ENCOUNTER — Ambulatory Visit: Payer: Medicare Other | Admitting: Emergency Medicine

## 2023-04-18 VITALS — Ht 61.0 in | Wt 133.0 lb

## 2023-04-18 DIAGNOSIS — Z0000 Encounter for general adult medical examination without abnormal findings: Secondary | ICD-10-CM

## 2023-04-18 LAB — URINE CULTURE: Culture: <10000 — AB

## 2023-04-18 NOTE — Progress Notes (Signed)
Subjective:   [name] is a 89 y.o. female who presents for Medicare Annual (Subsequent) preventive examination.  Visit Complete: Virtual I connected with  [name] on 04/18/23 by a audio enabled telemedicine application and verified that I am speaking with the correct person using two identifiers.  Patient Location: Home  Provider Location: Home Office  I discussed the limitations of evaluation and management by telemedicine. The patient expressed understanding and agreed to proceed.  Vital Signs: Because this visit was a virtual/telehealth visit, some criteria may be missing or patient reported. Any vitals not documented were not able to be obtained and vitals that have been documented are patient reported.   Cardiac Risk Factors include: advanced age (>16men, >30 women);dyslipidemia;hypertension     Objective:    Today's Vitals   04/18/23 1023  Weight: 133 lb (60.3 kg)  Height: 5\' 1"  (1.549 m)  PainSc: 8    Body mass index is 25.13 kg/m.     04/18/2023   10:35 AM 04/17/2023   11:30 AM 04/10/2022    9:15 AM 04/08/2021    9:16 AM 04/07/2020    9:13 AM 04/07/2019    9:24 AM 04/03/2018    9:15 AM  Advanced Directives  Does Patient Have a Medical Advance Directive? Yes Yes Yes Yes Yes Yes Yes  Type of Estate agent of [place];Living will Healthcare Power of [place];Living will Healthcare Power of [place];Living will Healthcare Power of [place];Living will Healthcare Power of [place];Living will Healthcare Power of [employer] of [place];Living will  Does patient want to make changes to medical advance directive? No - Patient declined  No - Patient declined No - Patient declined No - Patient declined No - Patient declined No - Patient declined  Copy of Healthcare Power of Attorney in Chart? No - copy requested  No - copy requested No - copy requested No - copy requested No - copy requested No - copy requested    Current  Medications (verified) Outpatient Encounter Medications as of 04/18/2023  Medication Sig   amLODipine (NORVASC) 5 MG tablet TAKE ONE (1) TABLET BY MOUTH TWO TIMES PER DAY   Calcium Carbonate-Vitamin D 600-200 MG-UNIT TABS Take by mouth.     cefixime (SUPRAX) 400 MG CAPS capsule Take 1 capsule (400 mg total) by mouth daily for 10 days.   clopidogrel (PLAVIX) 75 MG tablet TAKE 1 TABLET BY MOUTH DAILY   fish oil-omega-3 fatty acids 1000 MG capsule Take by mouth daily.     latanoprost (XALATAN) 0.005 % ophthalmic solution daily.   metoprolol succinate (TOPROL-XL) 25 MG 24 hr tablet TAKE ONE AND ONE-HALF TABLET BY MOUTH EVERY DAY   Multiple Vitamin (MULTIVITAMIN) tablet Take 1 tablet by mouth daily.     Multiple Vitamins-Minerals (EYE VITAMINS PO) Take 1 tablet by mouth daily.   pravastatin (PRAVACHOL) 20 MG tablet Take 1 tablet (20 mg total) by mouth daily.   telmisartan (MICARDIS) 80 MG tablet Take 1 tablet (80 mg total) by mouth daily.   No facility-administered encounter medications on file as of 04/18/2023.    Allergies (verified) Contrast media [iodinated contrast media] and Sulfa antibiotics   History: Past Medical History:  Diagnosis Date   Carotid artery disease (HCC)    Coronary artery disease, non-occlusive    Hyperlipidemia    Hypertension    Left bundle branch block    Nonischemic cardiomyopathy (HCC)    Past Surgical History:  Procedure Laterality Date   ABDOMINAL HYSTERECTOMY  ankle     rods in both ankles   BREAST BIOPSY Bilateral YRS AGO   NEG   CARDIAC CATHETERIZATION     [facility]   left ankle  Octo 2012   s/p pinning , [place]raz[place]   LEFT HEART CATHETERIZATION WITH CORONARY ANGIOGRAM N/A 10/31/2012   Procedure: LEFT HEART CATHETERIZATION WITH CORONARY ANGIOGRAM;  Surgeon: Robynn Pane, MD;  Location: [facility] CATH LAB;  Service: Cardiovascular;  Laterality: N/A;   Family History  Problem Relation Age of Onset   Hypertension Mother    Heart attack Mother     Hypertension Father    Cancer Father        stomach   Heart attack Brother    Stroke Sister    Breast cancer Neg Hx    Social History   Socioeconomic History   Marital status: Widowed    Spouse name: Not on file   Number of children: 1   Years of education: Not on file   Highest education level: Not on file  Occupational History   Occupation: retired  Tobacco Use   Smoking status: Never   Smokeless tobacco: Never  Vaping Use   Vaping status: Never Used  Substance and Sexual Activity   Alcohol use: No   Drug use: No   Sexual activity: Not Currently  Other Topics Concern   Not on file  Social History Narrative   Moved to retirement home 03/2023. Has 1 daughter.   Social Determinants of Health   Financial Resource Strain: Low Risk  (04/18/2023)   Overall Financial Resource Strain (CARDIA)    Difficulty of Paying Living Expenses: Not hard at all  Food Insecurity: No Food Insecurity (04/18/2023)   Hunger Vital Sign    Worried About Running Out of Food in the Last Year: Never true    Ran Out of Food in the Last Year: Never true  Transportation Needs: No Transportation Needs (04/18/2023)   PRAPARE - Administrator, Civil Service (Medical): No    Lack of Transportation (Non-Medical): No  Physical Activity: Insufficiently Active (04/18/2023)   Exercise Vital Sign    Days of Exercise per Week: 1 day    Minutes of Exercise per Session: 20 min  Stress: No Stress Concern Present (04/18/2023)   [employer] of Occupational Health - Occupational Stress Questionnaire    Feeling of Stress : Not at all  Social Connections: Moderately Isolated (04/18/2023)   Social Connection and Isolation Panel [NHANES]    Frequency of Communication with Friends and Family: More than three times a week    Frequency of Social Gatherings with Friends and Family: More than three times a week    Attends Religious Services: More than 4 times per year    Active Member of [employer] or  Organizations: No    Attends Banker Meetings: Never    Marital Status: Widowed    Tobacco Counseling Counseling given: Not Answered   Clinical Intake:  Pre-visit preparation completed: Yes  Pain Score: 8  Pain Type: Acute pain Pain Location: Back     BMI - recorded: 25.13 Nutritional Status: BMI 25 -29 Overweight Nutritional Risks: None Diabetes: No  How often do you need to have someone help you when you read instructions, pamphlets, or other written materials from your doctor or pharmacy?: 1 - Never  Interpreter Needed?: No  Information entered by :: Tora Kindred, CMa   Activities of Daily Living    04/18/2023   10:27 AM  In your present state of health, do you have any difficulty performing the following activities:  Hearing? 0  Vision? 0  Difficulty concentrating or making decisions? 0  Walking or climbing stairs? 1  Comment uses a cane or a walker  Dressing or bathing? 0  Doing errands, shopping? 0  Comment still drives  Preparing Food and eating ? N  Comment lives in retirement home and all meals are provided for her  Using the Toilet? N  In the past six months, have you accidently leaked urine? N  Do you have problems with loss of bowel control? N  Managing your Medications? N  Managing your Finances? N  Housekeeping or managing your Housekeeping? Y  Comment has someone to clean house    Patient Care Team: Sherlene Shams, MD as PCP - General (Internal Medicine) Iran Ouch, MD as PCP - Cardiology (Cardiology)  Indicate any recent Medical Services you may have received from other than [facility] providers in the past year (date may be approximate).     Assessment:   This is a routine wellness examination for Joniece.  Hearing/Vision screen Hearing Screening - Comments:: Denies hearing loss Vision Screening - Comments:: Gets eye exams   Goals Addressed               This Visit's Progress     Patient Stated (pt-stated)         Read more books as long as eyesight continues to be good      Depression Screen    04/18/2023   10:33 AM 04/16/2023   11:00 AM 01/02/2023   10:40 AM 11/21/2022    9:13 AM 04/10/2022    8:50 AM 04/18/2021    8:10 AM 04/08/2021    9:17 AM  PHQ 2/9 Scores  PHQ - 2 Score 0 0 0 0 0 0 0  PHQ- 9 Score 0  0 0       Fall Risk    04/18/2023   10:36 AM 04/16/2023   11:00 AM 01/02/2023   10:40 AM 04/10/2022    8:50 AM 04/18/2021    8:10 AM  Fall Risk   Falls in the past year? 0 0 0 0 0  Number falls in past yr: 0 0 0 0   Injury with Fall? 0 0 0 0   Risk for fall due to : No Fall Risks No Fall Risks No Fall Risks No Fall Risks No Fall Risks  Follow up Falls prevention discussed Falls evaluation completed Falls evaluation completed Falls evaluation completed Falls evaluation completed    MEDICARE RISK AT HOME: Medicare Risk at Home Any stairs in or around the home?: No If so, are there any without handrails?: No Home free of loose throw rugs in walkways, pet beds, electrical cords, etc?: Yes Adequate lighting in your home to reduce risk of falls?: Yes Life alert?: Yes Use of a cane, walker or w/c?: Yes Grab bars in the bathroom?: Yes Shower chair or bench in shower?: No Elevated toilet seat or a handicapped toilet?: Yes  TIMED UP AND GO:  Was the test performed?  No    Cognitive Function:    03/31/2016    9:19 AM 03/31/2015   10:02 AM  MMSE - Mini Mental State Exam  Orientation to time 5 5  Orientation to Place 5 5  Registration 3 3  Attention/ Calculation 5 5  Recall 3 3  Language- name 2 objects 2 2  Language- repeat 1 1  Language- follow 3 step command 3 3  Language- read & follow direction 1 1  Write a sentence 1 1  Copy design 1 1  Total score 30 30        04/18/2023   10:37 AM 04/10/2022    9:16 AM 04/07/2019    9:19 AM 04/03/2018    9:17 AM 04/02/2017    9:31 AM  6CIT Screen  What Year? 0 points 0 points 0 points 0 points 0 points  What month?  0 points 0 points 0 points 0 points 0 points  What time? 0 points 0 points 0 points 0 points 0 points  Count back from 20 0 points 0 points 0 points 0 points 0 points  Months in reverse 2 points 0 points 0 points 0 points 2 points  Repeat phrase 0 points 0 points  0 points 0 points  Total Score 2 points 0 points  0 points 2 points    Immunizations Immunization History  Administered Date(s) Administered   Fluad Quad(high Dose 65+) 04/06/2021   Influenza Split 03/20/2011, 03/13/2012, 03/09/2014   Influenza, High Dose Seasonal PF 04/03/2018, 01/30/2019, 04/06/2021, 03/15/2022, 03/15/2022, 03/20/2023   Influenza,inj,Quad PF,6+ Mos 03/31/2015   Influenza-Unspecified 03/14/2013, 03/07/2014, 03/20/2016, 03/19/2017, 04/02/2020   PFIZER(Purple Top)SARS-COV-2 Vaccination 07/05/2019, 07/26/2019, 03/19/2020, 04/06/2022   Pneumococcal Conjugate-13 03/23/2014   Pneumococcal Polysaccharide-23 03/19/2010, 04/15/2019   Tdap 04/01/2013   Zoster Recombinant(Shingrix) 01/26/2018, 04/19/2018, 07/30/2018   Zoster, Live 03/20/2011    TDAP status: Due, Education has been provided regarding the importance of this vaccine. Advised may receive this vaccine at local pharmacy or Health Dept. Aware to provide a copy of the vaccination record if obtained from local pharmacy or Health Dept. Verbalized acceptance and understanding.  Flu Vaccine status: Up to date  Pneumococcal vaccine status: Up to date  Covid-19 vaccine status: Declined, Education has been provided regarding the importance of this vaccine but patient still declined. Advised may receive this vaccine at local pharmacy or Health Dept.or vaccine clinic. Aware to provide a copy of the vaccination record if obtained from local pharmacy or Health Dept. Verbalized acceptance and understanding.  Qualifies for Shingles Vaccine? Yes   Zostavax completed No   Shingrix Completed?: Yes  Screening Tests Health Maintenance  Topic Date Due   COVID-19 Vaccine  (5 - 2023-24 season) 02/18/2023   DTaP/Tdap/Td (2 - Td or Tdap) 04/02/2023   HEMOGLOBIN A1C  07/05/2023   Medicare Annual Wellness (AWV)  04/17/2024   Pneumonia Vaccine 24+ Years old  Completed   INFLUENZA VACCINE  Completed   Zoster Vaccines- Shingrix  Completed   HPV VACCINES  Aged Out   DEXA SCAN  Discontinued    Health Maintenance  Health Maintenance Due  Topic Date Due   COVID-19 Vaccine (5 - 2023-24 season) 02/18/2023   DTaP/Tdap/Td (2 - Td or Tdap) 04/02/2023    Colorectal cancer screening: No longer required.   Mammogram status: No longer required due to age.  Bone Density Status: Declined  Lung Cancer Screening: (Low Dose CT Chest recommended if Age 38-80 years, 20 pack-year currently smoking OR have quit w/in 15years.) does not qualify.   Lung Cancer Screening Referral: n/a  Additional Screening:  Hepatitis C Screening: does not qualify;   Vision Screening: Recommended annual ophthalmology exams for early detection of glaucoma and other disorders of the eye. Dental Screening: Recommended annual dental exams for proper oral hygiene   Community Resource Referral / Chronic Care Management: CRR required this visit?  No  CCM required this visit?  No     Plan:     I have personally reviewed and noted the following in the patient's chart:   Medical and social history Use of alcohol, tobacco or illicit drugs  Current medications and supplements including opioid prescriptions. Patient is not currently taking opioid prescriptions. Functional ability and status Nutritional status Physical activity Advanced directives List of other physicians Hospitalizations, surgeries, and ER visits in previous 12 months Vitals Screenings to include cognitive, depression, and falls Referrals and appointments  In addition, I have reviewed and discussed with patient certain preventive protocols, quality metrics, and best practice recommendations. A written personalized  care plan for preventive services as well as general preventive health recommendations were provided to patient.     Tora Kindred, CMA   04/18/2023   After Visit Summary: (Pick Up) Due to this being a telephonic visit, with patients personalized plan was offered to patient and patient has requested to Pick up at office.  Nurse Notes:  6 CIT Score - 2 Needs Tdap vaccine

## 2023-04-18 NOTE — Patient Instructions (Addendum)
[name] , Thank you for taking time to come for your Medicare Wellness Visit. I appreciate your ongoing commitment to your health goals. Please review the following plan we discussed and let me know if I can assist you in the future.   Referrals/Orders/Follow-Ups/Clinician Recommendations: Get a tetanus vaccine at your earliest convenience. Keep up the good work!  This is a list of the screening recommended for you and due dates:  Health Maintenance  Topic Date Due   COVID-19 Vaccine (5 - 2023-24 season) 02/18/2023   DTaP/Tdap/Td vaccine (2 - Td or Tdap) 04/02/2023   Hemoglobin A1C  07/05/2023   Medicare Annual Wellness Visit  04/17/2024   Pneumonia Vaccine  Completed   Flu Shot  Completed   Zoster (Shingles) Vaccine  Completed   HPV Vaccine  Aged Out   DEXA scan (bone density measurement)  Discontinued    Advanced directives: (Copy Requested) Please bring a copy of your health care power of attorney and living will to the office to be added to your chart at your convenience.  Next Medicare Annual Wellness Visit scheduled for next year: Yes, 04/18/24 @ 11:15am

## 2023-04-19 ENCOUNTER — Emergency Department
Admission: EM | Admit: 2023-04-19 | Discharge: 2023-04-19 | Disposition: A | Discharge status: Home / Self Care | Payer: Medicare Other | Attending: Emergency Medicine | Admitting: Emergency Medicine

## 2023-04-19 ENCOUNTER — Emergency Department: Payer: Medicare Other

## 2023-04-19 ENCOUNTER — Other Ambulatory Visit: Payer: Self-pay

## 2023-04-19 ENCOUNTER — Telehealth: Payer: Self-pay

## 2023-04-19 DIAGNOSIS — M545 Low back pain, unspecified: Secondary | ICD-10-CM | POA: Insufficient documentation

## 2023-04-19 DIAGNOSIS — M4856XA Collapsed vertebra, not elsewhere classified, lumbar region, initial encounter for fracture: Secondary | ICD-10-CM | POA: Diagnosis not present

## 2023-04-19 DIAGNOSIS — M5459 Other low back pain: Secondary | ICD-10-CM | POA: Diagnosis not present

## 2023-04-19 DIAGNOSIS — N200 Calculus of kidney: Secondary | ICD-10-CM | POA: Insufficient documentation

## 2023-04-19 DIAGNOSIS — M47816 Spondylosis without myelopathy or radiculopathy, lumbar region: Secondary | ICD-10-CM | POA: Diagnosis not present

## 2023-04-19 DIAGNOSIS — I700 Atherosclerosis of aorta: Secondary | ICD-10-CM | POA: Insufficient documentation

## 2023-04-19 DIAGNOSIS — R109 Unspecified abdominal pain: Secondary | ICD-10-CM | POA: Diagnosis not present

## 2023-04-19 DIAGNOSIS — M4316 Spondylolisthesis, lumbar region: Secondary | ICD-10-CM | POA: Diagnosis not present

## 2023-04-19 DIAGNOSIS — R29890 Loss of height: Secondary | ICD-10-CM | POA: Diagnosis not present

## 2023-04-19 LAB — CBC WITH DIFFERENTIAL/PLATELET
Abs Immature Granulocytes: 0.01 10*3/uL (ref 0.00–0.07)
Basophils Absolute: 0 10*3/uL (ref 0.0–0.1)
Basophils Relative: 1 %
Eosinophils Absolute: 0.2 10*3/uL (ref 0.0–0.5)
Eosinophils Relative: 3 %
HCT: 43.3 % (ref 36.0–46.0)
Hemoglobin: 14.4 g/dL (ref 12.0–15.0)
Immature Granulocytes: 0 %
Lymphocytes Relative: 21 %
Lymphs Abs: 1.3 10*3/uL (ref 0.7–4.0)
MCH: 31.3 pg (ref 26.0–34.0)
MCHC: 33.3 g/dL (ref 30.0–36.0)
MCV: 94.1 fL (ref 80.0–100.0)
Monocytes Absolute: 0.8 10*3/uL (ref 0.1–1.0)
Monocytes Relative: 12 %
Neutro Abs: 4.2 10*3/uL (ref 1.7–7.7)
Neutrophils Relative %: 63 %
Platelets: 215 10*3/uL (ref 150–400)
RBC: 4.6 MIL/uL (ref 3.87–5.11)
RDW: 14.2 % (ref 11.5–15.5)
WBC: 6.5 10*3/uL (ref 4.0–10.5)
nRBC: 0 % (ref 0.0–0.2)

## 2023-04-19 LAB — BASIC METABOLIC PANEL
Anion gap: 10 (ref 5–15)
BUN: 23 mg/dL (ref 8–23)
CO2: 24 mmol/L (ref 22–32)
Calcium: 9.3 mg/dL (ref 8.9–10.3)
Chloride: 103 mmol/L (ref 98–111)
Creatinine, Ser: 0.84 mg/dL (ref 0.44–1.00)
GFR, Estimated: >60 mL/min (ref >60)
Glucose, Bld: 124 mg/dL — ABNORMAL HIGH (ref 70–99)
Potassium: 4 mmol/L (ref 3.5–5.1)
Sodium: 137 mmol/L (ref 135–145)

## 2023-04-19 MED ORDER — LIDOCAINE 5 % EX PTCH: 1.0000 | MEDICATED_PATCH | CUTANEOUS | 0 refills | Status: AC

## 2023-04-19 MED ORDER — METHOCARBAMOL 500 MG PO TABS
500.0000 mg | ORAL_TABLET | Freq: Once | ORAL | Status: AC
Start: 2023-04-19 — End: 2023-04-19
  Administered 2023-04-19: 500 mg via ORAL
  Filled 2023-04-19: qty 1

## 2023-04-19 MED ORDER — METHOCARBAMOL 500 MG PO TABS: 500.0000 mg | ORAL_TABLET | Freq: Three times a day (TID) | ORAL | 0 refills | Status: DC | PRN

## 2023-04-19 MED ORDER — LIDOCAINE 5 % EX PTCH
1.0000 | MEDICATED_PATCH | Freq: Once | CUTANEOUS | Status: DC
  Administered 2023-04-19: 1 via TRANSDERMAL
  Filled 2023-04-19: qty 1

## 2023-04-19 NOTE — Discharge Instructions (Addendum)
You were seen today for your right lower back pain.  Imaging and laboratory workup is reassuring at this time.  Please continue taking your antibiotic as prescribed.  I have also sent some medications to help with what I suspect is a muscular strain in your right lower back.  Please pick these up from your pharmacy and take as prescribed.  Separately, you can briefly take ibuprofen over the next couple of days.  You can take 2 to 400 mg twice daily for the next 2 to 3 days.  Do not continue to take it past that time.

## 2023-04-19 NOTE — Telephone Encounter (Signed)
[name], daughter arrived at office asking to speak with Dr. Darrick Huntsman or Shanda Bumps.  Writer assisted due to [place] busy with pt care.  Pt seen in ED 04/17/23 with Pyelonephritis.  Was given dose of IV antibiotics in ED per daughter and has had 3 doses of cefixime 400mg  at home.  Daughter reports that pt is still in severe pain in her lower back.  She describes it as sharpe and stabbing.  The pain is impacting her ability to walk to the bathroom alone.  Pt currently lives at [facility].  After talking with [name] in office was able to call patient.  She reports that her urine is yellow and she is urinating the "normal amount of times" daily.  She denies seeing any evidence of blood.  Patient has tried taking tylenol with no relief.  Please advise on next steps for patient.  Daughter is very concerned.

## 2023-04-19 NOTE — ED Provider Notes (Signed)
[facility] Provider Note    Event Date/Time   First MD Initiated Contact with Patient 04/19/23 1837     (approximate)   History   Back Pain   HPI Kelli I Paulhus is a 89 y.o. female who presents today for right lower back pain.  Patient states she was seen in the emergency department 2 days ago and was diagnosed with a urinary tract infection.  She has had ongoing pain symptoms in her right lower back.  Most notable when she gets up to move or twist her trunk.  Denies fevers, chills, nausea, vomiting, chest pain, shortness of breath, diarrhea, constipation, dysuria.  She has been taking her oral antibiotic as prescribed with no missed doses.  Has taken Tylenol with some relief of pain symptoms.  Denies any new falls.  Chart review: Reviewed most recent emergency room visit with diagnosis of pyelonephritis     Physical Exam   Triage Vital Signs: ED Triage Vitals [04/19/23 1416]  Encounter Vitals Group     BP 131/63     Systolic BP Percentile      Diastolic BP Percentile      Pulse Rate 71     Resp 16     Temp 98.4 F (36.9 C)     Temp Source Oral     SpO2 95 %     Weight      Height 5\' 2"  (1.575 m)     Head Circumference      Peak Flow      Pain Score 5     Pain Loc      Pain Education      Exclude from Growth Chart     Most recent vital signs: Vitals:   04/19/23 1416 04/19/23 1845  BP: 131/63 130/66  Pulse: 71 70  Resp: 16 16  Temp: 98.4 F (36.9 C) 98.3 F (36.8 C)  SpO2: 95% 96%   I have reviewed the vital signs. General:  Awake, alert, no acute distress. Head:  Normocephalic, Atraumatic. EENT:  PERRL, EOMI, Oral mucosa pink and moist, Neck is supple. Cardiovascular: Regular rate, 2+ distal pulses. Respiratory:  Normal respiratory effort, symmetrical expansion, no distress.   Extremities:  Moving all four extremities through full ROM without pain.  No tenderness palpation elicited to C, T, or L-spine.  No tenderness palpation in  the lumbar paraspinal muscles.  Pain exhibited when twisting the trunk or attempting to stand. Abdomen: No tenderness palpation throughout abdomen, soft, nondistended.  No CVA tenderness bilaterally. Neuro:  Alert and oriented.  Interacting appropriately.   Skin:  Warm, dry, no rash.   Psych: Appropriate affect.    ED Results / Procedures / Treatments   Labs (all labs ordered are listed, but only abnormal results are displayed) Labs Reviewed  BASIC METABOLIC PANEL - Abnormal; Notable for the following components:      Result Value   Glucose, Bld 124 (*)    All other components within normal limits  CBC WITH DIFFERENTIAL/PLATELET  URINALYSIS, ROUTINE W REFLEX MICROSCOPIC     EKG    RADIOLOGY Independently interpreted CT imaging and agree with radiology read   PROCEDURES:  Critical Care performed: No  Procedures   MEDICATIONS ORDERED IN ED: Medications  lidocaine (LIDODERM) 5 % 1 patch (1 patch Transdermal Patch Applied 04/19/23 1912)  methocarbamol (ROBAXIN) tablet 500 mg (500 mg Oral Given 04/19/23 1911)     IMPRESSION / MDM / ASSESSMENT AND PLAN / ED COURSE  I reviewed the triage vital signs and the nursing notes.                              Differential diagnosis includes, but is not limited to, pyelonephritis, lumbar spine injury, appendicitis, nephrolithiasis, musculoskeletal strain  Patient's presentation is most consistent with acute complicated illness / injury requiring diagnostic workup.  Patient is a 89 year old female presenting today for right lower back pain.  Pain symptoms appear to be exacerbated with movement such as twisting or standing.  She is stable able to walk with her walker.  Denies any new falls.  Vital signs are stable at this time patient has not missed any doses for treatment of her UTI.  She has no CVA tenderness at this time and have a low suspicion for actual pyelonephritis.  More concern for possible musculoskeletal injury.   Laboratory workup reassuring at this time with no leukocytosis and no indication of sepsis.  CT imaging was performed of the abdomen/pelvis as well as L-spine which shows no acute intra-abdominal pathology.  Low suspicion for pyelonephritis without CVA tenderness and no other acute intra-abdominal infections.  Most consistent with musculoskeletal injury which patient will be started on Robaxin and Lidoderm patches.  She was discharged on these as well told to follow-up with her PCP for ongoing pain management.  Given strict return precautions for worsening symptoms.  The patient is on the cardiac monitor to evaluate for evidence of arrhythmia and/or significant heart rate changes.     FINAL CLINICAL IMPRESSION(S) / ED DIAGNOSES   Final diagnoses:  Acute right-sided low back pain without sciatica     Rx / DC Orders   ED Discharge Orders          Ordered    methocarbamol (ROBAXIN) 500 MG tablet  Every 8 hours PRN        04/19/23 1910    lidocaine (LIDODERM) 5 %  Every 24 hours        04/19/23 1910             Note:  This document was prepared using Dragon voice recognition software and may include unintentional dictation errors.   Janith Lima, MD 04/19/23 Jerene Bears

## 2023-04-19 NOTE — Telephone Encounter (Signed)
Phone to patient and Lupita Leash, daughter.  Relayed Dr. Melina Schools recommendation for ED evaluation.  Verbalized understanding.

## 2023-04-19 NOTE — ED Notes (Signed)
See triage notes. Patient was recently treated for a UTI and c/o right lower back pain.

## 2023-04-19 NOTE — ED Notes (Signed)
Patient given discharge instructions including importance of discharge instructions including prescriptions x2 and importance of follow up appt as needed with stated understanding. Patient stable and wheeled to lobby by spouse.

## 2023-04-19 NOTE — ED Provider Triage Note (Signed)
Emergency Medicine Provider Triage Evaluation Note  [name] , a 89 y.o. female  was evaluated in triage.  Pt complains of low back pain being treated for uti.  Review of Systems  Positive:  Negative:   Physical Exam  There were no vitals taken for this visit. Gen:   Awake, no distress   Resp:  Normal effort  MSK:   Moves extremities without difficulty  Other:  Lumbar spine tender  Medical Decision Making  Medically screening exam initiated at 2:14 PM.  Appropriate orders placed.  Anise I Suh was informed that the remainder of the evaluation will be completed by another provider, this initial triage assessment does not replace that evaluation, and the importance of remaining in the ED until their evaluation is complete.     Faythe Ghee, PA-C 04/19/23 1416

## 2023-04-19 NOTE — ED Triage Notes (Signed)
Pt arrived to ED POV d/t right lower back pain and was recently tx for UTI. Pt denies any n/v or diarrhea.

## 2023-04-22 DIAGNOSIS — M4186 Other forms of scoliosis, lumbar region: Secondary | ICD-10-CM | POA: Diagnosis not present

## 2023-04-27 ENCOUNTER — Telehealth: Payer: Self-pay | Admitting: Internal Medicine

## 2023-04-27 ENCOUNTER — Other Ambulatory Visit: Payer: Self-pay

## 2023-04-27 DIAGNOSIS — M545 Low back pain, unspecified: Secondary | ICD-10-CM | POA: Diagnosis not present

## 2023-04-27 DIAGNOSIS — R399 Unspecified symptoms and signs involving the genitourinary system: Secondary | ICD-10-CM

## 2023-04-27 NOTE — Telephone Encounter (Signed)
Patient was at [facility] today. Doctor at [facility] would like patient's provider to check patient's urine before he proceeds treatment on patients back. Her number is [phone].

## 2023-05-02 ENCOUNTER — Other Ambulatory Visit (INDEPENDENT_AMBULATORY_CARE_PROVIDER_SITE_OTHER): Payer: Medicare Other

## 2023-05-02 DIAGNOSIS — R399 Unspecified symptoms and signs involving the genitourinary system: Secondary | ICD-10-CM | POA: Diagnosis not present

## 2023-05-03 DIAGNOSIS — M51379 Other intervertebral disc degeneration, lumbosacral region without mention of lumbar back pain or lower extremity pain: Secondary | ICD-10-CM | POA: Diagnosis not present

## 2023-05-03 DIAGNOSIS — M4856XA Collapsed vertebra, not elsewhere classified, lumbar region, initial encounter for fracture: Secondary | ICD-10-CM | POA: Diagnosis not present

## 2023-05-03 DIAGNOSIS — M47816 Spondylosis without myelopathy or radiculopathy, lumbar region: Secondary | ICD-10-CM | POA: Diagnosis not present

## 2023-05-03 DIAGNOSIS — M4317 Spondylolisthesis, lumbosacral region: Secondary | ICD-10-CM | POA: Diagnosis not present

## 2023-05-03 DIAGNOSIS — M545 Low back pain, unspecified: Secondary | ICD-10-CM | POA: Diagnosis not present

## 2023-05-03 DIAGNOSIS — M4854XA Collapsed vertebra, not elsewhere classified, thoracic region, initial encounter for fracture: Secondary | ICD-10-CM | POA: Diagnosis not present

## 2023-05-03 DIAGNOSIS — M51369 Other intervertebral disc degeneration, lumbar region without mention of lumbar back pain or lower extremity pain: Secondary | ICD-10-CM | POA: Diagnosis not present

## 2023-05-03 DIAGNOSIS — M48061 Spinal stenosis, lumbar region without neurogenic claudication: Secondary | ICD-10-CM | POA: Diagnosis not present

## 2023-05-03 DIAGNOSIS — N281 Cyst of kidney, acquired: Secondary | ICD-10-CM | POA: Diagnosis not present

## 2023-05-03 DIAGNOSIS — M4316 Spondylolisthesis, lumbar region: Secondary | ICD-10-CM | POA: Diagnosis not present

## 2023-05-03 LAB — URINALYSIS, ROUTINE W REFLEX MICROSCOPIC
Bilirubin Urine: NEGATIVE
Hgb urine dipstick: NEGATIVE
Leukocytes,Ua: NEGATIVE
Nitrite: NEGATIVE
Specific Gravity, Urine: 1.025 (ref 1.000–1.030)
Total Protein, Urine: 30 — AB
Urine Glucose: NEGATIVE
Urobilinogen, UA: 0.2 (ref 0.0–1.0)
pH: 6 (ref 5.0–8.0)

## 2023-05-04 ENCOUNTER — Ambulatory Visit: Payer: Medicare Other | Admitting: Internal Medicine

## 2023-05-04 NOTE — Telephone Encounter (Signed)
Pt daughter notified. Unable to get mychart. Pt's daughter stated she would wait for a call on Monday

## 2023-05-04 NOTE — Telephone Encounter (Signed)
Patient's daughter called and said she dropped off a urine sample for her mom last Wednesday. She wanted to know the update on that. Her number is [phone]. If someone can call her.

## 2023-05-05 LAB — URINE CULTURE
MICRO NUMBER:: 15725448
SPECIMEN QUALITY:: ADEQUATE

## 2023-05-07 DIAGNOSIS — M545 Low back pain, unspecified: Secondary | ICD-10-CM | POA: Diagnosis not present

## 2023-05-08 ENCOUNTER — Other Ambulatory Visit: Payer: Self-pay | Admitting: Orthopedic Surgery

## 2023-05-08 DIAGNOSIS — M545 Low back pain, unspecified: Secondary | ICD-10-CM

## 2023-05-14 ENCOUNTER — Ambulatory Visit
Admission: RE | Admit: 2023-05-14 | Discharge: 2023-05-14 | Disposition: A | Discharge status: Home / Self Care | Payer: Medicare Other | Source: Ambulatory Visit | Attending: Orthopedic Surgery | Admitting: Orthopedic Surgery

## 2023-05-14 ENCOUNTER — Other Ambulatory Visit: Payer: Self-pay | Admitting: Cardiovascular Disease

## 2023-05-14 DIAGNOSIS — M545 Low back pain, unspecified: Secondary | ICD-10-CM | POA: Diagnosis not present

## 2023-05-14 DIAGNOSIS — M439 Deforming dorsopathy, unspecified: Secondary | ICD-10-CM | POA: Diagnosis not present

## 2023-05-14 MED ORDER — TECHNETIUM TC 99M MEDRONATE IV KIT
20.0000 | PACK | Freq: Once | INTRAVENOUS | Status: AC | PRN
  Administered 2023-05-14: 21.68 via INTRAVENOUS

## 2023-05-14 NOTE — Telephone Encounter (Signed)
last visit 05/05/22 with plan to f/u in 12 months.  Next visit: none/active recall

## 2023-05-14 NOTE — Telephone Encounter (Signed)
Left voicemail to schedule follow up appt, please scheudule

## 2023-05-24 ENCOUNTER — Telehealth: Payer: Self-pay

## 2023-05-24 NOTE — Telephone Encounter (Signed)
Patient's daughter, Melina Modena (not on DPR), called to state patient had a bone scan done at [facility] on 05/14/2023.  Lupita Leash states scan has not been read yet and she is concerned that it may not be read prior to patient's upcoming appointment with Dr. Duncan Dull.  Lupita Leash states she was told by Radiology Dept at [facility], if Dr. Duncan Dull will request they expedite the results, then the scan may possibly be read before patient's upcoming appointment with Dr. Darrick Huntsman.  Lupita Leash states she has a Engineer, structural and has given it to [place], but I was unable to find it in the system. Lupita Leash states she will bring [place] another copy.

## 2023-05-24 NOTE — Telephone Encounter (Signed)
Would you like for me to call the reading room and request the results?

## 2023-05-25 NOTE — Telephone Encounter (Signed)
I have called the reading room to have it read soon.

## 2023-05-28 ENCOUNTER — Telehealth: Payer: Self-pay | Admitting: Internal Medicine

## 2023-05-28 NOTE — Telephone Encounter (Signed)
Scan has been read and resulted

## 2023-05-28 NOTE — Telephone Encounter (Signed)
noted 

## 2023-05-28 NOTE — Telephone Encounter (Signed)
Pt's daughter, Lupita Leash, brought by the pt's recent bone scan results for Dr. Darrick Huntsman to review before her next appt on 12/17. Ppw is in Dr. Melina Schools folder. Call back # [phone]

## 2023-05-28 NOTE — Telephone Encounter (Signed)
Placed in yellow results folder.  °

## 2023-06-05 ENCOUNTER — Encounter: Payer: Self-pay | Admitting: Internal Medicine

## 2023-06-05 ENCOUNTER — Ambulatory Visit (INDEPENDENT_AMBULATORY_CARE_PROVIDER_SITE_OTHER): Payer: Medicare Other | Admitting: Internal Medicine

## 2023-06-05 VITALS — BP 150/64 | HR 68 | Ht 62.0 in | Wt 129.4 lb

## 2023-06-05 DIAGNOSIS — Z780 Asymptomatic menopausal state: Secondary | ICD-10-CM | POA: Diagnosis not present

## 2023-06-05 DIAGNOSIS — M8000XS Age-related osteoporosis with current pathological fracture, unspecified site, sequela: Secondary | ICD-10-CM

## 2023-06-05 DIAGNOSIS — Z8781 Personal history of (healed) traumatic fracture: Secondary | ICD-10-CM | POA: Diagnosis not present

## 2023-06-05 MED ORDER — LIDOCAINE 5 % EX PTCH: 1.0000 | MEDICATED_PATCH | CUTANEOUS | 0 refills | Status: DC

## 2023-06-05 MED ORDER — TELMISARTAN 80 MG PO TABS: 80.0000 mg | ORAL_TABLET | Freq: Every day | ORAL | 1 refills | Status: DC

## 2023-06-05 NOTE — Patient Instructions (Addendum)
Your back pain is due to compression fracture of several vertebrae   You can take up to 2000 mg of acetominophen (tylenol) every day safely  In divided doses (500 mg every 6 hours  Or 1000 mg every 12 hours.)   You can add the Lidoderm patches as well  I have ordered a bone density test so we can start treatment once the results are known

## 2023-06-05 NOTE — Progress Notes (Unsigned)
Subjective:  Patient ID: [name], female    DOB: 09/29/1922  Age: 89 y.o. MRN: [id number]  CC: There were no encounter diagnoses.   HPI Sherrilyn I Blakenship presents for  Chief Complaint  Patient presents with   Medical Management of Chronic Issues    Discuss bone density scan   Rochanda is a 89 yr old female who was evaluate don Oct 31 in ER for low back pain in the setting  of recent UTI . "CT imaging was performed of the abdomen/pelvis as well as L-spine which shows no acute intra-abdominal pathology. Low suspicion for pyelonephritis without CVA tenderness and no other acute intra-abdominal infections. Most consistent with musculoskeletal injury which patient will be started on Robaxin and Lidoderm patches. "  Her LBP persisted and she was treated by [facility] with tramadol and tizanidine  on nov 3.  MRI was ordered on Nov 8 which confirmed multiple compression fractures.  MRI reports is NOT AVAILABLE    Sent for BM Scan Whole Body which noted multifocal degenerative type uptake   and a severe T12 compression fracture.      Outpatient Medications Prior to Visit  Medication Sig Dispense Refill   amLODipine (NORVASC) 5 MG tablet TAKE ONE (1) TABLET BY MOUTH TWO TIMES PER DAY 180 tablet 2   Calcium Carbonate-Vitamin D 600-200 MG-UNIT TABS Take by mouth.       clopidogrel (PLAVIX) 75 MG tablet TAKE 1 TABLET BY MOUTH DAILY 90 tablet 3   fish oil-omega-3 fatty acids 1000 MG capsule Take by mouth daily.       latanoprost (XALATAN) 0.005 % ophthalmic solution daily.     metoprolol succinate (TOPROL-XL) 25 MG 24 hr tablet TAKE ONE AND ONE-HALF TABLET BY MOUTH EVERY DAY/ due yearly visit. PLEASE CALL OFFICE TO SCHEDULE APPOINTMENT PRIOR TO NEXT REFILL (first attempt) 45 tablet 0   Multiple Vitamin (MULTIVITAMIN) tablet Take 1 tablet by mouth daily.       Multiple Vitamins-Minerals (EYE VITAMINS PO) Take 1 tablet by mouth daily.     pravastatin (PRAVACHOL) 20 MG tablet Take 1 tablet (20 mg  total) by mouth daily. 90 tablet 1   telmisartan (MICARDIS) 80 MG tablet Take 1 tablet (80 mg total) by mouth daily. 90 tablet 1   methocarbamol (ROBAXIN) 500 MG tablet Take 1 tablet (500 mg total) by mouth every 8 (eight) hours as needed for muscle spasms. (Patient not taking: Reported on 06/05/2023) 30 tablet 0   No facility-administered medications prior to visit.    Review of Systems;  Patient denies headache, fevers, malaise, unintentional weight loss, skin rash, eye pain, sinus congestion and sinus pain, sore throat, dysphagia,  hemoptysis , cough, dyspnea, wheezing, chest pain, palpitations, orthopnea, edema, abdominal pain, nausea, melena, diarrhea, constipation, flank pain, dysuria, hematuria, urinary  Frequency, nocturia, numbness, tingling, seizures,  Focal weakness, Loss of consciousness,  Tremor, insomnia, depression, anxiety, and suicidal ideation.      Objective:  BP (!) 150/64   Pulse 68   Ht 5\' 2"  (1.575 m)   Wt 129 lb 6.4 oz (58.7 kg)   SpO2 96%   BMI 23.67 kg/m   BP Readings from Last 3 Encounters:  06/05/23 (!) 150/64  04/19/23 130/66  04/17/23 (!) 172/58    Wt Readings from Last 3 Encounters:  06/05/23 129 lb 6.4 oz (58.7 kg)  04/18/23 133 lb (60.3 kg)  04/17/23 133 lb (60.3 kg)    Physical Exam  Lab Results  Component Value  Date   HGBA1C 5.9 01/02/2023   HGBA1C 5.9 04/19/2022    Lab Results  Component Value Date   CREATININE 0.84 04/19/2023   CREATININE 0.89 04/17/2023   CREATININE 1.04 01/02/2023    Lab Results  Component Value Date   WBC 6.5 04/19/2023   HGB 14.4 04/19/2023   HCT 43.3 04/19/2023   PLT 215 04/19/2023   GLUCOSE 124 (H) 04/19/2023   CHOL 258 (H) 01/02/2023   TRIG 243.0 (H) 01/02/2023   HDL 45.10 01/02/2023   LDLDIRECT 160.0 01/02/2023   LDLCALC 184 (H) 04/19/2022   ALT 12 01/02/2023   AST 19 01/02/2023   NA 137 04/19/2023   K 4.0 04/19/2023   CL 103 04/19/2023   CREATININE 0.84 04/19/2023   BUN 23 04/19/2023    CO2 24 04/19/2023   TSH 4.04 04/19/2022   HGBA1C 5.9 01/02/2023   MICROALBUR 2.1 (H) 04/19/2022    NM Bone Scan Whole Body Result Date: 05/25/2023 CLINICAL DATA:  Low back pain.  Potential metastasis. EXAM: NUCLEAR MEDICINE WHOLE BODY BONE SCAN TECHNIQUE: Whole body anterior and posterior images were obtained approximately 3 hours after intravenous injection of radiopharmaceutical. RADIOPHARMACEUTICALS:  21.68 mCi Technetium-55m MDP IV COMPARISON:  CT scan 04/19/2023 FINDINGS: Physiologic distribution of radiotracer along the kidneys and bladder. There is multifocal areas of degenerative type uptake along the shoulders, spine, knees, ankles and feet as well as the hands. Curvature of the spine also noted. There is significant radiotracer uptake of the thoracolumbar junction, likely T12 corresponding to the severe T12 compression injury. This could be acute to early chronic based on level of uptake. Please correlate for time course of injury. There is also an area of uptake along the posterior elements on the left side at L5. No corresponding lytic or sclerotic lesion on the prior CT scan. Based on location this could be degenerative as well. IMPRESSION: Multifocal degenerative type uptake. Significant uptake along the spine at proximally the T12 level corresponding to the compression injury seen on prior CT scan. Please correlate with time course of injury and recommend follow up to confirm resolution to exclude a marrow replacement process. Electronically Signed   By: Karen Kays M.D.   On: 05/25/2023 12:20    Assessment & Plan:  .There are no diagnoses linked to this encounter.   I provided 30 minutes of face-to-face time during this encounter reviewing patient's last visit with me, patient's  most recent visit with cardiology,  nephrology,  and neurology,  recent surgical and non surgical procedures, previous  labs and imaging studies, counseling on currently addressed issues,  and post visit  ordering to diagnostics and therapeutics .   Follow-up: No follow-ups on file.   Sherlene Shams, MD

## 2023-06-06 DIAGNOSIS — M8000XA Age-related osteoporosis with current pathological fracture, unspecified site, initial encounter for fracture: Secondary | ICD-10-CM | POA: Insufficient documentation

## 2023-06-06 NOTE — Assessment & Plan Note (Signed)
DEXA ordered.  Will start Prolia or Evenity pending results and insurance authorization.  Calcium and vitamin d needs reviewed.

## 2023-06-11 DIAGNOSIS — H16223 Keratoconjunctivitis sicca, not specified as Sjogren's, bilateral: Secondary | ICD-10-CM | POA: Diagnosis not present

## 2023-06-11 DIAGNOSIS — H25013 Cortical age-related cataract, bilateral: Secondary | ICD-10-CM | POA: Diagnosis not present

## 2023-06-11 DIAGNOSIS — H2513 Age-related nuclear cataract, bilateral: Secondary | ICD-10-CM | POA: Diagnosis not present

## 2023-06-11 DIAGNOSIS — H40009 Preglaucoma, unspecified, unspecified eye: Secondary | ICD-10-CM | POA: Diagnosis not present

## 2023-06-21 ENCOUNTER — Encounter: Payer: Self-pay | Admitting: Nurse Practitioner

## 2023-06-21 NOTE — Progress Notes (Signed)
 This encounter was created in error - please disregard.

## 2023-06-28 ENCOUNTER — Other Ambulatory Visit (HOSPITAL_COMMUNITY): Payer: Self-pay

## 2023-06-28 ENCOUNTER — Telehealth: Payer: Self-pay

## 2023-06-28 NOTE — Telephone Encounter (Signed)
 Pharmacy Patient Advocate Encounter   Received notification from CoverMyMeds that prior authorization for Lidocaine  5% patches is required/requested.   Insurance verification completed.   The patient is insured through [employer] .   Per test claim: PA required; PA started via CoverMyMeds. KEY [id number] . Waiting for clinical questions to populate.

## 2023-06-29 NOTE — Telephone Encounter (Signed)
 Pharmacy Patient Advocate Encounter   Received notification from CoverMyMeds that prior authorization for Lidocaine  5% patches is required/requested.   Insurance verification completed.   The patient is insured through [employer] .   Per test claim: PA required; PA submitted to above mentioned insurance via CoverMyMeds Key/confirmation #/EOC [id number] Status is pending

## 2023-07-03 ENCOUNTER — Other Ambulatory Visit (HOSPITAL_COMMUNITY): Payer: Self-pay

## 2023-07-03 NOTE — Telephone Encounter (Signed)
 Pharmacy Patient Advocate Encounter  Received notification from [employer] that Prior Authorization for Lidocaine  5% patches  has been APPROVED from 07/03/23 to 06/18/24. Ran test claim, Copay is $38.63. This test claim was processed through [facility]- copay amounts may vary at other pharmacies due to pharmacy/plan contracts, or as the patient moves through the different stages of their insurance plan.   PA #/Case ID/Reference #:  [id number]  *spoke with [facility] pharmacy

## 2023-07-04 ENCOUNTER — Other Ambulatory Visit (INDEPENDENT_AMBULATORY_CARE_PROVIDER_SITE_OTHER): Payer: Medicare Other

## 2023-07-04 DIAGNOSIS — I10 Essential (primary) hypertension: Secondary | ICD-10-CM

## 2023-07-04 DIAGNOSIS — E782 Mixed hyperlipidemia: Secondary | ICD-10-CM | POA: Diagnosis not present

## 2023-07-04 LAB — COMPREHENSIVE METABOLIC PANEL
ALT: 13 U/L (ref 0–35)
AST: 20 U/L (ref 0–37)
Albumin: 4.1 g/dL (ref 3.5–5.2)
Alkaline Phosphatase: 72 U/L (ref 39–117)
BUN: 23 mg/dL (ref 6–23)
CO2: 30 meq/L (ref 19–32)
Calcium: 10 mg/dL (ref 8.4–10.5)
Chloride: 104 meq/L (ref 96–112)
Creatinine, Ser: 1.03 mg/dL (ref 0.40–1.20)
GFR: 45.2 mL/min — ABNORMAL LOW (ref >60.00)
Glucose, Bld: 99 mg/dL (ref 70–99)
Potassium: 4.6 meq/L (ref 3.5–5.1)
Sodium: 140 meq/L (ref 135–145)
Total Bilirubin: 0.7 mg/dL (ref 0.2–1.2)
Total Protein: 6.9 g/dL (ref 6.0–8.3)

## 2023-07-04 LAB — LIPID PANEL
Cholesterol: 263 mg/dL — ABNORMAL HIGH (ref 0–200)
HDL: 53 mg/dL (ref >39.00)
LDL Cholesterol: 174 mg/dL — ABNORMAL HIGH (ref 0–99)
NonHDL: 210.2
Total CHOL/HDL Ratio: 5
Triglycerides: 181 mg/dL — ABNORMAL HIGH (ref 0.0–149.0)
VLDL: 36.2 mg/dL (ref 0.0–40.0)

## 2023-07-04 LAB — LDL CHOLESTEROL, DIRECT: Direct LDL: 160 mg/dL

## 2023-07-04 LAB — MICROALBUMIN / CREATININE URINE RATIO
Creatinine,U: 98.2 mg/dL
Microalb Creat Ratio: 0.9 mg/g (ref 0.0–30.0)
Microalb, Ur: 0.8 mg/dL (ref 0.0–1.9)

## 2023-07-09 ENCOUNTER — Ambulatory Visit: Payer: Medicare Other | Admitting: Internal Medicine

## 2023-07-17 ENCOUNTER — Other Ambulatory Visit: Payer: Self-pay | Admitting: Internal Medicine

## 2023-07-26 ENCOUNTER — Encounter: Payer: Self-pay | Admitting: Internal Medicine

## 2023-07-26 ENCOUNTER — Ambulatory Visit: Payer: Medicare Other | Admitting: Internal Medicine

## 2023-07-26 VITALS — BP 130/70 | HR 58 | Ht 62.0 in | Wt 129.8 lb

## 2023-07-26 DIAGNOSIS — E559 Vitamin D deficiency, unspecified: Secondary | ICD-10-CM

## 2023-07-26 DIAGNOSIS — N183 Chronic kidney disease, stage 3 unspecified: Secondary | ICD-10-CM

## 2023-07-26 DIAGNOSIS — I428 Other cardiomyopathies: Secondary | ICD-10-CM | POA: Diagnosis not present

## 2023-07-26 DIAGNOSIS — Z7902 Long term (current) use of antithrombotics/antiplatelets: Secondary | ICD-10-CM

## 2023-07-26 DIAGNOSIS — I10 Essential (primary) hypertension: Secondary | ICD-10-CM | POA: Diagnosis not present

## 2023-07-26 DIAGNOSIS — R7303 Prediabetes: Secondary | ICD-10-CM

## 2023-07-26 DIAGNOSIS — M8000XA Age-related osteoporosis with current pathological fracture, unspecified site, initial encounter for fracture: Secondary | ICD-10-CM

## 2023-07-26 DIAGNOSIS — I5022 Chronic systolic (congestive) heart failure: Secondary | ICD-10-CM

## 2023-07-26 DIAGNOSIS — E7800 Pure hypercholesterolemia, unspecified: Secondary | ICD-10-CM | POA: Diagnosis not present

## 2023-07-26 DIAGNOSIS — N898 Other specified noninflammatory disorders of vagina: Secondary | ICD-10-CM

## 2023-07-26 LAB — CBC WITH DIFFERENTIAL/PLATELET
Basophils Absolute: 0 10*3/uL (ref 0.0–0.1)
Basophils Relative: 0.7 % (ref 0.0–3.0)
Eosinophils Absolute: 0.2 10*3/uL (ref 0.0–0.7)
Eosinophils Relative: 3.8 % (ref 0.0–5.0)
HCT: 44 % (ref 36.0–46.0)
Hemoglobin: 14.5 g/dL (ref 12.0–15.0)
Lymphocytes Relative: 27 % (ref 12.0–46.0)
Lymphs Abs: 1.6 10*3/uL (ref 0.7–4.0)
MCHC: 32.9 g/dL (ref 30.0–36.0)
MCV: 95.6 fL (ref 78.0–100.0)
Monocytes Absolute: 0.7 10*3/uL (ref 0.1–1.0)
Monocytes Relative: 11.5 % (ref 3.0–12.0)
Neutro Abs: 3.3 10*3/uL (ref 1.4–7.7)
Neutrophils Relative %: 57 % (ref 43.0–77.0)
Platelets: 199 10*3/uL (ref 150.0–400.0)
RBC: 4.6 Mil/uL (ref 3.87–5.11)
RDW: 14.8 % (ref 11.5–15.5)
WBC: 5.8 10*3/uL (ref 4.0–10.5)

## 2023-07-26 LAB — VITAMIN D 25 HYDROXY (VIT D DEFICIENCY, FRACTURES): VITD: 57.16 ng/mL (ref 30.00–100.00)

## 2023-07-26 LAB — TSH: TSH: 2.97 u[IU]/mL (ref 0.35–5.50)

## 2023-07-26 LAB — HEMOGLOBIN A1C: Hgb A1c MFr Bld: 5.7 % (ref 4.6–6.5)

## 2023-07-26 MED ORDER — LIDOCAINE 5 % EX PTCH: 1.0000 | MEDICATED_PATCH | CUTANEOUS | 2 refills | Status: AC

## 2023-07-26 MED ORDER — FLUCONAZOLE 150 MG PO TABS
150.0000 mg | ORAL_TABLET | Freq: Every day | ORAL | 0 refills | Status: DC
Start: 2023-07-26 — End: 2023-08-24

## 2023-07-26 MED ORDER — CLOPIDOGREL BISULFATE 75 MG PO TABS: 75.0000 mg | ORAL_TABLET | Freq: Every day | ORAL | 3 refills | Status: DC

## 2023-07-26 NOTE — Progress Notes (Signed)
 Subjective:  Patient ID: [name], female    DOB: 1923-05-21  Age: 89 y.o. MRN: [id number]  CC: The primary encounter diagnosis was Prediabetes. Diagnoses of Stage 3 chronic kidney disease, unspecified whether stage 3a or 3b CKD (HCC), Age-related osteoporosis with current pathological fracture, initial encounter, Vitamin D  deficiency, Encounter for current long term use of antiplatelet drug, Pure hypercholesterolemia, Chronic systolic heart failure (HCC), Nonischemic cardiomyopathy (HCC), Primary hypertension, and Itching in the vaginal area were also pertinent to this visit.   HPI [name] presents for  Chief Complaint  Patient presents with   Medical Management of Chronic Issues    Yearly follow up    1) osteoporosis with recent T12 fracture, multiple chronic fractures noted on recent MRI spine.  DEXA ordered and scheduled for march 5 with patient/daughter [name]    2) intermittent vaginal itch managed with OTC vaginal suppositories . No discharge and no blood .  Uses Dove antibacterial soap in the shower   3) Hypertension: patient checks blood pressure twice weekly at home.  Readings have been for the most part <140/80 at rest . Patient is following a reduced salt diet most days and is taking medications as prescribed   Outpatient Medications Prior to Visit  Medication Sig Dispense Refill   amLODipine  (NORVASC ) 5 MG tablet TAKE ONE (1) TABLET BY MOUTH TWO TIMES PER DAY 180 tablet 2   Calcium Carbonate-Vitamin D  600-200 MG-UNIT TABS Take by mouth.       fish oil-omega-3 fatty acids 1000 MG capsule Take by mouth daily.       latanoprost (XALATAN) 0.005 % ophthalmic solution daily.     metoprolol  succinate (TOPROL -XL) 25 MG 24 hr tablet TAKE ONE AND ONE-HALF TABLET BY MOUTH EVERY DAY/ due yearly visit. PLEASE CALL OFFICE TO SCHEDULE APPOINTMENT PRIOR TO NEXT REFILL (first attempt) 45 tablet 0   Multiple Vitamin (MULTIVITAMIN) tablet Take 1 tablet by mouth daily.        Multiple Vitamins-Minerals (EYE VITAMINS PO) Take 1 tablet by mouth daily.     pravastatin  (PRAVACHOL ) 20 MG tablet TAKE ONE TABLET (20 MG) BY MOUTH EVERY EVENING 90 tablet 1   telmisartan  (MICARDIS ) 80 MG tablet Take 1 tablet (80 mg total) by mouth daily. 90 tablet 1   clopidogrel  (PLAVIX ) 75 MG tablet TAKE 1 TABLET BY MOUTH DAILY 90 tablet 3   lidocaine  (LIDODERM ) 5 % Place 1 patch onto the skin daily. Remove & Discard patch within 12 hours or as directed by MD 30 patch 0   No facility-administered medications prior to visit.    Review of Systems;  Patient denies headache, fevers, malaise, unintentional weight loss, skin rash, eye pain, sinus congestion and sinus pain, sore throat, dysphagia,  hemoptysis , cough, dyspnea, wheezing, chest pain, palpitations, orthopnea, edema, abdominal pain, nausea, melena, diarrhea, constipation, flank pain, dysuria, hematuria, urinary  Frequency, nocturia, numbness, tingling, seizures,  Focal weakness, Loss of consciousness,  Tremor, insomnia, depression, anxiety, and suicidal ideation.      Objective:  BP 130/70   Pulse (!) 58   Ht 5' 2 (1.575 m)   Wt 129 lb 12.8 oz (58.9 kg)   SpO2 92%   BMI 23.74 kg/m   BP Readings from Last 3 Encounters:  07/26/23 130/70  06/05/23 (!) 150/64  04/19/23 130/66    Wt Readings from Last 3 Encounters:  07/26/23 129 lb 12.8 oz (58.9 kg)  06/05/23 129 lb 6.4 oz (58.7 kg)  04/18/23 133 lb (60.3 kg)  Physical Exam Vitals reviewed.  Constitutional:      General: She is not in acute distress.    Appearance: Normal appearance. She is normal weight. She is not ill-appearing, toxic-appearing or diaphoretic.  HENT:     Head: Normocephalic.  Eyes:     General: No scleral icterus.       Right eye: No discharge.        Left eye: No discharge.     Conjunctiva/sclera: Conjunctivae normal.  Cardiovascular:     Rate and Rhythm: Normal rate and regular rhythm.     Heart sounds: Normal heart sounds.  Pulmonary:      Effort: Pulmonary effort is normal. No respiratory distress.     Breath sounds: Normal breath sounds.  Musculoskeletal:        General: Normal range of motion.  Skin:    General: Skin is warm and dry.  Neurological:     General: No focal deficit present.     Mental Status: She is alert and oriented to person, place, and time. Mental status is at baseline.  Psychiatric:        Mood and Affect: Mood normal.        Behavior: Behavior normal.        Thought Content: Thought content normal.        Judgment: Judgment normal.    Lab Results  Component Value Date   HGBA1C 5.7 07/26/2023   HGBA1C 5.9 01/02/2023   HGBA1C 5.9 04/19/2022    Lab Results  Component Value Date   CREATININE 1.03 07/04/2023   CREATININE 0.84 04/19/2023   CREATININE 0.89 04/17/2023    Lab Results  Component Value Date   WBC 5.8 07/26/2023   HGB 14.5 07/26/2023   HCT 44.0 07/26/2023   PLT 199.0 07/26/2023   GLUCOSE 99 07/04/2023   CHOL 263 (H) 07/04/2023   TRIG 181.0 (H) 07/04/2023   HDL 53.00 07/04/2023   LDLDIRECT 160.0 07/04/2023   LDLCALC 174 (H) 07/04/2023   ALT 13 07/04/2023   AST 20 07/04/2023   NA 140 07/04/2023   K 4.6 07/04/2023   CL 104 07/04/2023   CREATININE 1.03 07/04/2023   BUN 23 07/04/2023   CO2 30 07/04/2023   TSH 2.97 07/26/2023   HGBA1C 5.7 07/26/2023   MICROALBUR 0.8 07/04/2023    NM Bone Scan Whole Body Result Date: 05/25/2023 CLINICAL DATA:  Low back pain.  Potential metastasis. EXAM: NUCLEAR MEDICINE WHOLE BODY BONE SCAN TECHNIQUE: Whole body anterior and posterior images were obtained approximately 3 hours after intravenous injection of radiopharmaceutical. RADIOPHARMACEUTICALS:  21.68 mCi Technetium-47m MDP IV COMPARISON:  CT scan 04/19/2023 FINDINGS: Physiologic distribution of radiotracer along the kidneys and bladder. There is multifocal areas of degenerative type uptake along the shoulders, spine, knees, ankles and feet as well as the hands. Curvature of the  spine also noted. There is significant radiotracer uptake of the thoracolumbar junction, likely T12 corresponding to the severe T12 compression injury. This could be acute to early chronic based on level of uptake. Please correlate for time course of injury. There is also an area of uptake along the posterior elements on the left side at L5. No corresponding lytic or sclerotic lesion on the prior CT scan. Based on location this could be degenerative as well. IMPRESSION: Multifocal degenerative type uptake. Significant uptake along the spine at proximally the T12 level corresponding to the compression injury seen on prior CT scan. Please correlate with time course of injury and recommend follow up  to confirm resolution to exclude a marrow replacement process. Electronically Signed   By: Ranell Bring M.D.   On: 05/25/2023 12:20    Assessment & Plan:  .Prediabetes -     Hemoglobin A1c  Stage 3 chronic kidney disease, unspecified whether stage 3a or 3b CKD (HCC)  Age-related osteoporosis with current pathological fracture, initial encounter Assessment & Plan: T12 fracture recently diagnosed,  but films noted prior verbral fractures at multiple levels. DEXA ordered. And scheduled for early march.   Will start Prolia or Evenity  pending results and insurance authorization.  Calcium and vitamin d  needs reviewed.   Lidocaine  patches prescribed    Vitamin D  deficiency -     VITAMIN D  25 Hydroxy (Vit-D Deficiency, Fractures)  Encounter for current long term use of antiplatelet drug -     CBC with Differential/Platelet  Pure hypercholesterolemia -     TSH  Chronic systolic heart failure (HCC) Assessment & Plan: She follows up semi annually and  remains asymptomatic today.  She Is Walking daily for exercise and has no  edema.    Nonischemic cardiomyopathy [facility]) Assessment & Plan: She remains asymptomatic   Exercising 3/week.  No edema.    Primary hypertension Assessment & Plan: Reviewed home  readings on current 3 drug therapy .  Readigs for the last week have been < 140/80.  No changes today    Itching in the vaginal area Assessment & Plan: Without discharge.  Trial of fluconazole  150 mg daily x 2    Other orders -     Clopidogrel  Bisulfate; Take 1 tablet (75 mg total) by mouth daily.  Dispense: 90 tablet; Refill: 3 -     Lidocaine ; Place 1 patch onto the skin daily. Remove & Discard patch within 12 hours or as directed by MD  Dispense: 30 patch; Refill: 2 -     Fluconazole ; Take 1 tablet (150 mg total) by mouth daily. For yeast infection/vaginitis  Dispense: 2 tablet; Refill: 0    Follow-up: Return in about 2 months (around 09/23/2023).   Verneita LITTIE Kettering, MD

## 2023-07-26 NOTE — Patient Instructions (Addendum)
 Please monitor  your Blood pressure weekly and notify me If your readings are  < 110/70  or > 140/90   I have sent the lidocaine  patch and the fluconazole  pill (to take once daily for 2 days for vaginal itching ) to [facility]

## 2023-07-28 DIAGNOSIS — N898 Other specified noninflammatory disorders of vagina: Secondary | ICD-10-CM | POA: Insufficient documentation

## 2023-07-28 NOTE — Assessment & Plan Note (Signed)
 Reviewed home readings on current 3 drug therapy .  Readigs for the last week have been < 140/80.  No changes today

## 2023-07-28 NOTE — Assessment & Plan Note (Signed)
 She remains asymptomatic   Exercising 3/week.  No edema.

## 2023-07-28 NOTE — Assessment & Plan Note (Addendum)
 Without discharge.  Trial of fluconazole  150 mg daily x 2

## 2023-07-28 NOTE — Assessment & Plan Note (Signed)
 She follows up semi annually and  remains asymptomatic today.  She Is Walking daily for exercise and has no  edema.

## 2023-07-28 NOTE — Assessment & Plan Note (Addendum)
 T12 fracture recently diagnosed,  but films noted prior verbral fractures at multiple levels. DEXA ordered. And scheduled for early march.   Will start Prolia or Evenity  pending results and insurance authorization.  Calcium and vitamin d  needs reviewed.   Lidocaine  patches prescribed

## 2023-08-17 DIAGNOSIS — Z03818 Encounter for observation for suspected exposure to other biological agents ruled out: Secondary | ICD-10-CM | POA: Diagnosis not present

## 2023-08-17 DIAGNOSIS — J019 Acute sinusitis, unspecified: Secondary | ICD-10-CM | POA: Diagnosis not present

## 2023-08-17 DIAGNOSIS — J209 Acute bronchitis, unspecified: Secondary | ICD-10-CM | POA: Diagnosis not present

## 2023-08-17 DIAGNOSIS — U071 COVID-19: Secondary | ICD-10-CM | POA: Diagnosis not present

## 2023-08-17 DIAGNOSIS — B9689 Other specified bacterial agents as the cause of diseases classified elsewhere: Secondary | ICD-10-CM | POA: Diagnosis not present

## 2023-08-22 ENCOUNTER — Other Ambulatory Visit: Payer: Medicare Other

## 2023-08-23 ENCOUNTER — Emergency Department

## 2023-08-23 ENCOUNTER — Other Ambulatory Visit: Payer: Self-pay

## 2023-08-23 ENCOUNTER — Observation Stay
Admission: EM | Admit: 2023-08-23 | Discharge: 2023-08-24 | Disposition: A | Discharge status: Home / Self Care | Attending: Internal Medicine | Admitting: Internal Medicine

## 2023-08-23 ENCOUNTER — Encounter: Payer: Self-pay | Admitting: Emergency Medicine

## 2023-08-23 DIAGNOSIS — R9431 Abnormal electrocardiogram [ECG] [EKG]: Secondary | ICD-10-CM | POA: Diagnosis not present

## 2023-08-23 DIAGNOSIS — I428 Other cardiomyopathies: Secondary | ICD-10-CM | POA: Diagnosis not present

## 2023-08-23 DIAGNOSIS — R0602 Shortness of breath: Secondary | ICD-10-CM | POA: Diagnosis not present

## 2023-08-23 DIAGNOSIS — U071 COVID-19: Secondary | ICD-10-CM | POA: Diagnosis not present

## 2023-08-23 DIAGNOSIS — I672 Cerebral atherosclerosis: Secondary | ICD-10-CM | POA: Diagnosis present

## 2023-08-23 DIAGNOSIS — Z1152 Encounter for screening for COVID-19: Secondary | ICD-10-CM | POA: Diagnosis not present

## 2023-08-23 DIAGNOSIS — M40204 Unspecified kyphosis, thoracic region: Secondary | ICD-10-CM | POA: Diagnosis not present

## 2023-08-23 DIAGNOSIS — J984 Other disorders of lung: Secondary | ICD-10-CM | POA: Diagnosis not present

## 2023-08-23 DIAGNOSIS — I447 Left bundle-branch block, unspecified: Secondary | ICD-10-CM | POA: Diagnosis not present

## 2023-08-23 DIAGNOSIS — I10 Essential (primary) hypertension: Secondary | ICD-10-CM | POA: Diagnosis present

## 2023-08-23 DIAGNOSIS — Z7902 Long term (current) use of antithrombotics/antiplatelets: Secondary | ICD-10-CM | POA: Insufficient documentation

## 2023-08-23 DIAGNOSIS — Z789 Other specified health status: Secondary | ICD-10-CM | POA: Diagnosis present

## 2023-08-23 DIAGNOSIS — N179 Acute kidney failure, unspecified: Secondary | ICD-10-CM | POA: Insufficient documentation

## 2023-08-23 DIAGNOSIS — Z8673 Personal history of transient ischemic attack (TIA), and cerebral infarction without residual deficits: Secondary | ICD-10-CM | POA: Diagnosis not present

## 2023-08-23 DIAGNOSIS — I700 Atherosclerosis of aorta: Secondary | ICD-10-CM | POA: Diagnosis not present

## 2023-08-23 DIAGNOSIS — E782 Mixed hyperlipidemia: Secondary | ICD-10-CM | POA: Diagnosis not present

## 2023-08-23 DIAGNOSIS — I2510 Atherosclerotic heart disease of native coronary artery without angina pectoris: Secondary | ICD-10-CM | POA: Diagnosis not present

## 2023-08-23 DIAGNOSIS — Z79899 Other long term (current) drug therapy: Secondary | ICD-10-CM | POA: Diagnosis not present

## 2023-08-23 DIAGNOSIS — R079 Chest pain, unspecified: Secondary | ICD-10-CM | POA: Diagnosis present

## 2023-08-23 DIAGNOSIS — G459 Transient cerebral ischemic attack, unspecified: Secondary | ICD-10-CM | POA: Diagnosis present

## 2023-08-23 LAB — CBC
HCT: 46.9 % — ABNORMAL HIGH (ref 36.0–46.0)
Hemoglobin: 15.5 g/dL — ABNORMAL HIGH (ref 12.0–15.0)
MCH: 30.9 pg (ref 26.0–34.0)
MCHC: 33 g/dL (ref 30.0–36.0)
MCV: 93.4 fL (ref 80.0–100.0)
Platelets: 159 10*3/uL (ref 150–400)
RBC: 5.02 MIL/uL (ref 3.87–5.11)
RDW: 13.8 % (ref 11.5–15.5)
WBC: 5.8 10*3/uL (ref 4.0–10.5)
nRBC: 0 % (ref 0.0–0.2)

## 2023-08-23 LAB — BASIC METABOLIC PANEL WITH GFR
Anion gap: 10 (ref 5–15)
BUN: 24 mg/dL — ABNORMAL HIGH (ref 8–23)
CO2: 26 mmol/L (ref 22–32)
Calcium: 9.4 mg/dL (ref 8.9–10.3)
Chloride: 100 mmol/L (ref 98–111)
Creatinine, Ser: 1.12 mg/dL — ABNORMAL HIGH (ref 0.44–1.00)
GFR, Estimated: 44 mL/min — ABNORMAL LOW
Glucose, Bld: 119 mg/dL — ABNORMAL HIGH (ref 70–99)
Potassium: 4.2 mmol/L (ref 3.5–5.1)
Sodium: 136 mmol/L (ref 135–145)

## 2023-08-23 LAB — TROPONIN I (HIGH SENSITIVITY)
Troponin I (High Sensitivity): 16 ng/L (ref <18)
Troponin I (High Sensitivity): 15 ng/L (ref <18)

## 2023-08-23 LAB — SARS CORONAVIRUS 2 BY RT PCR: SARS Coronavirus 2 by RT PCR: POSITIVE — AB

## 2023-08-23 MED ORDER — HEPARIN SODIUM (PORCINE) 5000 UNIT/ML IJ SOLN
5000.0000 [IU] | Freq: Three times a day (TID) | INTRAMUSCULAR | Status: DC
  Administered 2023-08-23 – 2023-08-24 (×2): 5000 [IU] via SUBCUTANEOUS
  Filled 2023-08-23 (×2): qty 1

## 2023-08-23 MED ORDER — HYDRALAZINE HCL 20 MG/ML IJ SOLN: 5.0000 mg | Freq: Four times a day (QID) | INTRAMUSCULAR | Status: DC | PRN

## 2023-08-23 MED ORDER — ACETAMINOPHEN 325 MG PO TABS: 650.0000 mg | ORAL_TABLET | Freq: Four times a day (QID) | ORAL | Status: DC | PRN

## 2023-08-23 MED ORDER — METOPROLOL SUCCINATE ER 25 MG PO TB24
37.5000 mg | ORAL_TABLET | Freq: Every day | ORAL | Status: DC
  Administered 2023-08-24: 37.5 mg via ORAL
  Filled 2023-08-23: qty 2

## 2023-08-23 MED ORDER — BENZONATATE 100 MG PO CAPS
200.0000 mg | ORAL_CAPSULE | Freq: Two times a day (BID) | ORAL | Status: DC | PRN
  Administered 2023-08-23: 200 mg via ORAL
  Filled 2023-08-23: qty 2

## 2023-08-23 MED ORDER — ONDANSETRON HCL 4 MG/2ML IJ SOLN: 4.0000 mg | Freq: Four times a day (QID) | INTRAMUSCULAR | Status: DC | PRN

## 2023-08-23 MED ORDER — ONDANSETRON HCL 4 MG PO TABS: 4.0000 mg | ORAL_TABLET | Freq: Four times a day (QID) | ORAL | Status: DC | PRN

## 2023-08-23 MED ORDER — AMLODIPINE BESYLATE 5 MG PO TABS
5.0000 mg | ORAL_TABLET | Freq: Every day | ORAL | Status: DC
  Administered 2023-08-24: 5 mg via ORAL
  Filled 2023-08-23: qty 1

## 2023-08-23 MED ORDER — CLOPIDOGREL BISULFATE 75 MG PO TABS
75.0000 mg | ORAL_TABLET | Freq: Every day | ORAL | Status: DC
  Administered 2023-08-24: 75 mg via ORAL
  Filled 2023-08-23: qty 1

## 2023-08-23 MED ORDER — ACETAMINOPHEN 650 MG RE SUPP: 650.0000 mg | Freq: Four times a day (QID) | RECTAL | Status: DC | PRN

## 2023-08-23 MED ORDER — SENNOSIDES-DOCUSATE SODIUM 8.6-50 MG PO TABS: 1.0000 | ORAL_TABLET | Freq: Every evening | ORAL | Status: DC | PRN

## 2023-08-23 MED ORDER — PRAVASTATIN SODIUM 20 MG PO TABS
20.0000 mg | ORAL_TABLET | Freq: Every day | ORAL | Status: DC
  Administered 2023-08-23: 20 mg via ORAL
  Filled 2023-08-23: qty 1

## 2023-08-23 MED ORDER — IRBESARTAN 150 MG PO TABS: 300.0000 mg | ORAL_TABLET | Freq: Every day | ORAL | Status: DC

## 2023-08-23 NOTE — ED Provider Notes (Signed)
 [facility] Provider Note    Event Date/Time   First MD Initiated Contact with Patient 08/23/23 1417     (approximate)   History   Shortness of Breath   HPI  [name] is a 89 y.o. female who presents to the ED for evaluation of Shortness of Breath   Review of cardiology clinic visit from 2023.  Nonischemic cardiomyopathy.  2014 left heart cath with mild nonobstructive CAD, normal EF.  Patient plans for evaluation of generalized malaise and dyspnea on exertion.  She was diagnosed with COVID-19 this past Friday, 7 days ago.  Resides at a local independent living facility.  Presents with her daughter due to shortness of breath on exertion.  Daughter reports concern the patient seems short of breath over the phone and she was talking to her yesterday and today, which is why daughter went to check on the patient today who brought her to the ED.   Physical Exam   Triage Vital Signs: ED Triage Vitals  Encounter Vitals Group     BP 08/23/23 1358 (!) 116/59     Systolic BP Percentile --      Diastolic BP Percentile --      Pulse Rate 08/23/23 1358 71     Resp 08/23/23 1358 20     Temp --      Temp src --      SpO2 08/23/23 1358 91 %     Weight 08/23/23 1349 123 lb (55.8 kg)     Height 08/23/23 1349 5' (1.524 m)     Head Circumference --      Peak Flow --      Pain Score 08/23/23 1349 0     Pain Loc --      Pain Education --      Exclude from Growth Chart --     Most recent vital signs: Vitals:   08/23/23 1358  BP: (!) 116/59  Pulse: 71  Resp: 20  SpO2: 91%    General: Awake, no distress. Sitting upright in a wheelchair.  CV:  Good peripheral perfusion.  Resp:  Normal effort.  Abd:  No distention.  MSK:  No deformity noted.  Neuro:  No focal deficits appreciated. Other:     ED Results / Procedures / Treatments   Labs (all labs ordered are listed, but only abnormal results are displayed) Labs Reviewed  CBC - Abnormal; Notable  for the following components:      Result Value   Hemoglobin 15.5 (*)    HCT 46.9 (*)    All other components within normal limits  BASIC METABOLIC PANEL  TROPONIN I (HIGH SENSITIVITY)    EKG Sinus rhythm with a rate of 71 bpm leftward axis, left bundle, concerning for ischemic changes considering Sgarbossa criteria and degree of discordant ST segment changes.  Comparison EKG from November 2023 with a left bundle branch block, sinus rhythm with a rate of 67 bpm.  RADIOLOGY   Official radiology report(s): No results found.  PROCEDURES and INTERVENTIONS:  .1-3 Lead EKG Interpretation  Performed by: Delton Prairie, MD Authorized by: Delton Prairie, MD     Interpretation: normal     ECG rate:  68   ECG rate assessment: normal     Rhythm: sinus rhythm     Ectopy: none     Conduction: normal   .Critical Care  Performed by: Delton Prairie, MD Authorized by: Delton Prairie, MD   Critical care provider statement:  Critical care time (minutes):  30   Critical care time was exclusive of:  Separately billable procedures and treating other patients   Critical care was necessary to treat or prevent imminent or life-threatening deterioration of the following conditions:  Respiratory failure   Critical care was time spent personally by me on the following activities:  Development of treatment plan with patient or surrogate, discussions with consultants, evaluation of patient's response to treatment, examination of patient, ordering and review of laboratory studies, ordering and review of radiographic studies, ordering and performing treatments and interventions, pulse oximetry, re-evaluation of patient's condition and review of old charts   Medications - No data to display   IMPRESSION / MDM / ASSESSMENT AND PLAN / ED COURSE  I reviewed the triage vital signs and the nursing notes.  Differential diagnosis includes, but is not limited to, ACS, PTX, PNA, muscle strain/spasm, PE, dissection,  anxiety, pleural effusion  {Patient presents with symptoms of an acute illness or injury that is potentially life-threatening.  Patient presents with dyspnea on exertion and recently being diagnosed with COVID-19 with some borderline hypoxia requiring medical admission.  She has an abnormal EKG with a left bundle branch block at baseline and concerning superimposed ischemic features but, as below, conversation with STEMI cardiologist we hold off on calling a STEMI due to her lack of symptoms at rest and little clinical suspicion for STEMI syndrome.  Her saturations are borderline on room air 90-92% and she is placed on 2 L due to hypoxia.  No leukocytosis and she has a negative first troponin.  We will trend this and consult medicine for observation  Clinical Course as of 08/23/23 1524  Thu Aug 23, 2023  1421 Went to assess the patient but she is an x-ray for a chest image [DS]  1427 Page cardiology after clarifying symptoms in the room [DS]  1431 I consult with Dr. Herbie Baltimore.  Due to active/ongoing symptoms as well as her age, no indication for immediate left heart cath [DS]    Clinical Course User Index [DS] Delton Prairie, MD     FINAL CLINICAL IMPRESSION(S) / ED DIAGNOSES   Final diagnoses:  None     Rx / DC Orders   ED Discharge Orders     None        Note:  This document was prepared using Dragon voice recognition software and may include unintentional dictation errors.   Delton Prairie, MD 08/23/23 [phone]

## 2023-08-23 NOTE — H&P (Signed)
 History and Physical   RISE TRAEGER WUJ:[id number] DOB: June 29, 1922 DOA: 08/23/2023  PCP: Sherlene Shams, MD  Patient coming from: Home via POV  I have personally briefly reviewed patient's old medical records in [facility] EMR.  Chief Concern: shortness of breath, weakness  HPI: [name] is a 89 year old female with history of CAD, hypertension, hyperlipidemia, hyperglycemia intolerance, who presents emergency department for chief concerns of shortness of breath, weakness for the past few days.  Vitals in the ED showed temperature 98.5, respiration rate 20, heart rate 71, blood pressure 116/59, SpO2 of 91% on room air.  Serum sodium is 136, potassium 4.2, chloride 100, bicarb 26, BUN of 24, serum creatinine of 1.12, EGFR 44, nonfasting blood glucose 119, WBC 5.8, hemoglobin 15.5, platelets of 159.  ED treatment: None.  EDP discussed with STEMI cardiologist, Dr. Herbie Baltimore, who per ED documentation agreed that EKG is concerning for ischemia and advise based on patient's age, without elevated troponin, STEMI team activation not recommended at this time. ---------------------------------- At bedside, patient able to tell me her first and last name, age, location, current calendar year.  She reports she has been short of breath for the past week and has been worsening over the last 2 days prompting her to come to the ED.  She was diagnosed with COVID-19 on 08/10/2023.  Patient has completed her course of steroids and azithromycin outpatient.  Daughter was talking to patient over the phone yesterday and noted that she was short of breath.  Daughter was concerned and checked on the patient today and because she had work of breathing, this prompted daughter to bring the patient to the hospital.  She denies nausea, vomiting, dysuria, hematuria, diarrhea, fever, chills.  She denies being short of breath or having chest pain at this time.  Social history: Independent living facility.  She  denies history of tobacco, EtOH, recreational drug use.  She is retired and previously was a Tree surgeon.  ROS: Constitutional: no weight change, no fever ENT/Mouth: no sore throat, no rhinorrhea Eyes: no eye pain, no vision changes Cardiovascular: no chest pain, + dyspnea,  no edema, no palpitations Respiratory: no cough, no sputum, no wheezing Gastrointestinal: no nausea, no vomiting, no diarrhea, no constipation Genitourinary: no urinary incontinence, no dysuria, no hematuria Musculoskeletal: no arthralgias, no myalgias Skin: no skin lesions, no pruritus, Neuro: + weakness, no loss of consciousness, no syncope Psych: no anxiety, no depression, + decrease appetite Heme/Lymph: no bruising, no bleeding  ED Course: Discussed with EDP, patient requiring hospitalization for chief concerns of chest pain, EKG concerning for ischemic changes.  Assessment/Plan  Principal Problem:   Shortness of breath Active Problems:   Mixed hyperlipidemia   Essential hypertension   Nonischemic cardiomyopathy (HCC)   TIA (transient ischemic attack)   Arteriosclerotic cerebrovascular disease   COVID-19 virus infection   AKI (acute kidney injury) (HCC)   Assessment and Plan:  * Shortness of breath With weakness for 1 week, progressively worsened Patient reports the symptoms have resolved since being in the hospital EKG was concerning for ischemic changes.  STEMI cardiologist was called and does not recommend STEMI activation at this time High since troponin was initially 16 and pending a second level [facility] cardiology specialist has been consulted, Dr. Cristal Deer is aware Admit to PCU, inpatient  AKI (acute kidney injury) (HCC) Baseline no prior CKD Baseline serum creatinine is 0.84-1.13, eGFR > 60 Suspect cardiorenal, complete echo ordered Home ARB not resumed on admission in setting of AKI, a.m.  team to resume when the benefits outweigh the risk Strict I's and O's Recheck BMP in  a.m.  COVID-19 virus infection Continue airborne and contact precautions  TIA (transient ischemic attack) Home Plavix 75 mg daily, pravastatin 20 mg every evening resumed on admission  Essential hypertension Home amlodipine 5 mg daily, metoprolol succinate 37.5 mg daily were resumed this admission Home ARB not resumed on admission in setting of AKI, a.m. team to resume when the benefits outweigh the risk Hydralazine 5 mg IV every 6 hours as needed for SBP greater than 165, 5 days ordered  Mixed hyperlipidemia Home pravastatin 20 mg every evening resumed on admission  Chart reviewed.   DVT prophylaxis: Heparin 5000 units subcutaneous every 8 hours Code Status: DNR/DNI Diet: Heart healthy Family Communication: Updated daughter at bedside with patient's permission Disposition Plan: Pending clinical course Consults called: Cardiology service Admission status: PCU, inpatient  Past Medical History:  Diagnosis Date   Carotid artery disease (HCC)    Coronary artery disease, non-occlusive    Hyperlipidemia    Hypertension    Left bundle branch block    Nonischemic cardiomyopathy (HCC)    Past Surgical History:  Procedure Laterality Date   ABDOMINAL HYSTERECTOMY     ankle     rods in both ankles   BREAST BIOPSY Bilateral YRS AGO   NEG   CARDIAC CATHETERIZATION     [facility]   left ankle  Octo 2012   s/p pinning , Krazinski   LEFT HEART CATHETERIZATION WITH CORONARY ANGIOGRAM N/A 10/31/2012   Procedure: LEFT HEART CATHETERIZATION WITH CORONARY ANGIOGRAM;  Surgeon: Robynn Pane, MD;  Location: [facility] CATH LAB;  Service: Cardiovascular;  Laterality: N/A;   Social History:  reports that she has never smoked. She has never used smokeless tobacco. She reports that she does not drink alcohol and does not use drugs.  Allergies  Allergen Reactions   Contrast Media [Iodinated Contrast Media] Rash and Other (See Comments)    Swelling of the body.   Sulfa Antibiotics Hives, Swelling and Rash    Family History  Problem Relation Age of Onset   Hypertension Mother    Heart attack Mother    Hypertension Father    Cancer Father        stomach   Heart attack Brother    Stroke Sister    Breast cancer Neg Hx    Family history: Family history reviewed and not pertinent.  Prior to Admission medications   Medication Sig Start Date End Date Taking? Authorizing Provider  amLODipine (NORVASC) 5 MG tablet TAKE ONE (1) TABLET BY MOUTH TWO TIMES PER DAY 12/06/22   Sherlene Shams, MD  Calcium Carbonate-Vitamin D 600-200 MG-UNIT TABS Take by mouth.      [provider]  clopidogrel (PLAVIX) 75 MG tablet Take 1 tablet (75 mg total) by mouth daily. 07/26/23   Sherlene Shams, MD  fish oil-omega-3 fatty acids 1000 MG capsule Take by mouth daily.      [provider]  fluconazole (DIFLUCAN) 150 MG tablet Take 1 tablet (150 mg total) by mouth daily. For yeast infection/vaginitis 07/26/23   Sherlene Shams, MD  latanoprost (XALATAN) 0.005 % ophthalmic solution daily. 01/01/21   [provider]  lidocaine (LIDODERM) 5 % Place 1 patch onto the skin daily. Remove & Discard patch within 12 hours or as directed by MD 07/26/23   Sherlene Shams, MD  metoprolol succinate (TOPROL-XL) 25 MG 24 hr tablet TAKE ONE AND ONE-HALF  TABLET BY MOUTH EVERY DAY/ due yearly visit. PLEASE CALL OFFICE TO SCHEDULE APPOINTMENT PRIOR TO NEXT REFILL (first attempt) 05/14/23   Iran Ouch, MD  Multiple Vitamin (MULTIVITAMIN) tablet Take 1 tablet by mouth daily.      [provider]  Multiple Vitamins-Minerals (EYE VITAMINS PO) Take 1 tablet by mouth daily.    [provider]  pravastatin (PRAVACHOL) 20 MG tablet TAKE ONE TABLET (20 MG) BY MOUTH EVERY EVENING 07/17/23   Sherlene Shams, MD  telmisartan (MICARDIS) 80 MG tablet Take 1 tablet (80 mg total) by mouth daily. 06/05/23   Sherlene Shams, MD   Physical Exam: Vitals:   08/23/23 1500 08/23/23 1519 08/23/23 1630 08/23/23 1730   BP: (!) 157/69  (!) 133/56 (!) 129/98  Pulse: 66  67 66  Resp: (!) 27  (!) 29 (!) 26  Temp:  98.5 F (36.9 C)    SpO2: 96%  93% 92%  Weight:      Height:       Constitutional: appears younger than chronological age, frail, NAD, calm Eyes: PERRL, lids and conjunctivae normal ENMT: Mucous membranes are moist. Posterior pharynx clear of any exudate or lesions. Age-appropriate dentition. Hearing appropriate Neck: normal, supple, no masses, no thyromegaly Respiratory: clear to auscultation bilaterally, no wheezing, no crackles. Normal respiratory effort. No accessory muscle use.  Cardiovascular: Regular rate and rhythm, no murmurs / rubs / gallops. No extremity edema. 2+ pedal pulses. No carotid bruits.  Abdomen: no tenderness, no masses palpated, no hepatosplenomegaly. Bowel sounds positive.  Musculoskeletal: no clubbing / cyanosis. No joint deformity upper and lower extremities. Good ROM, no contractures, no atrophy. Normal muscle tone.  Skin: no rashes, lesions, ulcers. No induration Neurologic: Sensation intact. Strength 5/5 in all 4.  Psychiatric: Normal judgment and insight. Alert and oriented x 3. Normal mood.   EKG: independently reviewed, showing ST elevation in leads III, aVR, aVF, V1-V6 and ST depression in leads I, II, aVL.  Left bundle branch block.  Rate is 71, QTc 517  Chest x-ray on Admission: I personally reviewed and I agree with radiologist reading as below.  DG Chest 2 View Result Date: 08/23/2023 CLINICAL DATA:  Shortness of breath. EXAM: CHEST - 2 VIEW COMPARISON:  September 14, 2012. FINDINGS: Stable cardiomediastinal silhouette. Moderate thoracic kyphosis of thoracic spine is noted. Minimal interstitial densities are noted throughout both lungs which may represent scarring, but acute atypical inflammation or edema cannot be excluded. IMPRESSION: Minimal interstitial densities are noted throughout both lungs which may represent scarring, but acute atypical inflammation or  edema cannot be excluded. Aortic Atherosclerosis (ICD10-I70.0). Electronically Signed   By: Lupita Raider M.D.   On: 08/23/2023 16:55    Labs on Admission: I have personally reviewed following labs CBC: Recent Labs  Lab 08/23/23 1412  WBC 5.8  HGB 15.5*  HCT 46.9*  MCV 93.4  PLT 159   Basic Metabolic Panel: Recent Labs  Lab 08/23/23 1412  NA 136  K 4.2  CL 100  CO2 26  GLUCOSE 119*  BUN 24*  CREATININE 1.12*  CALCIUM 9.4   GFR: Estimated Creatinine Clearance: 22 mL/min (A) (by C-G formula based on SCr of 1.12 mg/dL (H)).  Urine analysis:    Component Value Date/Time   COLORURINE Dark Yellow (A) 05/02/2023 1408   APPEARANCEUR Sl Cloudy (A) 05/02/2023 1408   LABSPEC 1.025 05/02/2023 1408   PHURINE 6.0 05/02/2023 1408   GLUCOSEU NEGATIVE 05/02/2023 1408   HGBUR NEGATIVE 05/02/2023 1408  BILIRUBINUR NEGATIVE 05/02/2023 1408   KETONESUR TRACE (A) 05/02/2023 1408   PROTEINUR NEGATIVE 04/17/2023 1137   UROBILINOGEN 0.2 05/02/2023 1408   NITRITE NEGATIVE 05/02/2023 1408   LEUKOCYTESUR NEGATIVE 05/02/2023 1408   This document was prepared using Dragon Voice Recognition software and may include unintentional dictation errors.  Dr. Sedalia Muta [facility]  If 7PM-7AM, please contact overnight-coverage provider If 7AM-7PM, please contact day attending provider [email or web address]  08/23/2023, 5:53 PM

## 2023-08-23 NOTE — ED Notes (Signed)
 This RN gave [name] RN report and performed bedside care handoff. Call light in reach, bed wheels locked, side rail raised, pt updated on plan of care. Rounding completed. Pt eating dinner. Family bedside.

## 2023-08-23 NOTE — Assessment & Plan Note (Deleted)
 With dyspnea and weakness for several days Patient reports the symptoms have resolved since being in the hospital EKG was concerning for ischemic changes High since troponin was initially 16 and pending a second level [facility] cardiology specialist has been consulted, Dr. Cristal Deer is aware Admit to PCU, inpatient

## 2023-08-23 NOTE — Assessment & Plan Note (Addendum)
 Home Plavix 75 mg daily, pravastatin 20 mg every evening resumed on admission

## 2023-08-23 NOTE — Assessment & Plan Note (Signed)
 Continue airborne and contact precautions

## 2023-08-23 NOTE — Assessment & Plan Note (Signed)
 Home pravastatin 20 mg every evening resumed on admission

## 2023-08-23 NOTE — Assessment & Plan Note (Addendum)
 With weakness for 1 week, progressively worsened Patient reports the symptoms have resolved since being in the hospital EKG was concerning for ischemic changes.  STEMI cardiologist was called and does not recommend STEMI activation at this time High since troponin was initially 16 and pending a second level [facility] cardiology specialist has been consulted, Dr. Cristal Deer is aware Admit to PCU, inpatient

## 2023-08-23 NOTE — Assessment & Plan Note (Addendum)
 Baseline no prior CKD Baseline serum creatinine is 0.84-1.13, eGFR > 60 Suspect cardiorenal, complete echo ordered Home ARB not resumed on admission in setting of AKI, a.m. team to resume when the benefits outweigh the risk Strict I's and O's Recheck BMP in a.m.

## 2023-08-23 NOTE — ED Notes (Signed)
 This RN introduced self to pt. Call light in reach, bed wheels locked, side rail raised, pt updated on plan of care. Rounding completed.

## 2023-08-23 NOTE — ED Notes (Signed)
 Patient provided with an inpatient bed for comfort while awaiting room on the inpatient floor. Patient able to demonstrate use of bed and call bell use. Patient ambulated to restroom with 1 assist and on oxygen.

## 2023-08-23 NOTE — ED Triage Notes (Signed)
 Pt via POV from home. Pt c/o SOB and weakness for the past day. Reports that she dx with COVID and bronchitis on Friday. Denies any pain. Was prescribed medication for COVID and bronchitis with no changes.  Denies any hx of COPD or CHF. Pt is A&Ox4 and NAD.

## 2023-08-23 NOTE — Hospital Course (Addendum)
 [name] is a 89 year old female with history of CAD, hypertension, hyperlipidemia, hyperglycemia intolerance, who presents emergency department for chief concerns of shortness of breath, weakness for the past few days.  Vitals in the ED showed temperature 98.5, respiration rate 20, heart rate 71, blood pressure 116/59, SpO2 of 91% on room air.  Serum sodium is 136, potassium 4.2, chloride 100, bicarb 26, BUN of 24, serum creatinine of 1.12, EGFR 44, nonfasting blood glucose 119, WBC 5.8, hemoglobin 15.5, platelets of 159.  ED treatment: None.  EDP discussed with STEMI cardiologist, Dr. Herbie Baltimore, who per ED documentation agreed that EKG is concerning for ischemia and advise based on patient's age, without elevated troponin, STEMI team activation not recommended at this time.

## 2023-08-23 NOTE — ED Provider Notes (Signed)
-----------------------------------------   2:21 PM on 08/23/2023 -----------------------------------------  I was asked to review and sign the EKG from triage.  This shows an LBBB but with new ST elevations in multiple leads concerning for acute ischemia.  However, the patient has no chest pain.  I consulted and discussed the case with Dr. Herbie Baltimore from cardiology who is on-call for the STEMI team.  He reviewed the EKG as well.  He agreed that the EKG is concerning for ischemia, advised that based on the patient's age, the fact that she is presenting with shortness of breath, currently has COVID, and has no chest pain, he would not recommend STEMI team activation or emergent catheterization.  ED ECG REPORT I, Dionne Bucy, the attending physician, personally viewed and interpreted this ECG.  Date: 08/23/2023 EKG Time: 1353 Rate: 71 Rhythm: normal sinus rhythm QRS Axis: normal Intervals: LBBB ST/T Wave abnormalities: ST elevations in lead III, V3, V4, V5 Narrative Interpretation: ST elevations in multiple leads concerning for acute ischemia    Dionne Bucy, MD 08/23/23 1423

## 2023-08-23 NOTE — Assessment & Plan Note (Signed)
 Home amlodipine 5 mg daily, metoprolol succinate 37.5 mg daily were resumed this admission Home ARB not resumed on admission in setting of AKI, a.m. team to resume when the benefits outweigh the risk Hydralazine 5 mg IV every 6 hours as needed for SBP greater than 165, 5 days ordered

## 2023-08-24 ENCOUNTER — Inpatient Hospital Stay (HOSPITAL_COMMUNITY)
Admit: 2023-08-24 | Discharge: 2023-08-24 | Disposition: A | Discharge status: Home / Self Care | Attending: Internal Medicine | Admitting: Internal Medicine

## 2023-08-24 DIAGNOSIS — I10 Essential (primary) hypertension: Secondary | ICD-10-CM | POA: Diagnosis not present

## 2023-08-24 DIAGNOSIS — I428 Other cardiomyopathies: Secondary | ICD-10-CM

## 2023-08-24 DIAGNOSIS — N179 Acute kidney failure, unspecified: Secondary | ICD-10-CM | POA: Diagnosis not present

## 2023-08-24 DIAGNOSIS — U071 COVID-19: Secondary | ICD-10-CM

## 2023-08-24 DIAGNOSIS — R9431 Abnormal electrocardiogram [ECG] [EKG]: Secondary | ICD-10-CM

## 2023-08-24 DIAGNOSIS — R079 Chest pain, unspecified: Secondary | ICD-10-CM

## 2023-08-24 DIAGNOSIS — R0602 Shortness of breath: Secondary | ICD-10-CM | POA: Diagnosis not present

## 2023-08-24 LAB — BASIC METABOLIC PANEL
Anion gap: 8 (ref 5–15)
BUN: 25 mg/dL — ABNORMAL HIGH (ref 8–23)
CO2: 23 mmol/L (ref 22–32)
Calcium: 8.2 mg/dL — ABNORMAL LOW (ref 8.9–10.3)
Chloride: 101 mmol/L (ref 98–111)
Creatinine, Ser: 0.93 mg/dL (ref 0.44–1.00)
GFR, Estimated: 56 mL/min — ABNORMAL LOW (ref >60)
Glucose, Bld: 102 mg/dL — ABNORMAL HIGH (ref 70–99)
Potassium: 3.6 mmol/L (ref 3.5–5.1)
Sodium: 132 mmol/L — ABNORMAL LOW (ref 135–145)

## 2023-08-24 LAB — CBC
HCT: 42.6 % (ref 36.0–46.0)
Hemoglobin: 14.5 g/dL (ref 12.0–15.0)
MCH: 32 pg (ref 26.0–34.0)
MCHC: 34 g/dL (ref 30.0–36.0)
MCV: 94 fL (ref 80.0–100.0)
Platelets: 139 10*3/uL — ABNORMAL LOW (ref 150–400)
RBC: 4.53 MIL/uL (ref 3.87–5.11)
RDW: 13.7 % (ref 11.5–15.5)
WBC: 5 10*3/uL (ref 4.0–10.5)
nRBC: 0 % (ref 0.0–0.2)

## 2023-08-24 LAB — ECHOCARDIOGRAM COMPLETE
AR max vel: 1.6 cm2
AV Area VTI: 1.33 cm2
AV Area mean vel: 1.22 cm2
AV Mean grad: 9 mmHg
AV Peak grad: 15.8 mmHg
Ao pk vel: 1.99 m/s
Area-P 1/2: 3.53 cm2
Height: 60 in
MV VTI: 1.89 cm2
P 1/2 time: 620 ms
S' Lateral: 2.2 cm
Weight: 1968 [oz_av]

## 2023-08-24 MED ORDER — GUAIFENESIN 100 MG/5ML PO LIQD: 5.0000 mL | ORAL | 0 refills | Status: DC | PRN

## 2023-08-24 NOTE — Discharge Summary (Signed)
 Physician Discharge Summary   Patient: [name] MRN: [id number] DOB: 1922/06/28  Admit date:     08/23/2023  Discharge date: 08/25/23  Discharge Physician: Marcelino Duster   PCP: Sherlene Shams, MD   Recommendations at discharge:    PCP follow up in 1 week.  Discharge Diagnoses: Principal Problem:   Shortness of breath Active Problems:   Mixed hyperlipidemia   Essential hypertension   Nonischemic cardiomyopathy (HCC)   TIA (transient ischemic attack)   Arteriosclerotic cerebrovascular disease   COVID-19 virus infection   AKI (acute kidney injury) (HCC)   Abnormal EKG  Resolved Problems:   Statin intolerance   Chest pain  Hospital Course: [name] is a 89 year old female with history of CAD, hypertension, hyperlipidemia, hyperglycemia intolerance, who presents emergency department for chief concerns of shortness of breath, weakness for the past few days.  Vitals in the ED showed temperature 98.5, respiration rate 20, heart rate 71, blood pressure 116/59, SpO2 of 91% on room air.  Serum sodium is 136, potassium 4.2, chloride 100, bicarb 26, BUN of 24, serum creatinine of 1.12, EGFR 44, nonfasting blood glucose 119, WBC 5.8, hemoglobin 15.5, platelets of 159.  ED treatment: None.  EDP discussed with STEMI cardiologist, Dr. Herbie Baltimore, who per ED documentation agreed that EKG is concerning for ischemia and advise based on patient's age, without elevated troponin, STEMI team activation not recommended at this time.  Assessment and Plan: * Shortness of breath EKG changes- She denies chest pain. Troponin flat negative. She has a known left bundle branch block, though ST elevations seen yesterday, particularly in the inferior leads, are concerning for ischemia.  Cardiology evaluated advised no ischemic work up as echo with preserved EF, advanced age and she remains asymptomatic. I advised her to follow up with PCP upon discharge, she understands and agrees  COVID  19 infection She is not hypoxic. Able to ambulate well. Anti tussives, tylenol suggested. Isolation precautions advised.  AKI (acute kidney injury) (HCC) Back to baseline. Recheck BMP as outpatient. ARB resumed.  TIA (transient ischemic attack) Resumed Plavix 75 mg daily, pravastatin 20 mg upon discharge.  Essential hypertension Resumed home medications norvasc, metoprolol, telmisartan.  Mixed hyperlipidemia continue pravastatin 20 mg.        Consultants: Cardiology Procedures performed: none  Disposition: Home Diet recommendation:  Discharge Diet Orders (From admission, onward)     Start     Ordered   08/24/23 0000  Diet - low sodium heart healthy        08/24/23 1420           Cardiac diet DISCHARGE MEDICATION: Allergies as of 08/24/2023       Reactions   Contrast Media [iodinated Contrast Media] Rash, Other (See Comments)   Swelling of the body.   Sulfa Antibiotics Hives, Swelling, Rash        Medication List     STOP taking these medications    azithromycin 250 MG tablet Commonly known as: ZITHROMAX   fluconazole 150 MG tablet Commonly known as: DIFLUCAN       TAKE these medications    amLODipine 5 MG tablet Commonly known as: NORVASC TAKE ONE (1) TABLET BY MOUTH TWO TIMES PER DAY   benzonatate 200 MG capsule Commonly known as: TESSALON Take 200 mg by mouth 3 (three) times daily as needed for cough.   Calcium Carbonate-Vitamin D 600-200 MG-UNIT Tabs Take 1 tablet by mouth daily.   clopidogrel 75 MG tablet Commonly known as: PLAVIX Take  1 tablet (75 mg total) by mouth daily.   EYE VITAMINS PO Take 1 tablet by mouth daily.   fish oil-omega-3 fatty acids 1000 MG capsule Take 1 g by mouth daily.   guaiFENesin 100 MG/5ML liquid Commonly known as: ROBITUSSIN Take 5 mLs by mouth every 4 (four) hours as needed for cough or to loosen phlegm.   latanoprost 0.005 % ophthalmic solution Commonly known as: XALATAN Place 1 drop into  both eyes at bedtime.   lidocaine 5 % Commonly known as: LIDODERM Place 1 patch onto the skin daily. Remove & Discard patch within 12 hours or as directed by MD   metoprolol succinate 25 MG 24 hr tablet Commonly known as: TOPROL-XL TAKE ONE AND ONE-HALF TABLET BY MOUTH EVERY DAY/ due yearly visit. PLEASE CALL OFFICE TO SCHEDULE APPOINTMENT PRIOR TO NEXT REFILL (first attempt)   multivitamin tablet Take 1 tablet by mouth daily.   pravastatin 20 MG tablet Commonly known as: PRAVACHOL TAKE ONE TABLET (20 MG) BY MOUTH EVERY EVENING   predniSONE 10 MG tablet Commonly known as: DELTASONE Take 10 mg by mouth 2 (two) times daily.   telmisartan 80 MG tablet Commonly known as: MICARDIS Take 1 tablet (80 mg total) by mouth daily.        Follow-up Information     Sherlene Shams, MD Follow up in 1 week(s).   Specialty: Internal Medicine Contact information: [address] [place] [address] [phone]                Discharge Exam: Ceasar Mons Weights   08/23/23 1349  Weight: 55.8 kg      08/24/2023    7:40 AM 08/24/2023    4:15 AM 08/24/2023   12:15 AM  Vitals with BMI  Systolic 147 135 914  Diastolic 72 56 50  Pulse 78 73 66    General - Elderly Caucasian female, no apparent distress HEENT - PERRLA, EOMI, atraumatic head, non tender sinuses. Lung - Clear, basal rhonchi, no wheezes. Heart - S1, S2 heard, no murmurs, rubs, no pedal edema. Abdomen - Soft, non tender, bowel sounds good Neuro - Alert, awake and oriented x 3, non focal exam. Skin - Warm and dry.  Condition at discharge: stable  The results of significant diagnostics from this hospitalization (including imaging, microbiology, ancillary and laboratory) are listed below for reference.   Imaging Studies: ECHOCARDIOGRAM COMPLETE Result Date: 08/24/2023    ECHOCARDIOGRAM REPORT   Patient Name:   [name] Date of Exam: 08/24/2023 Medical Rec #:  [id number]      Height:       60.0 in  Accession #:    [id number]     Weight:       123.0 lb Date of Birth:  1922/07/11      BSA:          1.518 m Patient Age:    89 years       BP:           142/72 mmHg Patient Gender: F              HR:           77 bpm. Exam Location:  [facility] Procedure: 2D Echo, Cardiac Doppler and Color Doppler (Both Spectral and Color            Flow Doppler were utilized during procedure). Indications:     Abnormal ECG, Chest Pain  History:  Patient has prior history of Echocardiogram examinations, most                  recent 04/24/2014. CHF and Cardiomyopathy, Abnormal ECG, TIA,                  Signs/Symptoms:Syncope, Chest Pain, Dyspnea and Shortness of                  Breath; Risk Factors:Hypertension and Dyslipidemia. COVID +,                  CKD.  Sonographer:     Mikki Harbor Referring Phys:  [id number] AMY N COX Diagnosing Phys: Debbe Odea MD  Sonographer Comments: Suboptimal subcostal window. Image acquisition challenging due to respiratory motion. IMPRESSIONS  1. Left ventricular ejection fraction, by estimation, is 50 to 55%. The left ventricle has low normal function. The left ventricle has no regional wall motion abnormalities. There is mild left ventricular hypertrophy. Left ventricular diastolic parameters are consistent with Grade I diastolic dysfunction (impaired relaxation).  2. Right ventricular systolic function is normal. The right ventricular size is normal. There is normal pulmonary artery systolic pressure.  3. Left atrial size was mildly dilated.  4. The mitral valve is normal in structure. Mild mitral valve regurgitation.  5. The aortic valve is calcified. Aortic valve regurgitation is moderate. Aortic valve sclerosis/calcification is present, without any evidence of aortic stenosis. Aortic valve mean gradient measures 9.0 mmHg. FINDINGS  Left Ventricle: Left ventricular ejection fraction, by estimation, is 50 to 55%. The left ventricle has low normal function. The left ventricle has no  regional wall motion abnormalities. Strain was performed and the global longitudinal strain is indeterminate. The left ventricular internal cavity size was normal in size. There is mild left ventricular hypertrophy. Abnormal (paradoxical) septal motion, consistent with left bundle branch block. Left ventricular diastolic parameters are consistent with Grade I diastolic dysfunction (impaired relaxation). Right Ventricle: The right ventricular size is normal. No increase in right ventricular wall thickness. Right ventricular systolic function is normal. There is normal pulmonary artery systolic pressure. The tricuspid regurgitant velocity is 2.02 m/s, and  with an assumed right atrial pressure of 8 mmHg, the estimated right ventricular systolic pressure is 24.3 mmHg. Left Atrium: Left atrial size was mildly dilated. Right Atrium: Right atrial size was not well visualized. Pericardium: There is no evidence of pericardial effusion. Mitral Valve: The mitral valve is normal in structure. Mild mitral valve regurgitation. MV peak gradient, 5.2 mmHg. The mean mitral valve gradient is 2.0 mmHg. Tricuspid Valve: The tricuspid valve is normal in structure. Tricuspid valve regurgitation is not demonstrated. Aortic Valve: The aortic valve is calcified. Aortic valve regurgitation is moderate. Aortic regurgitation PHT measures 620 msec. Aortic valve sclerosis/calcification is present, without any evidence of aortic stenosis. Aortic valve mean gradient measures  9.0 mmHg. Aortic valve peak gradient measures 15.8 mmHg. Aortic valve area, by VTI measures 1.33 cm. Pulmonic Valve: The pulmonic valve was not well visualized. Pulmonic valve regurgitation is not visualized. Aorta: The aortic root is normal in size and structure. Venous: The inferior vena cava was not well visualized. IAS/Shunts: No atrial level shunt detected by color flow Doppler. Additional Comments: 3D was performed not requiring image post processing on an independent  workstation and was indeterminate.  LEFT VENTRICLE PLAX 2D LVIDd:         2.90 cm   Diastology LVIDs:         2.20 cm   LV  e' medial:    6.20 cm/s LV PW:         1.20 cm   LV E/e' medial:  5.8 LV IVS:        1.30 cm   LV e' lateral:   6.09 cm/s LVOT diam:     1.80 cm   LV E/e' lateral: 5.9 LV SV:         49 LV SV Index:   33 LVOT Area:     2.54 cm  RIGHT VENTRICLE RV Basal diam:  3.40 cm RV Mid diam:    2.80 cm RV S prime:     15.30 cm/s TAPSE (M-mode): 2.3 cm LEFT ATRIUM             Index        RIGHT ATRIUM           Index LA diam:        3.50 cm 2.31 cm/m   RA Area:     10.50 cm LA Vol (A2C):   26.8 ml 17.65 ml/m  RA Volume:   20.30 ml  13.37 ml/m LA Vol (A4C):   50.9 ml 33.53 ml/m LA Biplane Vol: 36.9 ml 24.31 ml/m  AORTIC VALVE                     PULMONIC VALVE AV Area (Vmax):    1.60 cm      PV Vmax:       0.93 m/s AV Area (Vmean):   1.22 cm      PV Peak grad:  3.4 mmHg AV Area (VTI):     1.33 cm AV Vmax:           199.00 cm/s AV Vmean:          141.000 cm/s AV VTI:            0.372 m AV Peak Grad:      15.8 mmHg AV Mean Grad:      9.0 mmHg LVOT Vmax:         125.00 cm/s LVOT Vmean:        67.400 cm/s LVOT VTI:          0.194 m LVOT/AV VTI ratio: 0.52 AI PHT:            620 msec  AORTA Ao Root diam: 3.20 cm MITRAL VALVE               TRICUSPID VALVE MV Area (PHT): 3.53 cm    TR Peak grad:   16.3 mmHg MV Area VTI:   1.89 cm    TR Vmax:        202.00 cm/s MV Peak grad:  5.2 mmHg MV Mean grad:  2.0 mmHg    SHUNTS MV Vmax:       1.14 m/s    Systemic VTI:  0.19 m MV Vmean:      57.6 cm/s   Systemic Diam: 1.80 cm MV Decel Time: 215 msec MV E velocity: 36.20 cm/s MV A velocity: 98.00 cm/s MV E/A ratio:  0.37 Debbe Odea MD Electronically signed by Debbe Odea MD Signature Date/Time: 08/24/2023/10:23:30 AM    Final    DG Chest 2 View Result Date: 08/23/2023 CLINICAL DATA:  Shortness of breath. EXAM: CHEST - 2 VIEW COMPARISON:  September 14, 2012. FINDINGS: Stable cardiomediastinal silhouette.  Moderate thoracic kyphosis of thoracic spine is noted. Minimal interstitial densities are noted throughout both lungs which may represent scarring, but acute  atypical inflammation or edema cannot be excluded. IMPRESSION: Minimal interstitial densities are noted throughout both lungs which may represent scarring, but acute atypical inflammation or edema cannot be excluded. Aortic Atherosclerosis (ICD10-I70.0). Electronically Signed   By: Lupita Raider M.D.   On: 08/23/2023 16:55    Microbiology: Results for orders placed or performed during the hospital encounter of 08/23/23  SARS Coronavirus 2 by RT PCR (hospital order, performed in [facility] hospital lab) *cepheid single result test* Anterior Nasal Swab     Status: Abnormal   Collection Time: 08/23/23  9:10 PM   Specimen: Anterior Nasal Swab  Result Value Ref Range Status   SARS Coronavirus 2 by RT PCR POSITIVE (A) NEGATIVE Final    Comment: (NOTE) SARS-CoV-2 target nucleic acids are DETECTED  SARS-CoV-2 RNA is generally detectable in upper respiratory specimens  during the acute phase of infection.  Positive results are indicative  of the presence of the identified virus, but do not rule out bacterial infection or co-infection with other pathogens not detected by the test.  Clinical correlation with patient history and  other diagnostic information is necessary to determine patient infection status.  The expected result is negative.  Fact Sheet for Patients:   RoadLapTop.co.za   Fact Sheet for Healthcare Providers:   http://kim-miller.com/    This test is not yet approved or cleared by the [place] FDA and  has been authorized for detection and/or diagnosis of SARS-CoV-2 by FDA under an Emergency Use Authorization (EUA).  This EUA will remain in effect (meaning this test can be used) for the duration of  the COVID-19 declaration under Section 564(b)(1)  of the Act, 21 U.S.C.  section 360-bbb-3(b)(1), unless the authorization is terminated or revoked sooner.   Performed at [facility], [address] [place], [place] [address]     Labs: CBC: Recent Labs  Lab 08/23/23 1412 08/24/23 0644  WBC 5.8 5.0  HGB 15.5* 14.5  HCT 46.9* 42.6  MCV 93.4 94.0  PLT 159 139*   Basic Metabolic Panel: Recent Labs  Lab 08/23/23 1412 08/24/23 0644  NA 136 132*  K 4.2 3.6  CL 100 101  CO2 26 23  GLUCOSE 119* 102*  BUN 24* 25*  CREATININE 1.12* 0.93  CALCIUM 9.4 8.2*   Liver Function Tests: No results for input(s): "AST", "ALT", "ALKPHOS", "BILITOT", "PROT", "ALBUMIN" in the last 168 hours. CBG: No results for input(s): "GLUCAP" in the last 168 hours.  Discharge time spent:  33 minutes.  Signed: Marcelino Duster, MD [facility] 08/24/2023

## 2023-08-24 NOTE — ED Notes (Signed)
 Pt ambulated to the bathroom and back with this RN as standby. Pt was independent and steady. No increased WOB - when placed back on the monitor pt was 86% on room air. 2L  placed on pt and recovered quickly to 95%.

## 2023-08-24 NOTE — Care Management Obs Status (Signed)
 MEDICARE OBSERVATION STATUS NOTIFICATION   Patient Details  Name: [name] MRN: [id number] Date of Birth: [birth year]   Medicare Observation Status Notification Given:  Yes    Margarito Liner, LCSW 08/24/2023, 3:35 PM

## 2023-08-24 NOTE — Consult Note (Signed)
 Cardiology Consultation   Patient ID: [name] MRN: [id number]; DOB: April 08, 1923  Admit date: 08/23/2023 Date of Consult: 08/24/2023  PCP:  Sherlene Shams, MD   [facility] Providers Cardiologist:  Lorine Bears, MD        Patient Profile:   [name] is a 89 y.o. female with a hx of non ischemic cardiomyopathy, nonobstructive CAD, TIA, hypertension, and hyperlipidemia who is being seen 08/24/2023 for the evaluation of abnormal EKG at the request of Dr. Clide Dales.  History of Present Illness:   [name] had cardiac catheterization 10/2012 which showed mild nonobstructive CAD involving the LAD and left circumflex.  EF at that time was normal.  She does have a chronic left bundle branch block.  Echo 04/2014 showed EF 45 to 50%, mild to moderate AR, mild TR.  Pulmonary pressures normal.  She had TIA symptoms 11/2018.  Plavix was added to aspirin.  Doppler showed mild nonobstructive carotid disease.  MRI of the brain showed no evidence of acute or subacute infarct.  She was noted to have widespread intracranial atherosclerotic disease with possible flow-limiting stenosis to the proximal right MCA and proximal left PCA.  She had outpatient monitor which showed no evidence of A-fib although she was found to have frequent short episodes of SVT.  Most recently seen by Dr. Renato Gails on 04/2022 at which time she was doing well from a cardiac perspective.  She does have intolerance to statins and Zetia.  However tolerating pravastatin twice daily.  No indication for more aggressive lipid management considering age.  She was seen by primary care on 2/25 and diagnosed with COVID-19 as well as bacterial sinusitis. She was prescribed Tessalon Perles, prednisone, and azithromycin. However, these prescriptions have been completed and she is not feeling better.  Presented to the ED 3/6 with complaints of worsening fatigue, weakness, and dyspnea. In the ED BP 116/59, HR 71, RR 20, SpO2 91%. BMP with  creatinine 1.12, BUN 24.  CBC largely unremarkable. EKG shows sinus rhythm with chronic left bundle branch block and new ST elevations in diffuse leads. However, troponin negative. Cardiologist on STEMI call was contacted and given patient age, COVID-19 status, lack of chest pain, and negative troponins code STEMI was not activated.  Patient was started on IV heparin which was later stopped.  Patient denies chest pain, palpitations, nausea, and vomiting.   Past Medical History:  Diagnosis Date   Carotid artery disease (HCC)    Coronary artery disease, non-occlusive    Hyperlipidemia    Hypertension    Left bundle branch block    Nonischemic cardiomyopathy (HCC)     Past Surgical History:  Procedure Laterality Date   ABDOMINAL HYSTERECTOMY     ankle     rods in both ankles   BREAST BIOPSY Bilateral YRS AGO   NEG   CARDIAC CATHETERIZATION     [facility]   left ankle  Octo 2012   s/p pinning , Krazinski   LEFT HEART CATHETERIZATION WITH CORONARY ANGIOGRAM N/A 10/31/2012   Procedure: LEFT HEART CATHETERIZATION WITH CORONARY ANGIOGRAM;  Surgeon: Robynn Pane, MD;  Location: [facility];  Service: Cardiovascular;  Laterality: N/A;       Inpatient Medications: Scheduled Meds:  amLODipine  5 mg Oral Daily   clopidogrel  75 mg Oral Daily   heparin  5,000 Units Subcutaneous Q8H   metoprolol succinate  37.5 mg Oral Daily   pravastatin  20 mg Oral q1800   Continuous Infusions:  PRN Meds: acetaminophen **OR** acetaminophen, benzonatate, hydrALAZINE, ondansetron **OR** ondansetron (ZOFRAN) IV, senna-docusate  Allergies:    Allergies  Allergen Reactions   Contrast Media [Iodinated Contrast Media] Rash and Other (See Comments)    Swelling of the body.   Sulfa Antibiotics Hives, Swelling and Rash    Social History:   Social History   Socioeconomic History   Marital status: Widowed    Spouse name: Not on file   Number of children: 1   Years of education: Not on file   Highest  education level: Not on file  Occupational History   Occupation: retired  Tobacco Use   Smoking status: Never   Smokeless tobacco: Never  Vaping Use   Vaping status: Never Used  Substance and Sexual Activity   Alcohol use: No   Drug use: No   Sexual activity: Not Currently  Other Topics Concern   Not on file  Social History Narrative   Moved to retirement home 03/2023. Has 1 daughter.   Social Drivers of Corporate investment banker Strain: Low Risk  (04/18/2023)   Overall Financial Resource Strain (CARDIA)    Difficulty of Paying Living Expenses: Not hard at all  Food Insecurity: No Food Insecurity (04/18/2023)   Hunger Vital Sign    Worried About Running Out of Food in the Last Year: Never true    Ran Out of Food in the Last Year: Never true  Transportation Needs: No Transportation Needs (04/18/2023)   PRAPARE - Administrator, Civil Service (Medical): No    Lack of Transportation (Non-Medical): No  Physical Activity: Insufficiently Active (04/18/2023)   Exercise Vital Sign    Days of Exercise per Week: 1 day    Minutes of Exercise per Session: 20 min  Stress: No Stress Concern Present (04/18/2023)   [employer] of Occupational Health - Occupational Stress Questionnaire    Feeling of Stress : Not at all  Social Connections: Moderately Isolated (04/18/2023)   Social Connection and Isolation Panel [NHANES]    Frequency of Communication with Friends and Family: More than three times a week    Frequency of Social Gatherings with Friends and Family: More than three times a week    Attends Religious Services: More than 4 times per year    Active Member of [employer] or Organizations: No    Attends Banker Meetings: Never    Marital Status: Widowed  Intimate Partner Violence: Not At Risk (04/18/2023)   Humiliation, Afraid, Rape, and Kick questionnaire    Fear of Current or Ex-Partner: No    Emotionally Abused: No    Physically Abused: No     Sexually Abused: No    Family History:    Family History  Problem Relation Age of Onset   Hypertension Mother    Heart attack Mother    Hypertension Father    Cancer Father        stomach   Heart attack Brother    Stroke Sister    Breast cancer Neg Hx      ROS:  Please see the history of present illness.   Physical Exam/Data:   Vitals:   08/23/23 2000 08/24/23 0015 08/24/23 0415 08/24/23 0740  BP: 132/69 (!) 123/50 (!) 135/56 (!) 147/72  Pulse: 72 66 73 78  Resp: (!) 23 20 (!) 26 18  Temp: 98.2 F (36.8 C)   98.4 F (36.9 C)  TempSrc: Oral   Oral  SpO2: 92% 95% 91% 97%  Weight:      Height:       No intake or output data in the 24 hours ending 08/24/23 0807    08/23/2023    1:49 PM 07/26/2023   11:45 AM 06/05/2023    4:28 PM  Last 3 Weights  Weight (lbs) 123 lb 129 lb 12.8 oz 129 lb 6.4 oz  Weight (kg) 55.792 kg 58.877 kg 58.695 kg     Body mass index is 24.02 kg/m.  General:  Well nourished, well developed, in no acute distress HEENT: normal Neck: no JVD Cardiac:  normal S1, S2; RRR; no murmur  Lungs:  clear to auscultation bilaterally, no wheezing, rhonchi or rales  Abd: soft, nontender, no hepatomegaly  Ext: no edema Skin: warm and dry  Psych:  Normal affect   EKG:  The EKG was personally reviewed and demonstrates:  Sinus rhythm with chronic LBBB and new diffuse ST elevation Telemetry:  Telemetry was personally reviewed and demonstrates:  sinus rhythm with LBBB, PACs, and PVCs  Relevant CV Studies:  08/24/2023 Echo complete 1. Left ventricular ejection fraction, by estimation, is 50 to 55%. The  left ventricle has low normal function. The left ventricle has no regional  wall motion abnormalities. There is mild left ventricular hypertrophy.  Left ventricular diastolic  parameters are consistent with Grade I diastolic dysfunction (impaired  relaxation).   2. Right ventricular systolic function is normal. The right ventricular  size is normal. There is  normal pulmonary artery systolic pressure.   3. Left atrial size was mildly dilated.   4. The mitral valve is normal in structure. Mild mitral valve  regurgitation.   5. The aortic valve is calcified. Aortic valve regurgitation is moderate.  Aortic valve sclerosis/calcification is present, without any evidence of  aortic stenosis. Aortic valve mean gradient measures 9.0 mmHg.   Laboratory Data:  High Sensitivity Troponin:   Recent Labs  Lab 08/23/23 1412 08/23/23 1607  TROPONINIHS 16 15     Chemistry Recent Labs  Lab 08/23/23 1412 08/24/23 0644  NA 136 132*  K 4.2 3.6  CL 100 101  CO2 26 23  GLUCOSE 119* 102*  BUN 24* 25*  CREATININE 1.12* 0.93  CALCIUM 9.4 8.2*  GFRNONAA 44* 56*  ANIONGAP 10 8    No results for input(s): "PROT", "ALBUMIN", "AST", "ALT", "ALKPHOS", "BILITOT" in the last 168 hours. Lipids No results for input(s): "CHOL", "TRIG", "HDL", "LABVLDL", "LDLCALC", "CHOLHDL" in the last 168 hours.  Hematology Recent Labs  Lab 08/23/23 1412 08/24/23 0644  WBC 5.8 5.0  RBC 5.02 4.53  HGB 15.5* 14.5  HCT 46.9* 42.6  MCV 93.4 94.0  MCH 30.9 32.0  MCHC 33.0 34.0  RDW 13.8 13.7  PLT 159 139*   Thyroid No results for input(s): "TSH", "FREET4" in the last 168 hours.  BNPNo results for input(s): "BNP", "PROBNP" in the last 168 hours.  DDimer No results for input(s): "DDIMER" in the last 168 hours.   Radiology/Studies:  DG Chest 2 View Result Date: 08/23/2023 IMPRESSION: Minimal interstitial densities are noted throughout both lungs which may represent scarring, but acute atypical inflammation or edema cannot be excluded. Aortic Atherosclerosis (ICD10-I70.0). Electronically Signed   By: Lupita Raider M.D.   On: 08/23/2023 16:55   Assessment and Plan:   Abnormal EKG - Initial EKG showed sinus rhythm with chronic LBBB and new ST elevation in diffuse leads - On call cardiologist contacted yesterday and ultimate decision not to activate code STEMI - Troponin  negative - Denies chest pain - Echo this admission shows LVEF 50-55% without RWMA - Repeat EKG, further recommendations pending results  Dyspnea COVID-19 infection - Chief complaint of shortness of breath, fatigue, and weakness worsening for the past week - Diagnosed with COVID-19 on 2/28 and prescribed prednisone, benzonatate, and azithromycin by PCP which have all been completed - Repeat COVID-19 testing positive on this admission - Currently requiring 2 L supplemental O2 - Management per IM  Acute kidney injury - No known history of CKD - PTA ARB held at this time - Cr 1.12 on admission, now improved to 0.93  Nonischemic cardiomyopathy - Most recent echo 04/2014 with LVEF 45-50%, mild to moderate AR, mild MR, and mild to moderate TR with normal pulmonary pressure - Repeat echo shows LVEF 50-55% with mild LVH, G1DD, normal RV, mild MR, and moderate AR - Appears euvolemic on exam - Continue PTA metoprolol  Hx TIA - Continue PTA Plavix and statin  Hypertension - Continue to titrate PTA medications as needed  Hyperlipidemia - LDL 174, continue PTA statin - More aggressive lipid management not indicated given age  For questions or updates, please contact [facility] Please consult [email or web address] for contact info under    Signed, Orion Crook, PA-C  08/24/2023 8:07 AM

## 2023-08-24 NOTE — Care Management CC44 (Signed)
 Condition Code 44 Documentation Completed  Patient Details  Name: [name] MRN: [id number] Date of Birth: 15-Mar-1923   Condition Code 44 given:  Yes Patient signature on Condition Code 44 notice:  Yes Documentation of 2 MD's agreement:  Yes Code 44 added to claim:  Yes    Margarito Liner, LCSW 08/24/2023, 3:35 PM

## 2023-08-24 NOTE — Progress Notes (Signed)
*  PRELIMINARY RESULTS* Echocardiogram 2D Echocardiogram has been performed.  [name] 08/24/2023, 9:39 AM

## 2023-09-04 ENCOUNTER — Other Ambulatory Visit: Payer: Self-pay | Admitting: Cardiovascular Disease

## 2023-09-04 ENCOUNTER — Ambulatory Visit: Payer: Medicare Other | Admitting: Internal Medicine

## 2023-09-04 NOTE — Telephone Encounter (Signed)
 Pharmacy is requesting refill for this pt, but pt hasn't been seen since Nov, 2023. Could you find out if the doctor want to continue to refill this medication?  Please advise. Thank you

## 2023-09-04 NOTE — Telephone Encounter (Signed)
 Hi,   Could you schedule this patient an overdue annual appointment? The patient was last seen by Dr. Kirke Corin on 05-05-2022. Thank you so much.

## 2023-09-04 NOTE — Telephone Encounter (Signed)
LVM to schedule fu appt, please schedule 

## 2023-09-07 NOTE — Telephone Encounter (Signed)
 Left a message for the patient to call back.

## 2023-09-11 ENCOUNTER — Telehealth: Payer: Self-pay | Admitting: Cardiovascular Disease

## 2023-09-11 ENCOUNTER — Other Ambulatory Visit (HOSPITAL_COMMUNITY): Payer: Self-pay

## 2023-09-11 MED ORDER — METOPROLOL SUCCINATE ER 25 MG PO TB24
37.5000 mg | ORAL_TABLET | Freq: Every day | ORAL | 0 refills | Status: DC
  Filled 2023-09-11: qty 135, 90d supply, fill #0

## 2023-09-11 NOTE — Telephone Encounter (Signed)
 Left a message for the patient to call back.

## 2023-09-11 NOTE — Telephone Encounter (Signed)
*  STAT* If patient is at the pharmacy, call can be transferred to refill team.   1. Which medications need to be refilled? (please list name of each medication and dose if known) metoprolol succinate (TOPROL-XL) 25 MG 24 hr tablet   2. Which pharmacy/location (including street and city if local pharmacy) is medication to be sent to? [facility] - [place], [place] - [address]   3. Do they need a 30 day or 90 day supply? 90   Patient has apptr on 4/14

## 2023-09-18 ENCOUNTER — Telehealth: Payer: Self-pay | Admitting: Cardiovascular Disease

## 2023-09-18 ENCOUNTER — Other Ambulatory Visit (HOSPITAL_COMMUNITY): Payer: Self-pay

## 2023-09-18 MED ORDER — METOPROLOL SUCCINATE ER 25 MG PO TB24: 37.5000 mg | ORAL_TABLET | Freq: Every day | ORAL | 0 refills | Status: DC

## 2023-09-18 NOTE — Telephone Encounter (Signed)
*  STAT* If patient is at the pharmacy, call can be transferred to refill team.   1. Which medications need to be refilled? (please list name of each medication and dose if known)   metoprolol succinate (TOPROL-XL) 25 MG 24 hr tablet   2. Would you like to learn more about the convenience, safety, & potential cost savings by using the [facility]?   3. Are you open to using the [facility] (Type [facility]. ).  4. Which pharmacy/location (including street and city if local pharmacy) is medication to be sent to?  [facility] - [place], [place] - [address]   5. Do they need a 30 day or 90 day supply?   Patient stated she will be out of this medication on Saturday, 4/5 and has an appointment scheduled with [facility] on 4/16.

## 2023-10-01 ENCOUNTER — Ambulatory Visit: Admitting: Emergency Medicine

## 2023-10-02 ENCOUNTER — Ambulatory Visit
Admission: RE | Admit: 2023-10-02 | Discharge: 2023-10-02 | Disposition: A | Discharge status: Home / Self Care | Source: Ambulatory Visit | Attending: Internal Medicine | Admitting: Internal Medicine

## 2023-10-02 DIAGNOSIS — M8000XS Age-related osteoporosis with current pathological fracture, unspecified site, sequela: Secondary | ICD-10-CM | POA: Diagnosis not present

## 2023-10-02 DIAGNOSIS — Z780 Asymptomatic menopausal state: Secondary | ICD-10-CM | POA: Diagnosis not present

## 2023-10-02 DIAGNOSIS — M4854XA Collapsed vertebra, not elsewhere classified, thoracic region, initial encounter for fracture: Secondary | ICD-10-CM | POA: Diagnosis not present

## 2023-10-02 DIAGNOSIS — M810 Age-related osteoporosis without current pathological fracture: Secondary | ICD-10-CM | POA: Diagnosis not present

## 2023-10-03 ENCOUNTER — Ambulatory Visit: Attending: Nurse Practitioner | Admitting: Nurse Practitioner

## 2023-10-03 ENCOUNTER — Encounter: Payer: Self-pay | Admitting: Nurse Practitioner

## 2023-10-03 VITALS — BP 150/80 | HR 65 | Ht <= 58 in | Wt 132.1 lb

## 2023-10-03 DIAGNOSIS — E782 Mixed hyperlipidemia: Secondary | ICD-10-CM | POA: Insufficient documentation

## 2023-10-03 DIAGNOSIS — I10 Essential (primary) hypertension: Secondary | ICD-10-CM | POA: Diagnosis not present

## 2023-10-03 DIAGNOSIS — Z8673 Personal history of transient ischemic attack (TIA), and cerebral infarction without residual deficits: Secondary | ICD-10-CM | POA: Diagnosis not present

## 2023-10-03 DIAGNOSIS — I351 Nonrheumatic aortic (valve) insufficiency: Secondary | ICD-10-CM | POA: Insufficient documentation

## 2023-10-03 DIAGNOSIS — I2510 Atherosclerotic heart disease of native coronary artery without angina pectoris: Secondary | ICD-10-CM | POA: Diagnosis not present

## 2023-10-03 DIAGNOSIS — I5032 Chronic diastolic (congestive) heart failure: Secondary | ICD-10-CM | POA: Diagnosis not present

## 2023-10-03 DIAGNOSIS — I471 Supraventricular tachycardia, unspecified: Secondary | ICD-10-CM | POA: Diagnosis not present

## 2023-10-03 MED ORDER — METOPROLOL SUCCINATE ER 50 MG PO TB24
50.0000 mg | ORAL_TABLET | Freq: Every day | ORAL | 0 refills | Status: DC
Start: 2023-10-03 — End: 2023-10-18

## 2023-10-03 NOTE — Progress Notes (Signed)
 Office Visit    Patient Name: [name] Date of Encounter: 10/03/2023  Primary Care Provider:  Sherlene Shams, MD Primary Cardiologist:  Lorine Bears, MD  Chief Complaint    89 y.o. female w/a h/o NICM, chronic HFimpEF (EF 50-55% 08/2023), nonobs CAD, LBBB, diastolic dysfxn, moderate AI, HTN, HL, TIA, and nonobstructive carotid dzs, who presents for heart failure f/u.  Past Medical History  Subjective   Past Medical History:  Diagnosis Date   Aortic insufficiency    a. 04/2014 Echo: mild-mod AI; b. 08/2023 Echo: Mod AI (mean grad 9.0 mmHg).   Carotid artery disease (HCC)    a. 11/2018 Carotid U/S: 1-49% bilat ICA stenosis.   Coronary artery disease, non-occlusive    a. 08/2012 Ex MV: EF 51%, no ischemia; b. 10/2012 Cath (performed in setting of chest pain): LM nl, LAD 20-30p, D1/2 nl, LCX 20-25%p, OM1 small, RCA large - nl. RPDA/RPLV nl-->med rx. EF 55-60%.   Diastolic dysfunction    a. 08/2023 Echo: GrI DD.   Hyperlipidemia    Hypertension    Left bundle branch block    Nonischemic cardiomyopathy (HCC)    a. 04/2014 Echo: EF 45-50%, mild-mod AI, mild MR, mild-mod TR; b. 08/2023 Echo: EF 50-55%, no rwma, mild LVH, GrI DD, nl RV fxn, mild MR, mod AI.   PSVT (paroxysmal supraventricular tachycardia) (HCC)    a. 12/2018 Zio: 47 SVT runs (fastest 176, longests 17 beats).   TIA (transient ischemic attack) 11/2018   a. 12/2018 Zio: predominantly sinus rhyth, avg 66 (45-176), 47 SVT runs (longest 17 beats, fastest 176). Rare PVCs. No Afib.   Past Surgical History:  Procedure Laterality Date   ABDOMINAL HYSTERECTOMY     ankle     rods in both ankles   BREAST BIOPSY Bilateral YRS AGO   NEG   CARDIAC CATHETERIZATION     [facility]   left ankle  Octo 2012   s/p pinning , Krazinski   LEFT HEART CATHETERIZATION WITH CORONARY ANGIOGRAM N/A 10/31/2012   Procedure: LEFT HEART CATHETERIZATION WITH CORONARY ANGIOGRAM;  Surgeon: Robynn Pane, MD;  Location: [facility];  Service:  Cardiovascular;  Laterality: N/A;    Allergies  Allergies  Allergen Reactions   Contrast Media [Iodinated Contrast Media] Rash and Other (See Comments)    Swelling of the body.   Sulfa Antibiotics Hives, Swelling and Rash      History of Present Illness      89 y.o. y/o female w/a h/o NICM, chronic HFimpEF (EF 50-55% 08/2023), nonobs CAD, LBBB, diastolic dysfxn, moderate AI, HTN, HL, TIA, and nonobstructive carotid dzs.  In the setting of ongoing c/p despite prior normal stress test in 08/2012, she underwent cath in 10/2012, which showed mild, nonobs proximal LAD and LCX dzs w/ nl LV fxn.  She was medically managed.  An echo in 04/2014, showed mildly reduced EF of 45-50%.  In 11/2018, she was admitted w/ TIA symptoms.  Carotid U/S showed 1-49% bilat ICA dzs.  MRI of the brain showed no evidence of acute/subacute stroke.  Widespread intracranial atherosclerotic dzs w/ possible flow-limiting stenosis in the proximal R MCA and proximal L PCA was noted.  Subsequent outpt monitoring showed no Afib, though brief runs of SVT were noted and she has been managed w/ metoprolol therapy.  [name] was hospitalized in 08/2023 following recent COVID-19 diagnosis and dyspnea.  She was noted to have marked inferior ST elevation in the context of LBBB, which was more  pronounced than on prior ECGs.  Presentation was not felt to be consistent w/ STEMI.  Troponins were normal.  Echo showed an EF of 50-55% w/ grade 1 diastolic dysfxn, mild MR, and moderate AI.  She was seen by our team with recommendation for ongoing medical rx.   Since her hospitalization she has recovered completely.  She remains active at her retirement facility, using her walker to walk around the campus once daily.  This usually takes her about 30 or more minutes.  She takes breaks while walking only due to her left ankle discomfort following prior fracture.  She denies chest pain or dyspnea.  She does not experience palpitations, PND, orthopnea,  dizziness, syncope, edema, or early satiety.  She does check her blood pressure at home and notes that it typically runs greater than 140 mmHg. Objective  Home Medications    Current Outpatient Medications  Medication Sig Dispense Refill   amLODipine (NORVASC) 5 MG tablet TAKE ONE (1) TABLET BY MOUTH TWO TIMES PER DAY 180 tablet 2   Calcium Carbonate-Vitamin D 600-200 MG-UNIT TABS Take 1 tablet by mouth daily.     clopidogrel (PLAVIX) 75 MG tablet Take 1 tablet (75 mg total) by mouth daily. 90 tablet 3   fish oil-omega-3 fatty acids 1000 MG capsule Take 1 g by mouth daily.     latanoprost (XALATAN) 0.005 % ophthalmic solution Place 1 drop into both eyes at bedtime.     lidocaine (LIDODERM) 5 % Place 1 patch onto the skin daily. Remove & Discard patch within 12 hours or as directed by MD 30 patch 2   metoprolol succinate (TOPROL-XL) 25 MG 24 hr tablet Take 1.5 tablets (37.5 mg total) by mouth daily. 45 tablet 0   Multiple Vitamin (MULTIVITAMIN) tablet Take 1 tablet by mouth daily.       Multiple Vitamins-Minerals (EYE VITAMINS PO) Take 1 tablet by mouth daily.     pravastatin (PRAVACHOL) 20 MG tablet TAKE ONE TABLET (20 MG) BY MOUTH EVERY EVENING 90 tablet 1   telmisartan (MICARDIS) 80 MG tablet Take 1 tablet (80 mg total) by mouth daily. 90 tablet 1   guaiFENesin (ROBITUSSIN) 100 MG/5ML liquid Take 5 mLs by mouth every 4 (four) hours as needed for cough or to loosen phlegm. (Patient not taking: Reported on 10/03/2023) 120 mL 0   No current facility-administered medications for this visit.     Physical Exam    VS:  BP (!) 140/68 (BP Location: Left Arm, Patient Position: Sitting, Cuff Size: Normal)   Pulse 65   Ht 4\' 9"  (1.448 m)   Wt 132 lb 2 oz (59.9 kg)   SpO2 92%   BMI 28.59 kg/m  , BMI Body mass index is 28.59 kg/m.     Vitals:   10/03/23 0908 10/03/23 1034  BP: (!) 140/68 (!) 150/80  Pulse: 65   SpO2: 92%       GEN: Well nourished, well developed, in no acute  distress. HEENT: normal. Neck: Supple, no JVD, carotid bruits, or masses. Cardiac: RRR, 2/6 systolic murmur heard throughout.  No rubs or gallops.  No clubbing, cyanosis, edema.  Radials 2+/PT 2+ and equal bilaterally.  Respiratory:  Respirations regular and unlabored, clear to auscultation bilaterally. GI: Soft, nontender, nondistended, BS + x 4. MS: no deformity or atrophy. Skin: warm and dry, no rash. Neuro:  Strength and sensation are intact. Psych: Normal affect.  Accessory Clinical Findings    ECG personally reviewed by me today - EKG  Interpretation Date/Time:  Wednesday October 03 2023 09:17:36 EDT Ventricular Rate:  65 PR Interval:  178 QRS Duration:  144 QT Interval:  434 QTC Calculation: 451 R Axis:   -8  Text Interpretation: Normal sinus rhythm Left bundle branch block Confirmed by Laneta Pintos [phone]) on 10/03/2023 9:21:41 AM  - no acute changes.  Lab Results  Component Value Date   WBC 5.0 08/24/2023   HGB 14.5 08/24/2023   HCT 42.6 08/24/2023   MCV 94.0 08/24/2023   PLT 139 (L) 08/24/2023   Lab Results  Component Value Date   CREATININE 0.93 08/24/2023   BUN 25 (H) 08/24/2023   NA 132 (L) 08/24/2023   K 3.6 08/24/2023   CL 101 08/24/2023   CO2 23 08/24/2023   Lab Results  Component Value Date   ALT 13 07/04/2023   AST 20 07/04/2023   ALKPHOS 72 07/04/2023   BILITOT 0.7 07/04/2023   Lab Results  Component Value Date   CHOL 263 (H) 07/04/2023   HDL 53.00 07/04/2023   LDLCALC 174 (H) 07/04/2023   LDLDIRECT 160.0 07/04/2023   TRIG 181.0 (H) 07/04/2023   CHOLHDL 5 07/04/2023    Lab Results  Component Value Date   HGBA1C 5.7 07/26/2023   Lab Results  Component Value Date   TSH 2.97 07/26/2023       Assessment & Plan    1.  Chronic heart failure with improved ejection fraction/nonischemic cardiomyopathy: History of nonobstructive CAD on catheterization in May 2014 with a EF previously as low as 45 to 50% by echo November 2015.  Recently  hospitalized in the setting of dyspnea and COVID-19 infection with normal troponins.  Echocardiogram during hospitalization showed an EF of 50-55% without regional wall motion abnormalities and moderate AI.  We discussed her hospitalization, enzymatic, and echocardiographic findings.  She is euvolemic on examination today.  Her blood pressure is elevated on 2 recordings and she notes that it is frequently elevated at home.  We reviewed her diet, which is fairly high in processed foods and salt in the setting of living in a facility where she has no control over most of which she eats, though she does say that she frequently consumes soup.  She will try to look more closely at her diet to see where she can get rid of some salt.  We agreed to increase her Toprol-XL to 50 mg today and she otherwise remains on Micardis 80 mg and amlodipine 5 mg twice daily.  She has not required a diuretic.  2.  Primary hypertension: See #1.  Increasing Toprol-XL to 50 mg daily.  Continue current doses of Micardis and amlodipine.  3.  Hyperlipidemia: Intolerant to high potency statins and Zetia.  She remains on pravastatin and omega-3's.  Total cholesterol of 263 with an LDL of 174 in January 2025.  She notes that she is only taking her pravastatin about 3 times a week as she is nervous that she will begin experiencing myalgias if she were to take it more.  I did encourage her to try taking it daily though given advanced age, would not be more aggressive.  4.  Nonobstructive coronary artery disease: Patient with mild LAD and circumflex disease per catheterization in 2014.  She does not experience chest pain or dyspnea on exertion.  She remains on Plavix and statin therapy.  5.  PSVT: Previously noted on monitoring in 2020.  Quiescent on beta-blocker therapy.  6.  History of TIA: No recurrent symptoms.  She  remains on clopidogrel and statin therapy.  7.  Moderate aortic insufficiency: Recent echo with moderate AI, only  slightly worsened compared to echocardiogram in 2015.  8.  Disposition: Follow-up in 6 months or sooner if necessary.  Patient will contact us  with blood pressure recordings from home.  Laneta Pintos, NP 10/03/2023, 9:21 AM

## 2023-10-03 NOTE — Patient Instructions (Signed)
 Medication Instructions:  INCREASE the Metoprolol (Toprol) to 50 mg once daily  *If you need a refill on your cardiac medications before your next appointment, please call your pharmacy*  Lab Work: None ordered If you have labs (blood work) drawn today and your tests are completely normal, you will receive your results only by: MyChart Message (if you have MyChart) OR A paper copy in the mail If you have any lab test that is abnormal or we need to change your treatment, we will call you to review the results.  Testing/Procedures: None ordered  Follow-Up: At [facility], you and your health needs are our priority.  As part of our continuing mission to provide you with exceptional heart care, our providers are all part of one team.  This team includes your primary Cardiologist (physician) and Advanced Practice Providers or APPs (Physician Assistants and Nurse Practitioners) who all work together to provide you with the care you need, when you need it.  Your next appointment:   6 month(s)  Provider:   Dr. Alvenia Aus  We recommend signing up for the patient portal called "MyChart".  Sign up information is provided on this After Visit Summary.  MyChart is used to connect with patients for Virtual Visits (Telemedicine).  Patients are able to view lab/test results, encounter notes, upcoming appointments, etc.  Non-urgent messages can be sent to your provider as well.   To learn more about what you can do with MyChart, go to [email or web address].

## 2023-10-08 ENCOUNTER — Encounter: Payer: Self-pay | Admitting: Internal Medicine

## 2023-10-08 ENCOUNTER — Ambulatory Visit (INDEPENDENT_AMBULATORY_CARE_PROVIDER_SITE_OTHER): Admitting: Internal Medicine

## 2023-10-08 VITALS — BP 136/56 | HR 64 | Ht <= 58 in | Wt 132.2 lb

## 2023-10-08 DIAGNOSIS — M8000XD Age-related osteoporosis with current pathological fracture, unspecified site, subsequent encounter for fracture with routine healing: Secondary | ICD-10-CM | POA: Diagnosis not present

## 2023-10-08 MED ORDER — TELMISARTAN 80 MG PO TABS: 80.0000 mg | ORAL_TABLET | Freq: Every day | ORAL | 1 refills | Status: DC

## 2023-10-08 MED ORDER — AMLODIPINE BESYLATE 5 MG PO TABS: ORAL_TABLET | ORAL | 2 refills | Status: DC

## 2023-10-08 NOTE — Patient Instructions (Signed)
 We will check with your insurance about coverage for Prolia or Evenity  and let you know what the cost will be to you  Continue walking,  calcium and vitamin D  supplementation

## 2023-10-08 NOTE — Progress Notes (Unsigned)
 Subjective:  Patient ID: [name], female    DOB: 13-Mar-1923  Age: 89 y.o. MRN: [id number]  CC: There were no encounter diagnoses.   HPI [name] presents for  Chief Complaint  Patient presents with   Medical Management of Chronic Issues    Discuss bone density results   89 yr old female wth prior vertebral fractures here to discuss treatment options for osteoporosis (she has historically declined treament since 2018)  iDEXA reviewed    Outpatient Medications Prior to Visit  Medication Sig Dispense Refill   amLODipine  (NORVASC ) 5 MG tablet TAKE ONE (1) TABLET BY MOUTH TWO TIMES PER DAY 180 tablet 2   Calcium Carbonate-Vitamin D  600-200 MG-UNIT TABS Take 1 tablet by mouth daily.     clopidogrel  (PLAVIX ) 75 MG tablet Take 1 tablet (75 mg total) by mouth daily. 90 tablet 3   fish oil-omega-3 fatty acids 1000 MG capsule Take 1 g by mouth daily.     latanoprost (XALATAN) 0.005 % ophthalmic solution Place 1 drop into both eyes at bedtime.     lidocaine  (LIDODERM ) 5 % Place 1 patch onto the skin daily. Remove & Discard patch within 12 hours or as directed by MD 30 patch 2   metoprolol  succinate (TOPROL -XL) 50 MG 24 hr tablet Take 1 tablet (50 mg total) by mouth daily. 90 tablet 0   Multiple Vitamin (MULTIVITAMIN) tablet Take 1 tablet by mouth daily.       Multiple Vitamins-Minerals (EYE VITAMINS PO) Take 1 tablet by mouth daily.     pravastatin  (PRAVACHOL ) 20 MG tablet TAKE ONE TABLET (20 MG) BY MOUTH EVERY EVENING 90 tablet 1   telmisartan  (MICARDIS ) 80 MG tablet Take 1 tablet (80 mg total) by mouth daily. 90 tablet 1   guaiFENesin  (ROBITUSSIN) 100 MG/5ML liquid Take 5 mLs by mouth every 4 (four) hours as needed for cough or to loosen phlegm. (Patient not taking: Reported on 10/08/2023) 120 mL 0   No facility-administered medications prior to visit.    Review of Systems;  Patient denies headache, fevers, malaise, unintentional weight loss, skin rash, eye pain, sinus  congestion and sinus pain, sore throat, dysphagia,  hemoptysis , cough, dyspnea, wheezing, chest pain, palpitations, orthopnea, edema, abdominal pain, nausea, melena, diarrhea, constipation, flank pain, dysuria, hematuria, urinary  Frequency, nocturia, numbness, tingling, seizures,  Focal weakness, Loss of consciousness,  Tremor, insomnia, depression, anxiety, and suicidal ideation.      Objective:  BP (!) 136/56   Pulse 64   Ht 4\' 9"  (1.448 m)   Wt 132 lb 3.2 oz (60 kg)   SpO2 96%   BMI 28.61 kg/m   BP Readings from Last 3 Encounters:  10/08/23 (!) 136/56  10/03/23 (!) 150/80  08/24/23 (!) 147/72    Wt Readings from Last 3 Encounters:  10/08/23 132 lb 3.2 oz (60 kg)  10/03/23 132 lb 2 oz (59.9 kg)  08/23/23 123 lb (55.8 kg)    Physical Exam  Lab Results  Component Value Date   HGBA1C 5.7 07/26/2023   HGBA1C 5.9 01/02/2023   HGBA1C 5.9 04/19/2022    Lab Results  Component Value Date   CREATININE 0.93 08/24/2023   CREATININE 1.12 (H) 08/23/2023   CREATININE 1.03 07/04/2023    Lab Results  Component Value Date   WBC 5.0 08/24/2023   HGB 14.5 08/24/2023   HCT 42.6 08/24/2023   PLT 139 (L) 08/24/2023   GLUCOSE 102 (H) 08/24/2023   CHOL 263 (H) 07/04/2023  TRIG 181.0 (H) 07/04/2023   HDL 53.00 07/04/2023   LDLDIRECT 160.0 07/04/2023   LDLCALC 174 (H) 07/04/2023   ALT 13 07/04/2023   AST 20 07/04/2023   NA 132 (L) 08/24/2023   K 3.6 08/24/2023   CL 101 08/24/2023   CREATININE 0.93 08/24/2023   BUN 25 (H) 08/24/2023   CO2 23 08/24/2023   TSH 2.97 07/26/2023   HGBA1C 5.7 07/26/2023   MICROALBUR 0.8 07/04/2023    DG Bone Density Result Date: 10/03/2023 EXAM: DUAL X-RAY ABSORPTIOMETRY (DXA) FOR BONE MINERAL DENSITY 10/02/2023 9:58 am CLINICAL DATA:  89 year old Female Postmenopausal. Multiple compression fractures of thoracic vertebra History of fragility fracture. History of vertebral fracture. TECHNIQUE: An axial (e.g., hips, spine) and/or appendicular  (e.g., radius) exam was performed, as appropriate, using GE Secretary/administrator at [facility]. Images are obtained for bone mineral density measurement and are not obtained for diagnostic purposes. [id number] Exclusions: L1 due to out of range COMPARISON:  None. FINDINGS: Scan quality: Good. LUMBAR SPINE (L2-L4): BMD (in g/cm2): 0.775 T-score: -3.5 Z-score: -1.4 LEFT FEMORAL NECK: BMD (in g/cm2): 0.621 T-score: -3.0 Z-score: 0.0 LEFT TOTAL HIP: BMD (in g/cm2): 0.692 T-score: -2.5 Z-score: 0.5 RIGHT FEMORAL NECK: BMD (in g/cm2): 0.633 T-score: -2.9 Z-score: 0.1 RIGHT TOTAL HIP: BMD (in g/cm2): 0.692 T-score: -2.5 Z-score: 0.5 RIGHT FOREARM (RADIUS 33%): BMD (in g/cm2): 0.518 T-score: -4.1 Z-score: 0.3 FRAX 10-YEAR PROBABILITY OF FRACTURE: FRAX not reported as the lowest BMD is not in the osteopenia range. IMPRESSION: Osteoporosis based on BMD. Fracture risk is increased. Increased risk is based on low BMD and history of vertebral fracture. RECOMMENDATIONS: 1. All patients should optimize calcium and vitamin D  intake. 2. Consider FDA-approved medical therapies in postmenopausal women and men aged 88 years and older, based on the following: - A hip or vertebral (clinical or morphometric) fracture - T-score less than or equal to -2.5 and secondary causes have been excluded. - Low bone mass (T-score between -1.0 and -2.5) and a 10-year probability of a hip fracture greater than or equal to 3% or a 10-year probability of a major osteoporosis-related fracture greater than or equal to 20% based on the [place] -adapted WHO algorithm. - Clinician judgment and/or patient preferences may indicate treatment for people with 10-year fracture probabilities above or below these levels 3. Patients with diagnosis of osteoporosis or at high risk for fracture should have regular bone mineral density tests. For patients eligible for Medicare, routine testing is allowed once every 2 years. The testing frequency  can be increased to one year for patients who have rapidly progressing disease, those who are receiving or discontinuing medical therapy to restore bone mass, or have additional risk factors. Electronically Signed   By: Dina  Arceo M.D.   On: 10/03/2023 07:50    Assessment & Plan:  .There are no diagnoses linked to this encounter.   I spent 34 minutes on the day of this face to face encounter reviewing patient's  most recent visit with cardiology,  nephrology,  and neurology,  prior relevant surgical and non surgical procedures, recent  labs and imaging studies, counseling on weight management,  reviewing the assessment and plan with patient, and post visit ordering and reviewing of  diagnostics and therapeutics with patient  .   Follow-up: No follow-ups on file.   Thersia Flax, MD

## 2023-10-09 ENCOUNTER — Telehealth: Payer: Self-pay | Admitting: *Deleted

## 2023-10-09 ENCOUNTER — Telehealth: Payer: Self-pay

## 2023-10-09 NOTE — Telephone Encounter (Signed)
 Evenity VOB initiated via [email or web address]  Last Evenity inj:  Next Evenity inj DUE:  [place]

## 2023-10-09 NOTE — Telephone Encounter (Signed)
 Sent to PA team.

## 2023-10-09 NOTE — Assessment & Plan Note (Signed)
 T12 fracture was diagnosed in December,  but films noted prior verbral fractures at multiple levels. DEXA  was repeated last month and reviewed today. .  She is willling to consider  therapy with  Prolia or Evenity pending insurance authorization.  Calcium and vitamin d  needs reviewed.

## 2023-10-09 NOTE — Telephone Encounter (Signed)
-----   Message from Thersia Flax sent at 10/09/2023  6:19 AM EDT ----- Can you start authorization process for Evenity? If not approved,  then Prolia?

## 2023-10-09 NOTE — Telephone Encounter (Signed)
 Benefit verification started in new encounter.

## 2023-10-12 NOTE — Telephone Encounter (Signed)
 [name]

## 2023-10-12 NOTE — Telephone Encounter (Signed)
 Pt ready for scheduling for EVENITY on or after : 10/12/23  Option# 1 Buy/Bill (Office supplied medication)  Out-of-pocket cost due at time of  office visit: $0  Number of injection/visits approved: ---  Primary: MEDICARE Evenity co-insurance: 0% Admin fee co-insurance: 0%  Secondary: AARP-MEDSUP Evenity co-insurance:  Admin fee co-insurance:   Medical Benefit Details: Date Benefits were checked: 10/12/23 Deductible: $257 Met of $257 Required/ Coinsurance: 0%/ Admin Fee: 0%  Prior Auth: N/A PA# Expiration Date:   # of doses approved: ------------------------------------------------------------------------- Option# 2- Med Obtained from pharmacy  Pharmacy benefit: Copay $--- (Paid to pharmacy) Admin Fee: --- (Pay at clinic)  Prior Auth: N/A PA# Expiration Date:   # of doses approved:  If patient wants fill through the pharmacy benefit please send prescription to:  --- , and include estimated need by date in rx notes. Pharmacy will ship medication directly to the office.  Patient NOT eligible for Evenity Copay Card. Copay Card can make patient's cost as little as $25. Link to apply: [email or web address]://www.amgensupportplus.[email or web address]/copay   This summary of benefits is an estimation of the patient's out-of-pocket cost. Exact cost may very based on individual plan coverage.

## 2023-10-15 ENCOUNTER — Other Ambulatory Visit: Payer: Self-pay | Admitting: Cardiovascular Disease

## 2023-10-18 ENCOUNTER — Other Ambulatory Visit: Payer: Self-pay | Admitting: Nurse Practitioner

## 2023-11-15 MED ORDER — ROMOSOZUMAB-AQQG 105 MG/1.17ML ~~LOC~~ SOSY
210.0000 mg | PREFILLED_SYRINGE | Freq: Once | SUBCUTANEOUS | Status: AC
  Administered 2023-11-19: 210 mg via SUBCUTANEOUS

## 2023-11-15 NOTE — Addendum Note (Signed)
 Addended by: Lindle Rhea on: 11/15/2023 03:01 PM   Modules accepted: Orders

## 2023-11-15 NOTE — Telephone Encounter (Signed)
 Spoke with patient about starting Evenity and potential side effects. PT also informed that she will need to stay in office for 15-20 minutes after 1st injection.  Patient states that she will call back to schedule once she talks with her daughter who will be bringing her,

## 2023-11-16 ENCOUNTER — Ambulatory Visit

## 2023-11-19 ENCOUNTER — Ambulatory Visit

## 2023-11-19 VITALS — BP 177/71 | HR 65 | Temp 98.1°F

## 2023-11-19 DIAGNOSIS — M8000XD Age-related osteoporosis with current pathological fracture, unspecified site, subsequent encounter for fracture with routine healing: Secondary | ICD-10-CM

## 2023-11-19 DIAGNOSIS — M810 Age-related osteoporosis without current pathological fracture: Secondary | ICD-10-CM

## 2023-11-19 MED ORDER — ROMOSOZUMAB-AQQG 105 MG/1.17ML ~~LOC~~ SOSY
210.0000 mg | PREFILLED_SYRINGE | SUBCUTANEOUS | Status: AC
  Administered 2023-12-19 – 2024-02-25 (×2): 210 mg via SUBCUTANEOUS

## 2023-11-19 NOTE — Progress Notes (Signed)
 Pt presented for 1st EVENITY   injection. Pt was identfied through two identifiers.    Vitals were checked priior to the injection as well as after.    1st set of vitals:   Temp: 98 BP: 188/69 O2: 94% P: 68   Blood pressure was elevated and pt denied any symptoms., Injections were given upon provider approval.   Pt tolerated injections well in both arms and advised to wait 15 minutes.   Pt wanted her BP checked again due to it being high.:    BP: 165/64 P: 65 O2: 95%   Pt was checked out periodically throughout the 15 minutes and stated she was fine each time.    After the 15 minutes another set of vitals were taken. :   Temp:98.1 BP: 177/71 P: 65  O2: 94%   Pt verbalized she was okay and I asked pt if she had any symptoms or concerns and she stated she was okay.   Due to BP coming down. Pt was discharged and note sent to PCP.   Pt was advised if she develops any rashes or pain at injection site to please contact the office. If she develops any symptoms seek medical attention.   Pt verbalized understanding.

## 2023-11-20 DIAGNOSIS — H2513 Age-related nuclear cataract, bilateral: Secondary | ICD-10-CM | POA: Diagnosis not present

## 2023-11-20 DIAGNOSIS — H25013 Cortical age-related cataract, bilateral: Secondary | ICD-10-CM | POA: Diagnosis not present

## 2023-11-20 DIAGNOSIS — H401110 Primary open-angle glaucoma, right eye, stage unspecified: Secondary | ICD-10-CM | POA: Diagnosis not present

## 2023-12-03 ENCOUNTER — Telehealth: Payer: Self-pay

## 2023-12-03 ENCOUNTER — Other Ambulatory Visit: Payer: Self-pay | Admitting: Internal Medicine

## 2023-12-03 NOTE — Telephone Encounter (Signed)
 Copied from CRM [id number]. Topic: General - Other >> Dec 03, 2023  1:57 PM Antonieta Kitten wrote: Reason for CRM: Daughter Abe Abed calling to ask question about patient driving. Would like a call back

## 2023-12-04 ENCOUNTER — Telehealth: Payer: Self-pay | Admitting: Internal Medicine

## 2023-12-04 NOTE — Telephone Encounter (Signed)
 Copied from CRM [id number]. Topic: General - Other >> Dec 04, 2023  8:48 AM [name] wrote: Reason for CRM: Patients daughter called in wanting to let Dr. Madelon Scheuermann know that [name] has decided to go ahead and give up her license.

## 2023-12-04 NOTE — Telephone Encounter (Signed)
 FYI

## 2023-12-05 NOTE — Telephone Encounter (Signed)
 Daughter is aware

## 2023-12-19 ENCOUNTER — Ambulatory Visit

## 2023-12-19 DIAGNOSIS — M810 Age-related osteoporosis without current pathological fracture: Secondary | ICD-10-CM | POA: Diagnosis not present

## 2023-12-19 MED ORDER — ROMOSOZUMAB-AQQG 105 MG/1.17ML ~~LOC~~ SOSY: 210.0000 mg | PREFILLED_SYRINGE | Freq: Once | SUBCUTANEOUS | Status: AC

## 2023-12-19 NOTE — Progress Notes (Signed)
 Pt presented in office for her monthly Evenity  shot in her left and right arm. Pt showed no signs of distress during or after injection

## 2024-01-08 ENCOUNTER — Other Ambulatory Visit: Payer: Self-pay | Admitting: Nurse Practitioner

## 2024-01-21 ENCOUNTER — Other Ambulatory Visit: Payer: Self-pay

## 2024-01-21 MED ORDER — PRAVASTATIN SODIUM 20 MG PO TABS
ORAL_TABLET | ORAL | 1 refills | Status: AC
Start: 2024-01-21 — End: ?

## 2024-01-21 MED ORDER — TELMISARTAN 80 MG PO TABS: 80.0000 mg | ORAL_TABLET | Freq: Every day | ORAL | 1 refills | Status: DC

## 2024-01-23 ENCOUNTER — Telehealth: Payer: Self-pay

## 2024-01-23 ENCOUNTER — Ambulatory Visit

## 2024-01-23 DIAGNOSIS — M810 Age-related osteoporosis without current pathological fracture: Secondary | ICD-10-CM

## 2024-01-23 MED ORDER — ROMOSOZUMAB-AQQG 105 MG/1.17ML ~~LOC~~ SOSY
210.0000 mg | PREFILLED_SYRINGE | Freq: Once | SUBCUTANEOUS | Status: AC
  Administered 2024-01-23: 210 mg via SUBCUTANEOUS

## 2024-01-23 NOTE — Telephone Encounter (Signed)
 SABRA

## 2024-01-23 NOTE — Telephone Encounter (Signed)
 Pt ready for scheduling for EVENITY  on or after : 02/23/24  Option# 1 Buy/Bill (Office supplied medication)  Out-of-pocket cost due at time of  office visit: $0  Number of injection/visits approved: ---  Primary: MEDICARE Evenity  co-insurance: 0% Admin fee co-insurance: 0%  Secondary: AARP-MEDSUP Evenity  co-insurance:  Admin fee co-insurance:   Medical Benefit Details: Date Benefits were checked: 01/23/24 Deductible: $257 Met of $257 Required/ Coinsurance: 0%/ Admin Fee: 0%  Prior Auth: N/A PA# Expiration Date:   # of doses approved: ------------------------------------------------------------------------- Option# 2- Med Obtained from pharmacy  Pharmacy benefit: Copay $--- (Paid to pharmacy) Admin Fee: --- (Pay at clinic)  Prior Auth: --- PA# Expiration Date:   # of doses approved:  If patient wants fill through the pharmacy benefit please send prescription to: ---, and include estimated need by date in rx notes. Pharmacy will ship medication directly to the office.  Patient NOT eligible for Evenity  Copay Card. Copay Card can make patient's cost as little as $25. Link to apply: https://www.amgensupportplus.com/copay   This summary of benefits is an estimation of the patient's out-of-pocket cost. Exact cost may very based on individual plan coverage.

## 2024-01-23 NOTE — Progress Notes (Signed)
 Patient was administered an Evenity  injection into her left and right arm. Patient tolerated the Evenity  injection well.

## 2024-01-23 NOTE — Telephone Encounter (Signed)
 Evenity  VOB initiated via [email or web address]  Last Evenity  inj: 01/23/24 Next Evenity  inj DUE: 02/23/24

## 2024-02-25 ENCOUNTER — Ambulatory Visit (INDEPENDENT_AMBULATORY_CARE_PROVIDER_SITE_OTHER)

## 2024-02-25 DIAGNOSIS — M810 Age-related osteoporosis without current pathological fracture: Secondary | ICD-10-CM | POA: Diagnosis not present

## 2024-02-25 MED ORDER — ROMOSOZUMAB-AQQG 105 MG/1.17ML ~~LOC~~ SOSY
210.0000 mg | PREFILLED_SYRINGE | SUBCUTANEOUS | Status: AC
  Administered 2024-03-26: 210 mg via SUBCUTANEOUS

## 2024-02-25 MED ORDER — ROMOSOZUMAB-AQQG 105 MG/1.17ML ~~LOC~~ SOSY: 210.0000 mg | PREFILLED_SYRINGE | SUBCUTANEOUS | Status: DC

## 2024-02-25 NOTE — Progress Notes (Signed)
 Pt received EVENITY  injection in SUB Q back of right and left arm  Pt tolerated it well with no complaints or concerns.

## 2024-03-19 DIAGNOSIS — I10 Essential (primary) hypertension: Secondary | ICD-10-CM | POA: Diagnosis not present

## 2024-03-19 DIAGNOSIS — M19172 Post-traumatic osteoarthritis, left ankle and foot: Secondary | ICD-10-CM | POA: Diagnosis not present

## 2024-03-26 ENCOUNTER — Ambulatory Visit

## 2024-03-26 DIAGNOSIS — M810 Age-related osteoporosis without current pathological fracture: Secondary | ICD-10-CM

## 2024-03-26 MED ORDER — ROMOSOZUMAB-AQQG 105 MG/1.17ML ~~LOC~~ SOSY
210.0000 mg | PREFILLED_SYRINGE | SUBCUTANEOUS | Status: AC
  Administered 2024-05-30: 210 mg via SUBCUTANEOUS

## 2024-03-26 MED ORDER — ROMOSOZUMAB-AQQG 105 MG/1.17ML ~~LOC~~ SOSY: 210.0000 mg | PREFILLED_SYRINGE | Freq: Once | SUBCUTANEOUS | Status: DC

## 2024-03-26 NOTE — Progress Notes (Signed)
 Patient was administered a Evenity  injection into her left & right arms subcutaneously. Patient tolerated the Eventiy injection well.

## 2024-03-28 DIAGNOSIS — Z23 Encounter for immunization: Secondary | ICD-10-CM | POA: Diagnosis not present

## 2024-04-07 DIAGNOSIS — H6123 Impacted cerumen, bilateral: Secondary | ICD-10-CM | POA: Diagnosis not present

## 2024-04-07 DIAGNOSIS — H93293 Other abnormal auditory perceptions, bilateral: Secondary | ICD-10-CM | POA: Diagnosis not present

## 2024-04-11 ENCOUNTER — Ambulatory Visit: Attending: Cardiovascular Disease | Admitting: Cardiovascular Disease

## 2024-04-11 ENCOUNTER — Encounter: Payer: Self-pay | Admitting: Cardiovascular Disease

## 2024-04-11 VITALS — BP 122/76 | HR 61 | Ht 61.5 in | Wt 138.1 lb

## 2024-04-11 DIAGNOSIS — I5022 Chronic systolic (congestive) heart failure: Secondary | ICD-10-CM | POA: Diagnosis not present

## 2024-04-11 DIAGNOSIS — E785 Hyperlipidemia, unspecified: Secondary | ICD-10-CM | POA: Insufficient documentation

## 2024-04-11 DIAGNOSIS — I10 Essential (primary) hypertension: Secondary | ICD-10-CM | POA: Diagnosis not present

## 2024-04-11 DIAGNOSIS — Z8673 Personal history of transient ischemic attack (TIA), and cerebral infarction without residual deficits: Secondary | ICD-10-CM | POA: Diagnosis not present

## 2024-04-11 NOTE — Patient Instructions (Signed)

## 2024-04-11 NOTE — Progress Notes (Signed)
 Cardiology Office Note   Date:  04/11/2024   ID:  [name], DOB 04/10/1923, MRN [id number]  PCP:  [name], MD  Cardiologist:   [name], MD   Chief Complaint  Patient presents with   Follow-up    6 month f/u no complaints today. Meds reviewed verbally with pt.      History of Present Illness: [name] is a 89 y.o. female who presents for a follow up visit regarding nonischemic cardiomyopathy. She had cardiac catheterization in May 2014 which showed mild nonobstructive coronary artery disease involving the LAD and left circumflex. Ejection fraction was normal.  She does have left bundle branch block. Echocardiogram in November 2015 showed an ejection fraction of 45-50%, mild-to-moderate aortic regurgitation, mild mitral regurgitation and mild-to-moderate tricuspid regurgitation. Pulmonary pressure was normal.  She had TIA symptoms in June 2020.  Plavix  was added to aspirin .  Carotid Doppler showed mild nonobstructive carotid disease.  MRI of the brain showed no evidence of acute or subacute infarct.  She was noted to have widespread intracranial atherosclerotic disease with possible flow-limiting stenosis in the proximal right MCA and proximal left PCA.  She had outpatient monitor which showed no evidence of atrial fibrillation although she was found to have frequent short episodes of SVT.    She was briefly hospitalized in March with COVID infection but recovered fine.  She moved into [facility] [facility] facility and she stopped driving 2 months ago.  She has been doing well overall with no chest pain or worsening dyspnea.  Echocardiogram in March showed an EF of 50 to 55% with mild mitral regurgitation.  Past Medical History:  Diagnosis Date   Aortic insufficiency    a. 04/2014 Echo: mild-mod AI; b. 08/2023 Echo: Mod AI (mean grad 9.0 mmHg).   Carotid artery disease    a. 11/2018 Carotid U/S: 1-49% bilat ICA stenosis.   Coronary artery disease,  non-occlusive    a. 08/2012 Ex MV: EF 51%, no ischemia; b. 10/2012 Cath (performed in setting of chest pain): LM nl, LAD 20-30p, D1/2 nl, LCX 20-25%p, OM1 small, RCA large - nl. RPDA/RPLV nl-->med rx. EF 55-60%.   Diastolic dysfunction    a. 08/2023 Echo: GrI DD.   Hyperlipidemia    Hypertension    Left bundle branch block    Nonischemic cardiomyopathy (HCC)    a. 04/2014 Echo: EF 45-50%, mild-mod AI, mild MR, mild-mod TR; b. 08/2023 Echo: EF 50-55%, no rwma, mild LVH, GrI DD, nl RV fxn, mild MR, mod AI.   PSVT (paroxysmal supraventricular tachycardia)    a. 12/2018 Zio: 47 SVT runs (fastest 176, longests 17 beats).   TIA (transient ischemic attack) 11/2018   a. 12/2018 Zio: predominantly sinus rhyth, avg 66 (45-176), 47 SVT runs (longest 17 beats, fastest 176). Rare PVCs. No Afib.    Past Surgical History:  Procedure Laterality Date   ABDOMINAL HYSTERECTOMY     ankle     rods in both ankles   BREAST BIOPSY Bilateral YRS AGO   NEG   CARDIAC CATHETERIZATION     [facility]   left ankle  Octo 2012   s/p pinning , Krazinski   LEFT HEART CATHETERIZATION WITH CORONARY ANGIOGRAM N/A 10/31/2012   Procedure: LEFT HEART CATHETERIZATION WITH CORONARY ANGIOGRAM;  Surgeon: Rober LOISE Chroman, MD;  Location: [facility] CATH LAB;  Service: Cardiovascular;  Laterality: N/A;     Current Outpatient Medications  Medication Sig Dispense Refill   amLODipine  (NORVASC ) 5 MG  tablet TAKE ONE (1) TABLET BY MOUTH TWO TIMES PER DAY 180 tablet 1   Calcium Carbonate-Vitamin D  600-200 MG-UNIT TABS Take 1 tablet by mouth daily.     clopidogrel  (PLAVIX ) 75 MG tablet Take 1 tablet (75 mg total) by mouth daily. 90 tablet 3   fish oil-omega-3 fatty acids 1000 MG capsule Take 1 g by mouth daily.     latanoprost (XALATAN) 0.005 % ophthalmic solution Place 1 drop into both eyes at bedtime.     lidocaine  (LIDODERM ) 5 % Place 1 patch onto the skin daily. Remove & Discard patch within 12 hours or as directed by MD 30 patch 2   metoprolol   succinate (TOPROL -XL) 50 MG 24 hr tablet TAKE ONE TABLET BY MOUTH ONCE DAILY 90 tablet 3   Multiple Vitamin (MULTIVITAMIN) tablet Take 1 tablet by mouth daily.       Multiple Vitamins-Minerals (EYE VITAMINS PO) Take 1 tablet by mouth daily.     pravastatin  (PRAVACHOL ) 20 MG tablet TAKE ONE TABLET (20 MG) BY MOUTH EVERY EVENING 90 tablet 1   telmisartan  (MICARDIS ) 80 MG tablet Take 1 tablet (80 mg total) by mouth daily. 90 tablet 1   Current Facility-Administered Medications  Medication Dose Route Frequency Provider Last Rate Last Admin   Romosozumab -aqqg (EVENITY ) 105 MG/1. injection 210 mg  210 mg Subcutaneous Q30 days [name], MD   210 mg at 02/25/24 1649   Romosozumab -aqqg (EVENITY ) 105 MG/1. injection 210 mg  210 mg Subcutaneous Once Tullo, Teresa L, MD       [START ON 04/26/2024] Romosozumab -aqqg (EVENITY ) 105 MG/1. injection 210 mg  210 mg Subcutaneous Q30 days [name], MD        Allergies:   Contrast media [iodinated contrast media] and Sulfa antibiotics    Social History:  The patient  reports that she has never smoked. She has never used smokeless tobacco. She reports that she does not drink alcohol and does not use drugs.   Family History:  The patient's family history includes Cancer in her father; Heart attack in her brother and mother; Hypertension in her father and mother; Stroke in her sister.    ROS:  Please see the history of present illness.   Otherwise, review of systems are positive for none.   All other systems are reviewed and negative.    PHYSICAL EXAM: VS:  BP 122/76 (BP Location: Left Arm, Patient Position: Sitting, Cuff Size: Normal)   Pulse 61   Ht 5' 1.5 (1.562 m)   Wt 138 lb 2 oz (62.7 kg)   SpO2 95%   BMI 25.68 kg/m  , BMI Body mass index is 25.68 kg/m. GEN: Well nourished, well developed, in no acute distress  HEENT: normal  Neck: no JVD, carotid bruits, or masses Cardiac: RRR; no rubs, or gallops,no edema . 1/ 6 systolic  murmur in the aortic area Respiratory:  clear to auscultation bilaterally, normal work of breathing GI: soft, nontender, nondistended, + BS MS: no deformity or atrophy  Skin: warm and dry, no rash Neuro:  Strength and sensation are intact Psych: euthymic mood, full affect   EKG:  EKG is ordered today. The ekg ordered today demonstrates : Normal sinus rhythm Left bundle branch block When compared with ECG of 03-Oct-2023 09:17, No significant change was found     Recent Labs: 07/04/2023: ALT 13 07/26/2023: TSH 2.97 08/24/2023: BUN 25; Creatinine, Ser 0.93; Hemoglobin 14.5; Platelets 139; Potassium 3.6; Sodium 132    Lipid Panel  Component Value Date/Time   CHOL 263 (H) 07/04/2023 0930   CHOL 226 (H) 09/16/2012 0542   TRIG 181.0 (H) 07/04/2023 0930   TRIG 109 09/16/2012 0542   HDL 53.00 07/04/2023 0930   HDL 60 09/16/2012 0542   CHOLHDL 5 07/04/2023 0930   VLDL 36.2 07/04/2023 0930   VLDL 22 09/16/2012 0542   LDLCALC 174 (H) 07/04/2023 0930   LDLCALC 144 (H) 09/16/2012 0542   LDLDIRECT 160.0 07/04/2023 0930      Wt Readings from Last 3 Encounters:  04/11/24 138 lb 2 oz (62.7 kg)  10/08/23 132 lb 3.2 oz (60 kg)  10/03/23 132 lb 2 oz (59.9 kg)        ASSESSMENT AND PLAN:  1.  Chronic systolic heart failure: Due to nonischemic cardiomyopathy.  Most recent ejection fraction improved to 50 to 55%.  She continues to be in [place]  heart association class II with no evidence of volume overload.   Continue current medications.  2. Essential hypertension: Blood pressure is well control on current medications.  She is on metoprolol , amlodipine  and telmisartan .  3.  Hyperlipidemia: Intolerance to statins and Zetia.  She is tolerating pravastatin  20 mg daily.  4.  Previous TIA: On clopidogrel  without aspirin .   Disposition:   FU with me in 6 months  Signed,  [name], MD  04/11/2024 9:13 AM    [facility]

## 2024-04-15 ENCOUNTER — Telehealth: Payer: Self-pay | Admitting: Internal Medicine

## 2024-04-15 NOTE — Telephone Encounter (Signed)
 Lm to call office to reschedule her AWV. This appointment will have to go out to 2026, no available 2025 visits available at time to call.     Copied from CRM [id number]. Topic: Appointments - Scheduling Inquiry for Clinic >> Apr 15, 2024 10:04 AM [name] wrote: Reason for CRM: Patient called to reschedule AWV which was originally scheduled for 04/18/24. DT showing next available in person visit for AWV at [facility] location 12/2026. Please contact patient to reschedule. Patient will be away from home 12:00 pm- 12:30 pm if unable to reach patient please leave a detailed message.   CB#[phone]

## 2024-04-21 ENCOUNTER — Other Ambulatory Visit: Payer: Self-pay | Admitting: Internal Medicine

## 2024-04-25 DIAGNOSIS — Z23 Encounter for immunization: Secondary | ICD-10-CM | POA: Diagnosis not present

## 2024-04-29 ENCOUNTER — Ambulatory Visit

## 2024-04-29 DIAGNOSIS — M8000XS Age-related osteoporosis with current pathological fracture, unspecified site, sequela: Secondary | ICD-10-CM

## 2024-04-29 MED ORDER — ROMOSOZUMAB-AQQG 105 MG/1.17ML ~~LOC~~ SOSY
210.0000 mg | PREFILLED_SYRINGE | Freq: Once | SUBCUTANEOUS | Status: AC
  Administered 2024-04-29: 210 mg via SUBCUTANEOUS

## 2024-04-29 NOTE — Progress Notes (Signed)
 Pt received Evenity  injection subcutaneously  in right/Left arms. Pt tolerated it well with no complaints or concerns.

## 2024-05-19 ENCOUNTER — Emergency Department
Admission: EM | Admit: 2024-05-19 | Discharge: 2024-05-19 | Disposition: A | Discharge status: Home / Self Care | Attending: Emergency Medicine | Admitting: Emergency Medicine

## 2024-05-19 ENCOUNTER — Emergency Department

## 2024-05-19 ENCOUNTER — Telehealth: Payer: Self-pay | Admitting: Cardiovascular Disease

## 2024-05-19 DIAGNOSIS — I700 Atherosclerosis of aorta: Secondary | ICD-10-CM | POA: Diagnosis not present

## 2024-05-19 DIAGNOSIS — I5033 Acute on chronic diastolic (congestive) heart failure: Secondary | ICD-10-CM | POA: Insufficient documentation

## 2024-05-19 DIAGNOSIS — I110 Hypertensive heart disease with heart failure: Secondary | ICD-10-CM | POA: Diagnosis not present

## 2024-05-19 DIAGNOSIS — R0602 Shortness of breath: Secondary | ICD-10-CM | POA: Diagnosis not present

## 2024-05-19 LAB — BASIC METABOLIC PANEL WITH GFR
Anion gap: 13 (ref 5–15)
BUN: 21 mg/dL (ref 8–23)
CO2: 22 mmol/L (ref 22–32)
Calcium: 9.8 mg/dL (ref 8.9–10.3)
Chloride: 103 mmol/L (ref 98–111)
Creatinine, Ser: 1 mg/dL (ref 0.44–1.00)
GFR, Estimated: 50 mL/min — ABNORMAL LOW (ref >60)
Glucose, Bld: 124 mg/dL — ABNORMAL HIGH (ref 70–99)
Potassium: 4.1 mmol/L (ref 3.5–5.1)
Sodium: 138 mmol/L (ref 135–145)

## 2024-05-19 LAB — TROPONIN T, HIGH SENSITIVITY
Troponin T High Sensitivity: <15 ng/L (ref 0–19)
Troponin T High Sensitivity: 15 ng/L (ref 0–19)

## 2024-05-19 LAB — CBC
HCT: 44.4 % (ref 36.0–46.0)
Hemoglobin: 14.8 g/dL (ref 12.0–15.0)
MCH: 31.7 pg (ref 26.0–34.0)
MCHC: 33.3 g/dL (ref 30.0–36.0)
MCV: 95.1 fL (ref 80.0–100.0)
Platelets: 227 K/uL (ref 150–400)
RBC: 4.67 MIL/uL (ref 3.87–5.11)
RDW: 13.5 % (ref 11.5–15.5)
WBC: 7.7 K/uL (ref 4.0–10.5)
nRBC: 0 % (ref 0.0–0.2)

## 2024-05-19 LAB — PRO BRAIN NATRIURETIC PEPTIDE: Pro Brain Natriuretic Peptide: 1574 pg/mL — ABNORMAL HIGH (ref <300.0)

## 2024-05-19 MED ORDER — FUROSEMIDE 10 MG/ML IJ SOLN
60.0000 mg | Freq: Once | INTRAMUSCULAR | Status: AC
  Administered 2024-05-19: 60 mg via INTRAVENOUS
  Filled 2024-05-19: qty 8

## 2024-05-19 NOTE — ED Notes (Signed)
 EDP, Stafford at bedside; update provided to patient and family.

## 2024-05-19 NOTE — Telephone Encounter (Signed)
 1. Are you currently SOB (can you hear that pt is SOB on the phone)? yes   2. How long have you been experiencing SOB? Since last visit    3. Are you SOB when sitting or when up moving around? When moving around   4. Are you currently experiencing any other symptoms? No  Transferred call.

## 2024-05-19 NOTE — ED Provider Notes (Signed)
 [facility] Provider Note    Event Date/Time   First MD Initiated Contact with Patient 05/19/24 1325     (approximate)   History   Shortness of Breath   HPI  [name] is a 89 y.o. female who presents to the ED for evaluation of Shortness of Breath   I reviewed cardiology clinic visit from 10/24.  History of nonischemic cardiomyopathy, left bundle, EF mildly reduced, improving on echo from March with EF 50% and grade 1 diastolic dysfunction..   Patient presents to the ED alongside her daughter for evaluation of 1-2 weeks of worsening shortness of breath and orthopnea.  Patient resides at a local independent living facility and independent in all of her ADLs   Physical Exam   Triage Vital Signs: ED Triage Vitals  Encounter Vitals Group     BP 05/19/24 1223 (!) 157/112     Girls Systolic BP Percentile --      Girls Diastolic BP Percentile --      Boys Systolic BP Percentile --      Boys Diastolic BP Percentile --      Pulse Rate 05/19/24 1223 68     Resp 05/19/24 1223 20     Temp 05/19/24 1223 97.6 F (36.4 C)     Temp Source 05/19/24 1223 Oral     SpO2 05/19/24 1223 96 %     Weight 05/19/24 1231 130 lb (59 kg)     Height 05/19/24 1231 5' (1.524 m)     Head Circumference --      Peak Flow --      Pain Score 05/19/24 1232 0     Pain Loc --      Pain Education --      Exclude from Growth Chart --     Most recent vital signs: Vitals:   05/19/24 1223 05/19/24 1334  BP: (!) 157/112   Pulse: 68 81  Resp: 20 (!) 23  Temp: 97.6 F (36.4 C)   SpO2: 96% 95%    General: Awake, no distress.  CV:  Good peripheral perfusion.  Blowing holosystolic murmur is noted Resp:  Mild tachypnea to the mid/upper 20s but no distress.  No wheezing. Abd:  No distention.  MSK:  No deformity noted.  Neuro:  No focal deficits appreciated. Other:     ED Results / Procedures / Treatments   Labs (all labs ordered are listed, but only abnormal  results are displayed) Labs Reviewed  BASIC METABOLIC PANEL WITH GFR - Abnormal; Notable for the following components:      Result Value   Glucose, Bld 124 (*)    GFR, Estimated 50 (*)    All other components within normal limits  PRO BRAIN NATRIURETIC PEPTIDE - Abnormal; Notable for the following components:   Pro Brain Natriuretic Peptide 1,574.0 (*)    All other components within normal limits  CBC  TROPONIN T, HIGH SENSITIVITY  TROPONIN T, HIGH SENSITIVITY    EKG Sinus rhythm with a rate of 65 bpm, left bundle morphology.  No STEMI by Sgarbossa criteria.  RADIOLOGY 2 view CXR interpreted by me with cardiomegaly mild pulmonary vascular congestion  Official radiology report(s): DG Chest 2 View Result Date: 05/19/2024 EXAM: 2 VIEW(S) XRAY OF THE CHEST 05/19/2024 12:53:00 PM COMPARISON: 08/23/2023 CLINICAL HISTORY: SHOB FINDINGS: LIMITATIONS/ARTIFACTS: Patient rotated left on the frontal. LUNGS AND PLEURA: Mild nonspecific interstitial coarsening. Right upper lobe scarring. No pleural effusion. No pneumothorax. HEART AND MEDIASTINUM: Moderate  cardiomegaly. Transverse aortic atherosclerosis. BONES AND SOFT TISSUES: Exaggeration of expected thoracic kyphosis. Suspect loss of vertebral body height at at least 1 mid thoracic level. Lower thoracic and upper lumbar moderate to severe compression deformities, suboptimally evaluated. IMPRESSION: 1. Cardiomegaly, without congestive heart failure or acute disease. 2. Aortic atherosclerosis (ICD-10 I70.0). Electronically signed by: Rockey Kilts MD 05/19/2024 02:10 PM EST RP Workstation: [id number]    PROCEDURES and INTERVENTIONS:  .1-3 Lead EKG Interpretation  Performed by: Claudene Rover, MD Authorized by: Claudene Rover, MD     Interpretation: normal     ECG rate:  78   ECG rate assessment: normal     Rhythm: sinus rhythm     Ectopy: none     Conduction: normal     Medications  furosemide (LASIX) injection 60 mg (60 mg Intravenous Given  05/19/24 1448)     IMPRESSION / MDM / ASSESSMENT AND PLAN / ED COURSE  I reviewed the triage vital signs and the nursing notes.  Differential diagnosis includes, but is not limited to, ACS, PTX, PNA, muscle strain/spasm, PE, dissection, anxiety, pleural effusion  {Patient presents with symptoms of an acute illness or injury that is potentially life-threatening.  Patient presents with signs of shortness of breath likely due to CHF exacerbation.  Shortness of breath on exertion and orthopnea with congested CXR and elevated BNP.  No chest pain and negative troponins.  Normal CBC and metabolic panel.  No evidence of pneumonia or pneumothorax on x-ray.  Will initiate diuresis and reassess the patient to be trend troponins.  If she is feeling better with IV diuresis she may be suitable for outpatient management with cardiology follow-up.      FINAL CLINICAL IMPRESSION(S) / ED DIAGNOSES   Final diagnoses:  SOB (shortness of breath)  Acute on chronic diastolic congestive heart failure (HCC)     Rx / DC Orders   ED Discharge Orders     None        Note:  This document was prepared using Dragon voice recognition software and may include unintentional dictation errors.   Claudene Rover, MD 05/19/24 [phone]

## 2024-05-19 NOTE — ED Notes (Signed)
Lab notified to add on BNP 

## 2024-05-19 NOTE — ED Triage Notes (Signed)
 Pt presents to the ED via POV from home. Pt reports SHOB x1 week. Pt reports an inconsistent cough that is occasionally productive. States that she feels SHOB at all times, but it increases with exertion. Pt A&Ox4. Denies pain.

## 2024-05-19 NOTE — Telephone Encounter (Signed)
 Spoke with patient who was noticeably short of breath while speaking, denies any additional symptoms than shortness of breath, states this has gotten worse over the weekend, advised patient to go to ER for treatment and evaluation, advised that we would send her to the ER if she came into office as they are better equipped to treat her, patient reluctant to go to ER

## 2024-05-19 NOTE — ED Notes (Signed)
EDP, Zuriah Bordas at bedside. 

## 2024-05-20 DIAGNOSIS — H401110 Primary open-angle glaucoma, right eye, stage unspecified: Secondary | ICD-10-CM | POA: Diagnosis not present

## 2024-05-20 DIAGNOSIS — H40003 Preglaucoma, unspecified, bilateral: Secondary | ICD-10-CM | POA: Diagnosis not present

## 2024-05-21 NOTE — Telephone Encounter (Signed)
 She was discharged from the ED.  Schedule a follow-up visit with APP early next week.

## 2024-05-23 ENCOUNTER — Ambulatory Visit: Attending: Cardiovascular Disease | Admitting: Cardiovascular Disease

## 2024-05-23 ENCOUNTER — Encounter: Payer: Self-pay | Admitting: Cardiovascular Disease

## 2024-05-23 VITALS — BP 110/60 | HR 69 | Ht 60.0 in | Wt 136.1 lb

## 2024-05-23 DIAGNOSIS — E782 Mixed hyperlipidemia: Secondary | ICD-10-CM | POA: Diagnosis not present

## 2024-05-23 DIAGNOSIS — I5022 Chronic systolic (congestive) heart failure: Secondary | ICD-10-CM

## 2024-05-23 DIAGNOSIS — R0602 Shortness of breath: Secondary | ICD-10-CM | POA: Diagnosis not present

## 2024-05-23 DIAGNOSIS — I10 Essential (primary) hypertension: Secondary | ICD-10-CM

## 2024-05-23 MED ORDER — FUROSEMIDE 20 MG PO TABS: ORAL_TABLET | ORAL | 3 refills | Status: DC

## 2024-05-23 NOTE — Progress Notes (Signed)
 Cardiology Office Note   Date:  05/23/2024   ID:  ONESHA Cook, DOB [birth year], MRN [id number]  PCP:  [name], MD  Cardiologist:   [name], MD   Chief Complaint  Patient presents with   Follow-up    SOB no complaints today. Meds reviewed verbally with pt.      History of Present Illness: [name] is a 89 y.o. female who presents for a follow up visit regarding nonischemic cardiomyopathy. She had cardiac catheterization in May 2014 which showed mild nonobstructive coronary artery disease involving the LAD and left circumflex. Ejection fraction was normal.  She does have left bundle branch block. Echocardiogram in November 2015 showed an ejection fraction of 45-50%, mild-to-moderate aortic regurgitation, mild mitral regurgitation and mild-to-moderate tricuspid regurgitation. Pulmonary pressure was normal.  She had TIA symptoms in June 2020.  Plavix  was added to aspirin .  Carotid Doppler showed mild nonobstructive carotid disease.  MRI of the brain showed no evidence of acute or subacute infarct.  She was noted to have widespread intracranial atherosclerotic disease with possible flow-limiting stenosis in the proximal right MCA and proximal left PCA.  She had outpatient monitor which showed no evidence of atrial fibrillation although she was found to have frequent short episodes of SVT.    Most recent echocardiogram in March 2023 showed  showed an EF of 50 to 55% with mild mitral regurgitation.  She recently went to the emergency room with increased shortness of breath and was found to have mildly elevated proBNP.  She was given 1 dose of IV Lasix .  Chest x-ray did not show significant pulmonary edema.  She is feeling better overall but still with intermittent shortness of breath and no chest pain.  Past Medical History:  Diagnosis Date   Aortic insufficiency    a. 04/2014 Echo: mild-mod AI; b. 08/2023 Echo: Mod AI (mean grad 9.0 mmHg).   Carotid artery disease     a. 11/2018 Carotid U/S: 1-49% bilat ICA stenosis.   Coronary artery disease, non-occlusive    a. 08/2012 Ex MV: EF 51%, no ischemia; b. 10/2012 Cath (performed in setting of chest pain): LM nl, LAD 20-30p, D1/2 nl, LCX 20-25%p, OM1 small, RCA large - nl. RPDA/RPLV nl-->med rx. EF 55-60%.   Diastolic dysfunction    a. 08/2023 Echo: GrI DD.   Hyperlipidemia    Hypertension    Left bundle branch block    Nonischemic cardiomyopathy (HCC)    a. 04/2014 Echo: EF 45-50%, mild-mod AI, mild MR, mild-mod TR; b. 08/2023 Echo: EF 50-55%, no rwma, mild LVH, GrI DD, nl RV fxn, mild MR, mod AI.   PSVT (paroxysmal supraventricular tachycardia)    a. 12/2018 Zio: 47 SVT runs (fastest 176, longests 17 beats).   TIA (transient ischemic attack) 11/2018   a. 12/2018 Zio: predominantly sinus rhyth, avg 66 (45-176), 47 SVT runs (longest 17 beats, fastest 176). Rare PVCs. No Afib.    Past Surgical History:  Procedure Laterality Date   ABDOMINAL HYSTERECTOMY     ankle     rods in both ankles   BREAST BIOPSY Bilateral YRS AGO   NEG   CARDIAC CATHETERIZATION     [facility]   left ankle  Octo 2012   s/p pinning , Krazinski   LEFT HEART CATHETERIZATION WITH CORONARY ANGIOGRAM N/A 10/31/2012   Procedure: LEFT HEART CATHETERIZATION WITH CORONARY ANGIOGRAM;  Surgeon: Rober LOISE Chroman, MD;  Location: [facility] CATH LAB;  Service: Cardiovascular;  Laterality: N/A;  Current Outpatient Medications  Medication Sig Dispense Refill   amLODipine  (NORVASC ) 5 MG tablet TAKE ONE (1) TABLET BY MOUTH TWO TIMES PER DAY 180 tablet 1   Calcium Carbonate-Vitamin D  600-200 MG-UNIT TABS Take 1 tablet by mouth daily.     clopidogrel  (PLAVIX ) 75 MG tablet Take 1 tablet (75 mg total) by mouth daily. 90 tablet 3   fish oil-omega-3 fatty acids 1000 MG capsule Take 1 g by mouth daily.     latanoprost (XALATAN) 0.005 % ophthalmic solution Place 1 drop into both eyes at bedtime.     lidocaine  (LIDODERM ) 5 % Place 1 patch onto the skin daily. Remove &  Discard patch within 12 hours or as directed by MD 30 patch 2   metoprolol  succinate (TOPROL -XL) 50 MG 24 hr tablet TAKE ONE TABLET BY MOUTH ONCE DAILY 90 tablet 3   Multiple Vitamin (MULTIVITAMIN) tablet Take 1 tablet by mouth daily.       Multiple Vitamins-Minerals (EYE VITAMINS PO) Take 1 tablet by mouth daily.     pravastatin  (PRAVACHOL ) 20 MG tablet TAKE ONE TABLET (20 MG) BY MOUTH EVERY EVENING 90 tablet 1   telmisartan  (MICARDIS ) 80 MG tablet TAKE ONE TABLET (80 MG TOTAL) BY MOUTH ONCE DAILY 90 tablet 1   Current Facility-Administered Medications  Medication Dose Route Frequency Provider Last Rate Last Admin   Romosozumab -aqqg (EVENITY ) 105 MG/1. injection 210 mg  210 mg Subcutaneous Q30 days [name], MD   210 mg at 02/25/24 1649   Romosozumab -aqqg (EVENITY ) 105 MG/1. injection 210 mg  210 mg Subcutaneous Once Tullo, Teresa L, MD       Romosozumab -aqqg (EVENITY ) 105 MG/1. injection 210 mg  210 mg Subcutaneous Q30 days [name], MD        Allergies:   Contrast media [iodinated contrast media] and Sulfa antibiotics    Social History:  The patient  reports that she has never smoked. She has never used smokeless tobacco. She reports that she does not drink alcohol and does not use drugs.   Family History:  The patient's family history includes Cancer in her father; Heart attack in her brother and mother; Hypertension in her father and mother; Stroke in her sister.    ROS:  Please see the history of present illness.   Otherwise, review of systems are positive for none.   All other systems are reviewed and negative.    PHYSICAL EXAM: VS:  BP 110/60 (BP Location: Left Arm, Patient Position: Sitting, Cuff Size: Normal)   Pulse 69   Ht 5' (1.524 m)   Wt 136 lb 2 oz (61.7 kg)   SpO2 95%   BMI 26.59 kg/m  , BMI Body mass index is 26.59 kg/m. GEN: Well nourished, well developed, in no acute distress  HEENT: normal  Neck: Mild JVD, carotid bruits, or  masses Cardiac: RRR; no rubs, or gallops,no edema . 1/ 6 systolic murmur in the aortic area Respiratory:  clear to auscultation bilaterally, normal work of breathing GI: soft, nontender, nondistended, + BS MS: no deformity or atrophy  Skin: warm and dry, no rash Neuro:  Strength and sensation are intact Psych: euthymic mood, full affect   EKG:  EKG is ordered today. The ekg ordered today demonstrates : Normal sinus rhythm Left axis deviation Left bundle branch block When compared with ECG of 19-May-2024 12:28, QRS axis Shifted left T wave inversion no longer evident in Inferior leads     Recent Labs: 07/04/2023: ALT 13 07/26/2023:  TSH 2.97 05/19/2024: BUN 21; Creatinine, Ser 1.00; Hemoglobin 14.8; Platelets 227; Potassium 4.1; Pro Brain Natriuretic Peptide 1,574.0; Sodium 138    Lipid Panel    Component Value Date/Time   CHOL 263 (H) 07/04/2023 0930   CHOL 226 (H) 09/16/2012 0542   TRIG 181.0 (H) 07/04/2023 0930   TRIG 109 09/16/2012 0542   HDL 53.00 07/04/2023 0930   HDL 60 09/16/2012 0542   CHOLHDL 5 07/04/2023 0930   VLDL 36.2 07/04/2023 0930   VLDL 22 09/16/2012 0542   LDLCALC 174 (H) 07/04/2023 0930   LDLCALC 144 (H) 09/16/2012 0542   LDLDIRECT 160.0 07/04/2023 0930      Wt Readings from Last 3 Encounters:  05/23/24 136 lb 2 oz (61.7 kg)  05/19/24 130 lb (59 kg)  04/11/24 138 lb 2 oz (62.7 kg)        ASSESSMENT AND PLAN:  1.  Chronic systolic heart failure: Due to nonischemic cardiomyopathy.  Most recent ejection fraction improved to 50 to 55%.  Mild volume overload.  I elected to add small dose of furosemide  20 mg 3 times per week.  Check basic metabolic profile in 1 week.  2. Essential hypertension: Blood pressure is well control on current medications.  She is on metoprolol , amlodipine  and telmisartan .  3.  Hyperlipidemia: Intolerance to statins and Zetia.  She is tolerating pravastatin  20 mg daily.  4.  Previous TIA: On clopidogrel  without  aspirin .   Disposition:   FU in 3 months  Signed,  [name], MD  05/23/2024 11:08 AM    [facility]

## 2024-05-23 NOTE — Patient Instructions (Signed)
 Medication Instructions:  START Furosemide  20 mg three times weekly (Monday, Wednesday, Friday)  *If you need a refill on your cardiac medications before your next appointment, please call your pharmacy*  Lab Work: Your provider would like for you to return in one week to have the following labs drawn: BMET.   Please go to [facility] [address] ([facility] [address], [place] [address] You do not need an appointment.  They are open from 8 am- 4:30 pm.  Lunch from 1:00 pm- 2:00 pm You will not need to be fasting.   You may also go to one of the following [facility]:  [address] [place], [place] [address] Phone: [phone] Lab hours: Mon-Fri 8 am- 5 pm    Lunch 12 pm- 1 pm  [address] [place],  [place]  [address]  [place]  Phone: [phone] Lab hours: 7 am- 4 pm Lunch 12 pm-1 pm   [address] [place],  [place]  [address]  [place]  Phone: [phone] Lab hours: Mon-Fri 8 am- 5 pm    Lunch 12 pm- 1 pm  If you have labs (blood work) drawn today and your tests are completely normal, you will receive your results only by: MyChart Message (if you have MyChart) OR A paper copy in the mail If you have any lab test that is abnormal or we need to change your treatment, we will call you to review the results.  Testing/Procedures: None ordered  Follow-Up: At [facility], you and your health needs are our priority.  As part of our continuing mission to provide you with exceptional heart care, our providers are all part of one team.  This team includes your primary Cardiologist (physician) and Advanced Practice Providers or APPs (Physician Assistants and Nurse Practitioners) who all work together to provide you with the care you need, when you need it.  Your next appointment:   3 month(s)  Provider:   You may see [name], MD or one of the following Advanced Practice Providers on your designated Care Team:   Lonni Meager, NP Lesley Maffucci,  PA-C Bernardino Bring, PA-C Cadence Alden, PA-C Tylene Lunch, NP Barnie Hila, NP    We recommend signing up for the patient portal called MyChart.  Sign up information is provided on this After Visit Summary.  MyChart is used to connect with patients for Virtual Visits (Telemedicine).  Patients are able to view lab/test results, encounter notes, upcoming appointments, etc.  Non-urgent messages can be sent to your provider as well.   To learn more about what you can do with MyChart, go to [email or web address].

## 2024-05-26 ENCOUNTER — Emergency Department

## 2024-05-26 ENCOUNTER — Other Ambulatory Visit: Payer: Self-pay

## 2024-05-26 ENCOUNTER — Emergency Department: Admission: EM | Admit: 2024-05-26 | Discharge: 2024-05-26 | Disposition: A | Discharge status: Home / Self Care

## 2024-05-26 DIAGNOSIS — Z8616 Personal history of COVID-19: Secondary | ICD-10-CM | POA: Diagnosis not present

## 2024-05-26 DIAGNOSIS — I509 Heart failure, unspecified: Secondary | ICD-10-CM | POA: Diagnosis not present

## 2024-05-26 DIAGNOSIS — R918 Other nonspecific abnormal finding of lung field: Secondary | ICD-10-CM | POA: Diagnosis not present

## 2024-05-26 DIAGNOSIS — M7121 Synovial cyst of popliteal space [Baker], right knee: Secondary | ICD-10-CM | POA: Diagnosis not present

## 2024-05-26 DIAGNOSIS — I130 Hypertensive heart and chronic kidney disease with heart failure and stage 1 through stage 4 chronic kidney disease, or unspecified chronic kidney disease: Secondary | ICD-10-CM | POA: Diagnosis not present

## 2024-05-26 DIAGNOSIS — I517 Cardiomegaly: Secondary | ICD-10-CM | POA: Diagnosis not present

## 2024-05-26 DIAGNOSIS — N183 Chronic kidney disease, stage 3 unspecified: Secondary | ICD-10-CM | POA: Diagnosis not present

## 2024-05-26 DIAGNOSIS — R0989 Other specified symptoms and signs involving the circulatory and respiratory systems: Secondary | ICD-10-CM | POA: Diagnosis not present

## 2024-05-26 DIAGNOSIS — R0602 Shortness of breath: Secondary | ICD-10-CM | POA: Diagnosis not present

## 2024-05-26 DIAGNOSIS — M25561 Pain in right knee: Secondary | ICD-10-CM | POA: Diagnosis not present

## 2024-05-26 LAB — CBC
HCT: 49.1 % — ABNORMAL HIGH (ref 36.0–46.0)
Hemoglobin: 15 g/dL (ref 12.0–15.0)
MCH: 31.3 pg (ref 26.0–34.0)
MCHC: 30.5 g/dL (ref 30.0–36.0)
MCV: 102.3 fL — ABNORMAL HIGH (ref 80.0–100.0)
Platelets: 183 K/uL (ref 150–400)
RBC: 4.8 MIL/uL (ref 3.87–5.11)
RDW: 13.5 % (ref 11.5–15.5)
WBC: 7.6 K/uL (ref 4.0–10.5)
nRBC: 0 % (ref 0.0–0.2)

## 2024-05-26 LAB — BASIC METABOLIC PANEL WITH GFR
Anion gap: 14 (ref 5–15)
BUN: 17 mg/dL (ref 8–23)
CO2: 24 mmol/L (ref 22–32)
Calcium: 9.8 mg/dL (ref 8.9–10.3)
Chloride: 103 mmol/L (ref 98–111)
Creatinine, Ser: 1.09 mg/dL — ABNORMAL HIGH (ref 0.44–1.00)
GFR, Estimated: 45 mL/min — ABNORMAL LOW (ref >60)
Glucose, Bld: 101 mg/dL — ABNORMAL HIGH (ref 70–99)
Potassium: 3.9 mmol/L (ref 3.5–5.1)
Sodium: 141 mmol/L (ref 135–145)

## 2024-05-26 LAB — TROPONIN T, HIGH SENSITIVITY: Troponin T High Sensitivity: 15 ng/L (ref 0–19)

## 2024-05-26 LAB — PRO BRAIN NATRIURETIC PEPTIDE: Pro Brain Natriuretic Peptide: 1710 pg/mL — ABNORMAL HIGH (ref <300.0)

## 2024-05-26 NOTE — ED Provider Notes (Signed)
 [facility] Provider Note    Event Date/Time   First MD Initiated Contact with Patient 05/26/24 1155     (approximate)   History   Leg Pain   HPI  [name] is a 89 y.o. female who presents today for evaluation of pain behind her right knee.  Patient reports that this has been ongoing for the last couple of days.  She denies any new swelling in her legs.  She denies history of PE or DVT.  She reports that her shortness of breath is overall improved from prior and she has been taking her Lasix  as prescribed.  She does not feel short of breath now.  She reports that she checks her weight every day and has not had any weight gain.  She denies new dyspnea on exertion.  She has not had any chest pain.  Patient Active Problem List   Diagnosis Date Noted   Abnormal EKG 08/24/2023   Chest pain 08/24/2023   COVID-19 virus infection 08/23/2023   AKI (acute kidney injury) 08/23/2023   Shortness of breath 08/23/2023   Itching in the vaginal area 07/28/2023   Age-related osteoporosis with current pathological fracture 06/06/2023   CKD (chronic kidney disease) stage 3, GFR 30-59 ml/min (HCC) 10/13/2020   Encounter for preventive health examination 04/11/2020   Hyperlipidemia, mixed 02/10/2020   Arteriosclerotic cerebrovascular disease 12/09/2018   TIA (transient ischemic attack) 11/27/2018   Insomnia due to psychological stress 06/04/2017   Screening for breast cancer 04/29/2016   Chronic systolic heart failure (HCC) 06/04/2015   Encounter for long-term (current) use of other high-risk medications 10/31/2014   Nonischemic cardiomyopathy (HCC) 06/03/2014   Exertional dyspnea 04/07/2014   Palpitations 03/23/2014   Routine general medical examination at a health care facility 03/19/2013   Chest pain, atypical 09/29/2012   Ankle edema 03/13/2012   History of ankle fracture 09/11/2011   Mixed hyperlipidemia 03/11/2011   Need for prophylactic vaccination and  inoculation against influenza 03/11/2011   Essential hypertension 03/11/2011          Physical Exam   Triage Vital Signs: ED Triage Vitals  Encounter Vitals Group     BP 05/26/24 1122 132/66     Girls Systolic BP Percentile --      Girls Diastolic BP Percentile --      Boys Systolic BP Percentile --      Boys Diastolic BP Percentile --      Pulse Rate 05/26/24 1122 69     Resp 05/26/24 1122 20     Temp 05/26/24 1122 97.9 F (36.6 C)     Temp Source 05/26/24 1122 Oral     SpO2 05/26/24 1122 96 %     Weight 05/26/24 1123 133 lb (60.3 kg)     Height 05/26/24 1123 5' (1.524 m)     Head Circumference --      Peak Flow --      Pain Score 05/26/24 1123 10     Pain Loc --      Pain Education --      Exclude from Growth Chart --     Most recent vital signs: Vitals:   05/26/24 1122 05/26/24 1433  BP: 132/66 109/71  Pulse: 69 72  Resp: 20 16  Temp: 97.9 F (36.6 C) 98 F (36.7 C)  SpO2: 96% 98%    Physical Exam Vitals and nursing note reviewed.  Constitutional:      General: Awake and alert. No acute  distress.    Appearance: Normal appearance. The patient is normal weight.  HENT:     Head: Normocephalic and atraumatic.     Mouth: Mucous membranes are moist.  Eyes:     General: PERRL. Normal EOMs        Right eye: No discharge.        Left eye: No discharge.     Conjunctiva/sclera: Conjunctivae normal.  Cardiovascular:     Rate and Rhythm: Normal rate.     Pulses: Normal pulses.  Pulmonary:     Effort: Pulmonary effort is normal. No respiratory distress.  No tachypnea.  Speaking easily in complete sentences.  No accessory muscle use.  No respiratory distress.    Breath sounds: Normal breath sounds.  Abdominal:     Abdomen is soft. There is no abdominal tenderness. No rebound or guarding. No distention. Musculoskeletal:        General: No swelling. Normal range of motion.     Cervical back: Normal range of motion and neck supple.  Right leg without appreciable  edema.  No calf tenderness.  Mild tenderness in the popliteal fossa without erythema. No pitting edema bilaterally. Skin:    General: Skin is warm and dry.     Capillary Refill: Capillary refill takes less than 2 seconds.     Findings: No rash.  Neurological:     Mental Status: The patient is awake and alert.      ED Results / Procedures / Treatments   Labs (all labs ordered are listed, but only abnormal results are displayed) Labs Reviewed  CBC - Abnormal; Notable for the following components:      Result Value   HCT 49.1 (*)    MCV 102.3 (*)    All other components within normal limits  PRO BRAIN NATRIURETIC PEPTIDE - Abnormal; Notable for the following components:   Pro Brain Natriuretic Peptide 1,710.0 (*)    All other components within normal limits  BASIC METABOLIC PANEL WITH GFR - Abnormal; Notable for the following components:   Glucose, Bld 101 (*)    Creatinine, Ser 1.09 (*)    GFR, Estimated 45 (*)    All other components within normal limits  TROPONIN T, HIGH SENSITIVITY  TROPONIN T, HIGH SENSITIVITY     EKG     RADIOLOGY I independently reviewed and interpreted imaging and agree with radiologists findings.     PROCEDURES:  Critical Care performed:   Procedures   MEDICATIONS ORDERED IN ED: Medications - No data to display   IMPRESSION / MDM / ASSESSMENT AND PLAN / ED COURSE  I reviewed the triage vital signs and the nursing notes.   Differential diagnosis includes, but is not limited to, Baker's cyst, DVT, pulmonary embolism, heart failure exacerbation.  I reviewed the patient's chart.  Patient was seen in the emergency department on 05/19/2024 for the same complaint.  At that time she was diagnosed with heart failure exacerbation in the setting of congested chest x-ray and elevated BNP.  She had normal troponins.  She was seen by cardiology 3 days ago and she was given a small dose of furosemide  20 mg 3 times per week.  Patient is awake and  alert, hemodynamically stable and afebrile.  She is nontoxic in appearance.  She is able to speak easily in complete sentences.  No obvious Rales, no peripheral edema, and has a normal oxygen saturation of 96% on room air.  She has not had any weight gain, no orthopnea.  She is in no acute distress and has no chest pain.  Her troponin is within normal limits.  I do not feel that she requires admission today and patient is in agreement.  BNP is minimally elevated, and chest x-ray does reveal findings consistent with heart failure.  Ultrasound is negative for DVT, I do not suspect pulmonary embolism today.  She does have findings consistent with a Baker's cyst.  She was given an Ace wrap for extra support and instructed to follow-up with orthopedics.  Given her mildly elevated BNP and her pulmonary congestion on x-ray, discussed with Dr. Clarine. Plan for her to take her Lasix  daily for 5 days instead of every other day, and then to have these rechecked with her cardiologist.  Referral was placed to the heart failure clinic for follow-up.  Patient understands and agrees with plan.  She also understands return precautions.  She was discharged in stable condition with her family.  Patient's presentation is most consistent with acute presentation with potential threat to life or bodily function.       FINAL CLINICAL IMPRESSION(S) / ED DIAGNOSES   Final diagnoses:  Baker's cyst of knee, right  Acute on chronic congestive heart failure, unspecified heart failure type [facility])     Rx / DC Orders   ED Discharge Orders          Ordered    AMB referral to CHF clinic        05/26/24 1502             Note:  This document was prepared using Dragon voice recognition software and may include unintentional dictation errors.   Laker Thompson E, PA-C 05/26/24 1503    Clarine Ozell LABOR, MD 05/27/24 [phone]

## 2024-05-26 NOTE — ED Notes (Signed)
Verified correct patient and correct discharge papers given. Pt alert and oriented X 4, stable for discharge. RR even and unlabored, color WNL. Discussed discharge instructions and follow-up as directed. Discharge medications discussed, when prescribed. Pt had opportunity to ask questions, and RN available to provide patient and/or family education.   

## 2024-05-26 NOTE — ED Triage Notes (Signed)
 Pt to ED via POV from home. Pt reports right posterior knee pain with radiation to calf. Pt also reports SOB. Pt denies blood thinners. No hx of bloods clots. Pt reports ongoing SOB that has been present since seen here on 12/1.

## 2024-05-26 NOTE — Discharge Instructions (Signed)
 Please follow-up with your outpatient provider and cardiologist for recheck.  As we discussed, take your Lasix  daily for the next 5 days and then resume your every other day pattern.  As for your knee, you have a Baker's cyst.  You may wear the Ace wrap fracture support.  You may follow-up with orthopedics for further management.  Please return for any new, worsening, or changing symptoms or other concerns.  It was a pleasure caring for you today.

## 2024-05-26 NOTE — ED Notes (Signed)
 Pt to xray

## 2024-05-26 NOTE — ED Notes (Signed)
 Pt wheeled to and from bathroom, minimal assistance required.

## 2024-05-26 NOTE — ED Notes (Signed)
 Pt in [place]

## 2024-05-27 ENCOUNTER — Telehealth: Payer: Self-pay

## 2024-05-27 NOTE — Telephone Encounter (Signed)
 Pt daughter is here and would like to speak with someone regarding her mothers taking a medication Dr. Darron just put her on because another DR told her something different just want clarification   05/23/24 Dr. Darron FATE Lasix  20 mg 3 days a week,  patient has since been to the ER and ER provider suggested taking lasix  20 mg daily for 1 week then have labs collected. Dr Darron approved increase with lab collection, lab is entered into chart, pt daughter will bring her back later in the week for lab collection

## 2024-05-30 ENCOUNTER — Ambulatory Visit: Admitting: Internal Medicine

## 2024-05-30 ENCOUNTER — Encounter: Payer: Self-pay | Admitting: Internal Medicine

## 2024-05-30 VITALS — BP 140/66 | HR 73 | Ht 60.0 in | Wt 135.2 lb

## 2024-05-30 DIAGNOSIS — M1711 Unilateral primary osteoarthritis, right knee: Secondary | ICD-10-CM | POA: Diagnosis not present

## 2024-05-30 DIAGNOSIS — M7121 Synovial cyst of popliteal space [Baker], right knee: Secondary | ICD-10-CM | POA: Diagnosis not present

## 2024-05-30 DIAGNOSIS — R0602 Shortness of breath: Secondary | ICD-10-CM | POA: Diagnosis not present

## 2024-05-30 DIAGNOSIS — M25461 Effusion, right knee: Secondary | ICD-10-CM | POA: Diagnosis not present

## 2024-05-30 DIAGNOSIS — L97511 Non-pressure chronic ulcer of other part of right foot limited to breakdown of skin: Secondary | ICD-10-CM | POA: Diagnosis not present

## 2024-05-30 DIAGNOSIS — N1832 Chronic kidney disease, stage 3b: Secondary | ICD-10-CM

## 2024-05-30 DIAGNOSIS — I5022 Chronic systolic (congestive) heart failure: Secondary | ICD-10-CM

## 2024-05-30 DIAGNOSIS — M810 Age-related osteoporosis without current pathological fracture: Secondary | ICD-10-CM

## 2024-05-30 MED ORDER — AMLODIPINE BESYLATE 5 MG PO TABS: ORAL_TABLET | ORAL | 1 refills | Status: AC

## 2024-05-30 MED ORDER — ROMOSOZUMAB-AQQG 105 MG/1.17ML ~~LOC~~ SOSY: 210.0000 mg | PREFILLED_SYRINGE | SUBCUTANEOUS | Status: AC

## 2024-05-30 NOTE — Assessment & Plan Note (Signed)
 Renal function has been stable  with avoidance of NSAIDs.  She is on an ARB for control of hypertension,, a statin for control of hyperlipidemia, but has recently been prescribed furosemide  for pulm vasc congestion with normal EF by most recent ECHO.  The furosemide  schedule is daily use x 5,  then every other day.  A  Repeat BMET was ordered and drawn by cardiology today, results pending

## 2024-05-30 NOTE — Patient Instructions (Addendum)
 I agree with reducing the lasix  (furosemide  ) dose to every other day  If you have any more nausea today, SKIP your  furosemide  dose tomorrow and resume on Sunday on an every other day schedule   I will see you in 6 months,  sooner if needed   Best wishes for a joyous Christmas season and a happy New Year!  Verneita Kettering, MD =

## 2024-05-30 NOTE — Progress Notes (Unsigned)
 Subjective:  Patient ID: [name], female    DOB: 03-Jan-1923  Age: 89 y.o. MRN: [id number]  CC: There were no encounter diagnoses.   HPI [name] presents for  Chief Complaint  Patient presents with   Medical Management of Chronic Issues   1) osteoporosis: receiving Evenity  injections monthly, due today    Follow up on 2 ER visits in the last  week :   1) progressive  dyspnea Dec 1   2 week history.  Given lasix  60 mg IB+V x 1 for pulm vas congestion on chest x ray and BNP > 1000.  EKG no acute changes.  Seeon on Dec 5 bb cardiology f, and was advised to continue lasix  20 three itmes per week .  However she returned to ER on Dec 8 with right leg pain and during workup for DVT/PE her dose was increased to daily x 5 days for  persistenet BNP > 1000 .  She has had a BMET toay at her cardiologist's office.    she is still urinating frequently although not as much as previously .  She has noted some mild nausea  this morning (Day 4 of daily lasix ).  She denies orthopnea, sleeps on 1 pillow in a regular bed   2) Dec 8:  dxd with Baker's Cyst .  Knee wrapped in pressure bandage and And sent to orthopedics.  Received a cortisone right lateral knee today  .  Knee currently feels  slightly better,    has been told to follow up with kubinski if no improvement.    3) She has been ambulating with a walker since she stopped  driving a few months ago     Outpatient Medications Prior to Visit  Medication Sig Dispense Refill   amLODipine  (NORVASC ) 5 MG tablet TAKE ONE (1) TABLET BY MOUTH TWO TIMES PER DAY 180 tablet 1   Calcium Carbonate-Vitamin D  600-200 MG-UNIT TABS Take 1 tablet by mouth daily.     clopidogrel  (PLAVIX ) 75 MG tablet Take 1 tablet (75 mg total) by mouth daily. 90 tablet 3   fish oil-omega-3 fatty acids 1000 MG capsule Take 1 g by mouth daily.     furosemide  (LASIX ) 20 MG tablet Take one tablet on Monday, Wednesday and Friday 15 tablet 3   latanoprost (XALATAN) 0.005 %  ophthalmic solution Place 1 drop into both eyes at bedtime.     lidocaine  (LIDODERM ) 5 % Place 1 patch onto the skin daily. Remove & Discard patch within 12 hours or as directed by MD 30 patch 2   metoprolol  succinate (TOPROL -XL) 50 MG 24 hr tablet TAKE ONE TABLET BY MOUTH ONCE DAILY 90 tablet 3   Multiple Vitamin (MULTIVITAMIN) tablet Take 1 tablet by mouth daily.       Multiple Vitamins-Minerals (EYE VITAMINS PO) Take 1 tablet by mouth daily.     pravastatin  (PRAVACHOL ) 20 MG tablet TAKE ONE TABLET (20 MG) BY MOUTH EVERY EVENING 90 tablet 1   telmisartan  (MICARDIS ) 80 MG tablet TAKE ONE TABLET (80 MG TOTAL) BY MOUTH ONCE DAILY 90 tablet 1   Facility-Administered Medications Prior to Visit  Medication Dose Route Frequency Provider Last Rate Last Admin   Romosozumab -aqqg (EVENITY ) 105 MG/1. injection 210 mg  210 mg Subcutaneous Q30 days [name], MD   210 mg at 02/25/24 1649   Romosozumab -aqqg (EVENITY ) 105 MG/1. injection 210 mg  210 mg Subcutaneous Once Sha Amer L, MD  Romosozumab -aqqg (EVENITY ) 105 MG/1. injection 210 mg  210 mg Subcutaneous Q30 days [name], MD        Review of Systems;  Patient denies headache, fevers, malaise, unintentional weight loss, skin rash, eye pain, sinus congestion and sinus pain, sore throat, dysphagia,  hemoptysis , cough, dyspnea, wheezing, chest pain, palpitations, orthopnea, edema, abdominal pain, nausea, melena, diarrhea, constipation, flank pain, dysuria, hematuria, urinary  Frequency, nocturia, numbness, tingling, seizures,  Focal weakness, Loss of consciousness,  Tremor, insomnia, depression, anxiety, and suicidal ideation.      Objective:  BP (!) 140/66   Pulse 73   Ht 5' (1.524 m)   Wt 135 lb 3.2 oz (61.3 kg)   SpO2 94%   BMI 26.40 kg/m   BP Readings from Last 3 Encounters:  05/30/24 (!) 140/66  05/26/24 109/71  05/23/24 110/60    Wt Readings from Last 3 Encounters:  05/30/24 135 lb 3.2 oz (61.3 kg)   05/26/24 133 lb (60.3 kg)  05/23/24 136 lb 2 oz (61.7 kg)    Physical Exam  Lab Results  Component Value Date   HGBA1C 5.7 07/26/2023   HGBA1C 5.9 01/02/2023   HGBA1C 5.9 04/19/2022    Lab Results  Component Value Date   CREATININE 1.09 (H) 05/26/2024   CREATININE 1.00 05/19/2024   CREATININE 0.93 08/24/2023    Lab Results  Component Value Date   WBC 7.6 05/26/2024   HGB 15.0 05/26/2024   HCT 49.1 (H) 05/26/2024   PLT 183 05/26/2024   GLUCOSE 101 (H) 05/26/2024   CHOL 263 (H) 07/04/2023   TRIG 181.0 (H) 07/04/2023   HDL 53.00 07/04/2023   LDLDIRECT 160.0 07/04/2023   LDLCALC 174 (H) 07/04/2023   ALT 13 07/04/2023   AST 20 07/04/2023   NA 141 05/26/2024   K 3.9 05/26/2024   CL 103 05/26/2024   CREATININE 1.09 (H) 05/26/2024   BUN 17 05/26/2024   CO2 24 05/26/2024   TSH 2.97 07/26/2023   HGBA1C 5.7 07/26/2023    DG Chest 2 View Result Date: 05/26/2024 CLINICAL DATA:  Shortness of breath with posterior right knee pain radiating into the calf. EXAM: CHEST - 2 VIEW COMPARISON:  Radiographs 05/19/2024 and 08/23/2023. FINDINGS: Lower lung volumes. Allowing for this, the heart size and mediastinal contours are stable with moderate cardiac enlargement and aortic atherosclerosis. Suspected new mild pulmonary edema with patchy atelectasis at both lung bases. No confluent airspace disease, significant pleural effusion or pneumothorax. Multiple thoracolumbar compression deformities are grossly stable. No acute osseous findings are seen. IMPRESSION: Cardiomegaly with suspected new mild pulmonary edema and bibasilar atelectasis. No confluent airspace disease. Electronically Signed   By: Elsie Perone M.D.   On: 05/26/2024 13:24   US  Venous Img Lower Unilateral Right Result Date: 05/26/2024 CLINICAL DATA:  Right knee pain.  Shortness of breath. EXAM: RIGHT LOWER EXTREMITY VENOUS DOPPLER ULTRASOUND TECHNIQUE: Gray-scale sonography with graded compression, as well as color Doppler  and duplex ultrasound were performed to evaluate the lower extremity deep venous systems from the level of the common femoral vein and including the common femoral, femoral, profunda femoral, popliteal and calf veins including the posterior tibial, peroneal and gastrocnemius veins when visible. Spectral Doppler was utilized to evaluate flow at rest and with distal augmentation maneuvers in the common femoral, femoral and popliteal veins. COMPARISON:  None Available. FINDINGS: Contralateral Common Femoral Vein: Respiratory phasicity is normal and symmetric with the symptomatic side. No evidence of thrombus. Normal compressibility. Common Femoral Vein: No  evidence of thrombus. Normal compressibility, respiratory phasicity and response to augmentation. Saphenofemoral Junction: No evidence of thrombus. Normal compressibility and flow on color Doppler imaging. Profunda Femoral Vein: No evidence of thrombus. Normal compressibility and flow on color Doppler imaging. Femoral Vein: No evidence of thrombus. Normal compressibility, respiratory phasicity and response to augmentation. Popliteal Vein: No evidence of thrombus. Normal compressibility, respiratory phasicity and response to augmentation. Calf Veins: No evidence of thrombus. Normal compressibility and flow on color Doppler imaging. Other Findings: Small hypoechoic collection in the right popliteal fossa. This fluid collection measures 2.6 x 1.2 x 2.6 cm. IMPRESSION: 1. Negative for deep venous thrombosis in right lower extremity. 2. Small fluid collection in the right popliteal fossa measuring up to 2.6 cm. Electronically Signed   By: Juliene Balder M.D.   On: 05/26/2024 12:49    Assessment & Plan:  .There are no diagnoses linked to this encounter.   I spent 34 minutes on the day of this face to face encounter reviewing patient's  most recent visit with cardiology,  nephrology,  and neurology,  prior relevant surgical and non surgical procedures, recent  labs and  imaging studies, counseling on weight management,  reviewing the assessment and plan with patient, and post visit ordering and reviewing of  diagnostics and therapeutics with patient  .   Follow-up: No follow-ups on file.   Verneita LITTIE Kettering, MD

## 2024-05-31 LAB — BASIC METABOLIC PANEL WITH GFR
BUN/Creatinine Ratio: 17 (ref 12–28)
BUN: 22 mg/dL (ref 10–36)
CO2: 23 mmol/L (ref 20–29)
Calcium: 10.1 mg/dL (ref 8.7–10.3)
Chloride: 102 mmol/L (ref 96–106)
Creatinine, Ser: 1.28 mg/dL — ABNORMAL HIGH (ref 0.57–1.00)
Glucose: 97 mg/dL (ref 70–99)
Potassium: 3.9 mmol/L (ref 3.5–5.2)
Sodium: 143 mmol/L (ref 134–144)
eGFR: 38 mL/min/1.73 — ABNORMAL LOW (ref >59)

## 2024-06-01 DIAGNOSIS — M7121 Synovial cyst of popliteal space [Baker], right knee: Secondary | ICD-10-CM | POA: Insufficient documentation

## 2024-06-01 NOTE — Assessment & Plan Note (Signed)
 Treated by orthopedics following first ER visit

## 2024-06-01 NOTE — Assessment & Plan Note (Addendum)
 Complicated by CKD.  Continued use of furosemide  has resulted  dehydration by exam and  drop in GFR.  She has been advised to reduce furosemide  to every other day

## 2024-06-02 ENCOUNTER — Other Ambulatory Visit: Payer: Self-pay | Admitting: Internal Medicine

## 2024-06-02 ENCOUNTER — Ambulatory Visit: Payer: Self-pay | Admitting: Cardiovascular Disease

## 2024-06-17 ENCOUNTER — Telehealth: Payer: Self-pay | Admitting: Nurse Practitioner

## 2024-06-17 NOTE — Telephone Encounter (Signed)
 Copied from CRM #[id number]. Topic: General - Other >> Jun 17, 2024  3:43 PM Alfonso HERO wrote: Reason for CRM: patient daughter calling to get on update on the POA paperwork she turned in

## 2024-06-18 NOTE — Telephone Encounter (Signed)
 Spoke with pt's daughter she stated that she is thinking ahead in case of an emergency with her mother while she is down from a knee surgery she will be having,  she wanted to know if we or [facility] could see the POA paperwork if pt were to need to go to the ED for any reason during that time. I let her know that yes [facility] would be able to see that.

## 2024-06-23 ENCOUNTER — Ambulatory Visit: Attending: Cardiovascular Disease | Admitting: Cardiovascular Disease

## 2024-06-23 ENCOUNTER — Other Ambulatory Visit: Payer: Self-pay | Admitting: Orthopedic Surgery

## 2024-06-23 ENCOUNTER — Telehealth: Payer: Self-pay

## 2024-06-23 ENCOUNTER — Other Ambulatory Visit: Payer: Self-pay | Admitting: Emergency Medicine

## 2024-06-23 ENCOUNTER — Telehealth: Payer: Self-pay | Admitting: Emergency Medicine

## 2024-06-23 ENCOUNTER — Encounter: Payer: Self-pay | Admitting: Cardiovascular Disease

## 2024-06-23 VITALS — BP 144/60 | HR 67 | Ht 60.0 in | Wt 134.5 lb

## 2024-06-23 DIAGNOSIS — I10 Essential (primary) hypertension: Secondary | ICD-10-CM | POA: Insufficient documentation

## 2024-06-23 DIAGNOSIS — R0602 Shortness of breath: Secondary | ICD-10-CM | POA: Diagnosis not present

## 2024-06-23 DIAGNOSIS — I5022 Chronic systolic (congestive) heart failure: Secondary | ICD-10-CM | POA: Diagnosis not present

## 2024-06-23 DIAGNOSIS — M84361A Stress fracture, right tibia, initial encounter for fracture: Secondary | ICD-10-CM

## 2024-06-23 DIAGNOSIS — I2510 Atherosclerotic heart disease of native coronary artery without angina pectoris: Secondary | ICD-10-CM | POA: Insufficient documentation

## 2024-06-23 MED ORDER — FUROSEMIDE 20 MG PO TABS: ORAL_TABLET | ORAL | 1 refills | Status: AC

## 2024-06-23 NOTE — Telephone Encounter (Signed)
"  ° °  Received a page from pharmacy requesting information about a new prescription for patient.  On chart review patient seen by Dr. Gollan today reporting shortness of breath suspected to be due to volume overload. She was prescribed prn lasix . Goal weight <130lbs.  Spoke to [facility] and expressed that the instructions are to take lasix  20 mg daily until patient achieves dry weight <130lbs. She is then to take lasix  20 mg as needed for weight gain and peripheral edema.   Caller verbalized understanding and was grateful for the call back.  Leontine LOISE Salen, PA-C 06/23/2024, 6:36 PM   "

## 2024-06-23 NOTE — Telephone Encounter (Signed)
 Pt seen after lab draw ordered by Dr Darron after diuretic dose change - pt NoAcuteDistress without LowerLegExtremity swelling, pulse ox 92%, HR 59, appears with daughter, hx of CHF  Pt requesting inhaler like the one my husband had that helped him breathe  DOD appt slot given

## 2024-06-23 NOTE — Progress Notes (Signed)
 Cardiology Office Note  Date:  06/23/2024   ID:  RASHAN PATIENT, DOB [birth year], MRN [id number]  PCP:  [name], MD   Chief Complaint  Patient presents with   Shortness of Breath    Patient c/o shortness of breath with little to no exertion with just walking to the bathroom and left leg swelling at times.  Patient just finished Prednisone round for her right knee.     HPI:  [name] is a 89 y.o. female with past medical history of: Past Medical History:  Diagnosis Date   Aortic insufficiency    a. 04/2014 Echo: mild-mod AI; b. 08/2023 Echo: Mod AI (mean grad 9.0 mmHg).   Carotid artery disease    a. 11/2018 Carotid U/S: 1-49% bilat ICA stenosis.   Coronary artery disease, non-occlusive    a. 08/2012 Ex MV: EF 51%, no ischemia; b. 10/2012 Cath (performed in setting of chest pain): LM nl, LAD 20-30p, D1/2 nl, LCX 20-25%p, OM1 small, RCA large - nl. RPDA/RPLV nl-->med rx. EF 55-60%.   Diastolic dysfunction    a. 08/2023 Echo: GrI DD.   Hyperlipidemia    Hypertension    Left bundle branch block    Nonischemic cardiomyopathy (HCC)    a. 04/2014 Echo: EF 45-50%, mild-mod AI, mild MR, mild-mod TR; b. 08/2023 Echo: EF 50-55%, no rwma, mild LVH, GrI DD, nl RV fxn, mild MR, mod AI.   PSVT (paroxysmal supraventricular tachycardia)    a. 12/2018 Zio: 47 SVT runs (fastest 176, longests 17 beats).   TIA (transient ischemic attack) 11/2018   a. 12/2018 Zio: predominantly sinus rhyth, avg 66 (45-176), 47 SVT runs (longest 17 beats, fastest 176). Rare PVCs. No Afib.  Nonischemic cardiomyopathy Patient of Dr. Darron Who presents for worsening shortness of breath, CHF management  Lives at [facility] Last seen in clinic by cardiology May 23, 2024 On that visit reported shortness of breath  Recent echo detailing EF 50 to 55% in March 2025  Was recently in the emergency room with elevated BNP 1700, shortness of breath May 26, 2024 new mild pulmonary edema on chest x-ray Given 1  dose IV Lasix   On her last visit Lasix  23 times a week was added Most recent creatinine 1.28 BUN 22 BNP 1700  Reports weight at home 132 at home  EKG personally reviewed by myself on todays visit EKG Interpretation Date/Time:  Monday June 23 2024 15:34:42 EST Ventricular Rate:  67 PR Interval:  164 QRS Duration:  148 QT Interval:  436 QTC Calculation: 460 R Axis:   -8  Text Interpretation: Normal sinus rhythm Left bundle branch block When compared with ECG of 26-May-2024 11:31, Premature atrial complexes are no longer Present T wave inversion no longer evident in Inferior leads Confirmed by Perla Lye [phone]) on 06/23/2024 5:53:42 PM     PMH:   has a past medical history of Aortic insufficiency, Carotid artery disease, Coronary artery disease, non-occlusive, Diastolic dysfunction, Hyperlipidemia, Hypertension, Left bundle branch block, Nonischemic cardiomyopathy (HCC), PSVT (paroxysmal supraventricular tachycardia), and TIA (transient ischemic attack) (11/2018).   PSH:    Past Surgical History:  Procedure Laterality Date   ABDOMINAL HYSTERECTOMY     ankle     rods in both ankles   BREAST BIOPSY Bilateral YRS AGO   NEG   CARDIAC CATHETERIZATION     [facility]   left ankle  Octo 2012   s/p pinning , Krazinski   LEFT HEART CATHETERIZATION WITH CORONARY ANGIOGRAM N/A 10/31/2012  Procedure: LEFT HEART CATHETERIZATION WITH CORONARY ANGIOGRAM;  Surgeon: Rober LOISE Chroman, MD;  Location: [facility] CATH LAB;  Service: Cardiovascular;  Laterality: N/A;    Current Outpatient Medications  Medication Sig Dispense Refill   amLODipine  (NORVASC ) 5 MG tablet TAKE ONE (1) TABLET BY MOUTH TWO TIMES PER DAY 180 tablet 1   Calcium Carbonate-Vitamin D  600-200 MG-UNIT TABS Take 1 tablet by mouth daily.     clopidogrel  (PLAVIX ) 75 MG tablet Take 1 tablet (75 mg total) by mouth daily. 90 tablet 3   fish oil-omega-3 fatty acids 1000 MG capsule Take 1 g by mouth daily.     HYDROcodone-acetaminophen   (NORCO/VICODIN) 5-325 MG tablet Take 1 tablet by mouth every 6 (six) hours as needed.     latanoprost (XALATAN) 0.005 % ophthalmic solution Place 1 drop into both eyes at bedtime.     lidocaine  (LIDODERM ) 5 % Place 1 patch onto the skin daily. Remove & Discard patch within 12 hours or as directed by MD 30 patch 2   metoprolol  succinate (TOPROL -XL) 50 MG 24 hr tablet TAKE ONE TABLET BY MOUTH ONCE DAILY 90 tablet 3   Multiple Vitamin (MULTIVITAMIN) tablet Take 1 tablet by mouth daily.       Multiple Vitamins-Minerals (EYE VITAMINS PO) Take 1 tablet by mouth daily.     pravastatin  (PRAVACHOL ) 20 MG tablet TAKE ONE TABLET (20 MG) BY MOUTH EVERY EVENING 90 tablet 1   telmisartan  (MICARDIS ) 80 MG tablet TAKE ONE TABLET (80 MG TOTAL) BY MOUTH ONCE DAILY 90 tablet 1   furosemide  (LASIX ) 20 MG tablet Daily as needed 90 tablet 1   Current Facility-Administered Medications  Medication Dose Route Frequency Provider Last Rate Last Admin   Romosozumab -aqqg (EVENITY ) 105 MG/1. injection 210 mg  210 mg Subcutaneous Q30 days [name], MD   210 mg at 02/25/24 1649   Romosozumab -aqqg (EVENITY ) 105 MG/1. injection 210 mg  210 mg Subcutaneous Once Tullo, Teresa L, MD       Romosozumab -aqqg (EVENITY ) 105 MG/1. injection 210 mg  210 mg Subcutaneous Q30 days [name], MD   210 mg at 05/30/24 1401   [START ON 06/29/2024] Romosozumab -aqqg (EVENITY ) 105 MG/1. injection 210 mg  210 mg Subcutaneous Q30 days [name], MD         Allergies:   Contrast media [iodinated contrast media] and Sulfa antibiotics   Social History:  The patient  reports that she has never smoked. She has never used smokeless tobacco. She reports that she does not drink alcohol and does not use drugs.   Family History:   family history includes Cancer in her father; Heart attack in her brother and mother; Hypertension in her father and mother; Stroke in her sister.    Review of Systems: Review of Systems   Constitutional: Negative.   HENT: Negative.    Respiratory: Negative.    Cardiovascular: Negative.   Gastrointestinal: Negative.   Musculoskeletal: Negative.   Neurological: Negative.   Psychiatric/Behavioral: Negative.    All other systems reviewed and are negative.  PHYSICAL EXAM: VS:  BP (!) 144/60 (BP Location: Left Arm, Patient Position: Sitting, Cuff Size: Normal)   Pulse 67   Ht 5' (1.524 m)   Wt 134 lb 8 oz (61 kg)   SpO2 95%   BMI 26.27 kg/m  , BMI Body mass index is 26.27 kg/m. GEN: Well nourished, well developed, in no acute distress HEENT: normal Neck: no JVD, carotid bruits, or masses Cardiac: RRR; no murmurs,  rubs, or gallops,no edema  Respiratory:  clear to auscultation bilaterally, normal work of breathing GI: soft, nontender, nondistended, + BS MS: no deformity or atrophy Skin: warm and dry, no rash Neuro:  Strength and sensation are intact Psych: euthymic mood, full affect  Recent Labs: 07/04/2023: ALT 13 07/26/2023: TSH 2.97 05/26/2024: Hemoglobin 15.0; Platelets 183; Pro Brain Natriuretic Peptide 1,710.0 05/30/2024: BUN 22; Creatinine, Ser 1.28; Potassium 3.9; Sodium 143   Lipid Panel Lab Results  Component Value Date   CHOL 263 (H) 07/04/2023   HDL 53.00 07/04/2023   LDLCALC 174 (H) 07/04/2023   TRIG 181.0 (H) 07/04/2023    Wt Readings from Last 3 Encounters:  06/23/24 134 lb 8 oz (61 kg)  05/30/24 135 lb 3.2 oz (61.3 kg)  05/26/24 133 lb (60.3 kg)     ASSESSMENT AND PLAN:  Problem List Items Addressed This Visit       Cardiology Problems   Chronic systolic heart failure (HCC) - Primary   Relevant Medications   furosemide  (LASIX ) 20 MG tablet   Other Relevant Orders   EKG 12-Lead (Completed)   Basic metabolic panel with GFR   AMB referral to [facility]   Essential hypertension   Relevant Medications   furosemide  (LASIX ) 20 MG tablet   Other Relevant Orders   EKG 12-Lead (Completed)   Other Visit Diagnoses       SOB  (shortness of breath)       Relevant Orders   EKG 12-Lead (Completed)     Coronary artery disease involving native coronary artery of native heart without angina pectoris       Relevant Medications   furosemide  (LASIX ) 20 MG tablet   Other Relevant Orders   EKG 12-Lead (Completed)      Chronic diastolic CHF Reports that she took Lasix  for 1 week in December, shortness of breath improved She has had recurrence of her shortness of breath taking Lasix  3 days a week - Reports having high fluid intake, weight 132 pounds - We have recommended she take Lasix  20 mg daily as needed, suspect she will need 5 days a week, goal weight less than 130 pounds - Recheck BMP 1 month - Long discussion about high salt meals at [facility], cutting back on her fluid intake - Referral made to CHF clinic in [facility]  Hyperlipidemia Tolerating pravastatin  20 mg daily  Coronary artery disease Continue beta-blocker, pravastatin , Plavix   Essential hypertension Blood pressure is well controlled on today's visit. No changes made to the medications.   Signed, Velinda Lunger, M.D., Ph.D. [facility] [place], [place] [phone]

## 2024-06-23 NOTE — Patient Instructions (Addendum)
 Referral to CHF clinic in [place]  Medication Instructions:   Lasix  as needed Goal weight <130  If you need a refill on your cardiac medications before your next appointment, please call your pharmacy.   Lab work: Your provider would like for you to return in 1 month to have the following labs drawn: BMP.   Please go to [facility] [address] ([facility] [address], [place] [address] You do not need an appointment.  They are open from 8 am- 4:30 pm.  Lunch from 1:00 pm- 2:00 pm You will not need to be fasting.   Testing/Procedures: No new testing needed  Follow-Up: At [facility], you and your health needs are our priority.  As part of our continuing mission to provide you with exceptional heart care, we have created designated Provider Care Teams.  These Care Teams include your primary Cardiologist (physician) and Advanced Practice Providers (APPs -  Physician Assistants and Nurse Practitioners) who all work together to provide you with the care you need, when you need it.  You will need a follow up appointment in 2 months with Dr. Darron  Providers on your designated Care Team:   Lonni Meager, NP Bernardino Bring, PA-C Cadence [place], [place]  COVID-19 Vaccine Information can be found at: [email or web address] For questions related to vaccine distribution or appointments, please email [email or web address] or call [phone].

## 2024-06-24 ENCOUNTER — Ambulatory Visit
Admission: RE | Admit: 2024-06-24 | Discharge: 2024-06-24 | Disposition: A | Discharge status: Home / Self Care | Source: Ambulatory Visit | Attending: Orthopedic Surgery | Admitting: Orthopedic Surgery

## 2024-06-24 ENCOUNTER — Telehealth: Payer: Self-pay

## 2024-06-24 DIAGNOSIS — M84361A Stress fracture, right tibia, initial encounter for fracture: Secondary | ICD-10-CM | POA: Diagnosis present

## 2024-06-24 LAB — BASIC METABOLIC PANEL WITH GFR
BUN/Creatinine Ratio: 21 (ref 12–28)
BUN: 24 mg/dL (ref 10–36)
CO2: 22 mmol/L (ref 20–29)
Calcium: 9.7 mg/dL (ref 8.7–10.3)
Chloride: 102 mmol/L (ref 96–106)
Creatinine, Ser: 1.17 mg/dL — ABNORMAL HIGH (ref 0.57–1.00)
Glucose: 103 mg/dL — ABNORMAL HIGH (ref 70–99)
Potassium: 4.6 mmol/L (ref 3.5–5.2)
Sodium: 139 mmol/L (ref 134–144)
eGFR: 42 mL/min/1.73 — ABNORMAL LOW

## 2024-06-24 NOTE — Telephone Encounter (Signed)
 Evenity  VOB initiated via [email or web address]  Last Evenity  inj: 04/29/24 Next Evenity  inj DUE: NOW

## 2024-06-24 NOTE — Progress Notes (Signed)
 This note has been created using automated tools and reviewed for accuracy by [facility].  Chief Complaint  Patient presents with   Right Knee - Pain    Subjective  [name] is a 89 y.o. female who presents for Pain of the Right Knee HPI History of Present Illness [name] is a 89 year old female with right knee osteoarthritis who presents with persistent severe right knee pain and swelling.  She reports severe, constant right knee pain for approximately one month, localized to the knee without radiation. She denies recent trauma or injury within the past three to four months. The pain significantly impairs her mobility, requiring her to hold onto objects to prevent falls. Her activity level has markedly decreased since symptom onset; previously, her activity was at baseline.  Recent right knee x-rays demonstrated mild osteoarthritis. She received two intraarticular steroid injections without relief. Aspiration of a small Baker's cyst by Dr. Sharrie did not improve symptoms. She has trialed topical lidocaine  patches, which caused discomfort, and ice, which was ineffective. She reports taking Tylenol  for pain.  She has hardware in both feet from prior fractures and a chronic right toe ulcer. She has a history of vertebral compression fracture and spondylolisthesis with chronic back pain, but currently denies radicular symptoms. She has not had a recent hip x-ray at this facility but underwent a bone scan in November of the previous year.  Review of Systems  Patient Active Problem List  Diagnosis   Ankle edema   Chronic systolic heart failure (CMS/HHS-HCC)   Exertional dyspnea   Fatigue   Hypertension   Insomnia due to psychological stress   Mixed hyperlipidemia   Nonischemic cardiomyopathy (CMS/HHS-HCC)   Palpitations   Arteriosclerotic cerebrovascular disease   Chest pain, atypical   CKD (chronic kidney disease) stage 3, GFR 30-59 ml/min (CMS-HCC)    Encounter for preventive health examination   History of ankle fracture   Sacroiliitis, not elsewhere classified ()   Statin intolerance   Vaginitis and vulvovaginitis   Age-related osteoporosis with current pathological fracture   Rupture of tendon of biceps, long head   Baker's cyst of knee, right    Outpatient Medications Prior to Visit  Medication Sig Dispense Refill   amLODIPine  (NORVASC ) 5 MG tablet      CALCIUM CARBONATE/VITAMIN D3 (CALCIUM 600 + D,3, ORAL) Take by mouth once daily.     clopidogreL  (PLAVIX ) 75 mg tablet Take 1 tablet by mouth once daily     FUROsemide  (LASIX ) 20 MG tablet Take 20 mg by mouth as directed     latanoprost (XALATAN) 0.005 % ophthalmic solution INSTILL 1 DROP INTO EACH EYE ONCE DAILY AT NIGHT     lidocaine  (SALONPAS) 4 % patch Place 1 patch onto the skin daily     multivitamin tablet Take 1 tablet by mouth once daily.     omega-3-dha-epa-fish oil 300-1,000 mg capsule Take 1 g by mouth once daily     pravastatin  (PRAVACHOL ) 20 MG tablet Take 20 mg by mouth nightly.     romosozumab -aqqg (EVENITY ) 105 mg/1.17 mL subcutaneous injection Inject 210 mg subcutaneously monthly     telmisartan  (MICARDIS ) 80 MG tablet Take 1 tablet by mouth at bedtime     zoster vaccine live, PF, (ZOSTAVAX) injection      HYDROcodone-acetaminophen  (NORCO) 5-325 mg tablet Take 1 tablet by mouth every 8 (eight) hours as needed for Pain     omega-3 fatty acids/fish oil 340-1,000 mg capsule Take 1 capsule by  mouth once daily. (Patient not taking: Reported on 06/23/2024)     No facility-administered medications prior to visit.      Objective  Vitals:   06/23/24 1302  BP: 138/70  Weight: 58.5 kg (129 lb)  Height: 157.5 cm (5' 2)  PainSc:   7  PainLoc: Knee   Body mass index is 23.59 kg/m.  Home Vitals:     Physical Exam Physical Exam GENERAL: Alert, cooperative, well developed, no acute distress. HEENT: Normocephalic, normal oropharynx, moist  mucous membranes. CHEST: Clear to auscultation bilaterally, no wheezes, rhonchi, or crackles. CARDIOVASCULAR: Normal heart rate and rhythm, S1 and S2 normal without murmurs. ABDOMEN: Soft, non-tender, non-distended, without organomegaly, normal bowel sounds. EXTREMITIES: No cyanosis or edema. MUSCULOSKELETAL: Anterior medial knee swelling and tenderness. Hip normal. NEUROLOGICAL: Cranial nerves grossly intact, moves all extremities without gross motor or sensory deficit.   Results Radiology Right knee X-ray (05/2024): Mild osteoarthritis (Independently interpreted) Back X-ray (2018): Spondylolisthesis (Independently interpreted) Bone scan (04/2023): Compression fracture (Independently interpreted)     Assessment/Plan:   Assessment & Plan Right knee pain with osteoarthritis and possible stress fracture She experiences persistent, severe right knee pain with swelling and tenderness in the anterior medial knee, unresponsive to intraarticular corticosteroid injections and Baker cyst aspiration. Radiographs show mild osteoarthritis, insufficient to explain the severity of symptoms. The lack of response to anti-inflammatory therapy and severe osteoporosis suggest an occult stress fracture not visible on plain films. An MRI is ordered to evaluate for an occult fracture. If a stress fracture is found, options include bone putty injection or bone stimulator. If only osteoarthritis is present, further surgical intervention is unlikely to help. Due to her advanced age, there is a need for cautious use of analgesics to avoid sedation and confusion, which may increase fall risk. Acetaminophen  dosing limits were reviewed. Hydrocodone-acetaminophen  is prescribed for pain, starting with half a tablet and using up to one tablet four times daily as needed, not exceeding recommended acetaminophen  limits. She is advised to monitor for confusion or sedation and to discontinue use if these occur. She should notify the  office upon MRI completion for prompt review of results.  Chronic ulcer of right foot, limited to skin breakdown She has a chronic ulceration with ongoing skin breakdown of the right toe.  Recording duration: 15 minutes Diagnoses and all orders for this visit:  Stress fracture of right tibia, initial encounter -     MRI knee right without contrast; Future  Ulcer of right foot, limited to breakdown of skin (CMS/HHS-HCC)  Other orders -     HYDROcodone-acetaminophen  (NORCO) 5-325 mg tablet; Take 1 tablet by mouth every 6 (six) hours as needed    This visit was coded based on medical decision making (MDM).           Future Appointments   This patient does not currently have any appointments scheduled.     There are no Patient Instructions on file for this visit.  An after visit summary was provided for the patient either in written format (printed) or through My [facility].  This note has been created using automated tools and reviewed for accuracy by [facility].

## 2024-06-26 NOTE — Telephone Encounter (Signed)
 Patient seen in clinic 06/23/24. All questions and concerns addressed at that time.

## 2024-06-27 NOTE — Telephone Encounter (Signed)
 Pt ready for scheduling for EVENITY  on or after : 06/27/24  Option# 1 Buy/Bill (Office supplied medication)  Out-of-pocket cost due at time of  office visit: $0  Number of injection/visits approved: ---  Primary: MEDICARE Evenity  co-insurance: 0% Admin fee co-insurance: 0%  Secondary: AARP-MEDSUP Evenity  co-insurance: covers the Medicare Part B deductible, co-insurance and 100% of the excess charges Admin fee co-insurance:   Medical Benefit Details: Date Benefits were checked: 06/26/24 Deductible: $0 Met of $283 Required/ Coinsurance: 0%/ Admin Fee: 0%  Prior Auth: N/A PA# Expiration Date:   # of doses approved: ------------------------------------------------------------------------- Option# 2- Med Obtained from pharmacy  Pharmacy benefit: Copay $--- (Paid to pharmacy) Admin Fee: --- (Pay at clinic)  Prior Auth: --- PA# Expiration Date:   # of doses approved:  If patient wants fill through the pharmacy benefit please send prescription to: ---, and include estimated need by date in rx notes. Pharmacy will ship medication directly to the office.  Patient not eligible for Evenity  Copay Card. Copay Card can make patient's cost as little as $25. Link to apply: [email or web address]://[email or web address]   This summary of benefits is an estimation of the patient's out-of-pocket cost. Exact cost may very based on individual plan coverage.

## 2024-06-27 NOTE — Telephone Encounter (Signed)
 [name]

## 2024-07-02 ENCOUNTER — Ambulatory Visit

## 2024-07-02 VITALS — BP 133/66 | Ht 60.0 in | Wt 127.5 lb

## 2024-07-02 DIAGNOSIS — Z0000 Encounter for general adult medical examination without abnormal findings: Secondary | ICD-10-CM | POA: Diagnosis not present

## 2024-07-02 NOTE — Patient Instructions (Signed)
 [name],  Thank you for taking the time for your Medicare Wellness Visit. I appreciate your continued commitment to your health goals. Please review the care plan we discussed, and feel free to reach out if I can assist you further.  Please note that Annual Wellness Visits do not include a physical exam. Some assessments may be limited, especially if the visit was conducted virtually. If needed, we may recommend an in-person follow-up with your provider.  Ongoing Care Seeing your primary care provider every 3 to 6 months helps us  monitor your health and provide consistent, personalized care.  Remember to update your tetanus vaccine at your pharmacy. Managing Pain Without Opioids Opioids are strong medicines used to treat moderate to severe pain. For some people, especially those who have long-term (chronic) pain, opioids may not be the best choice for pain management due to: Side effects like nausea, constipation, and sleepiness. The risk of addiction (opioid use disorder). The longer you take opioids, the greater your risk of addiction. Pain that lasts for more than 3 months is called chronic pain. Managing chronic pain usually requires more than one approach and is often provided by a team of health care providers working together (multidisciplinary approach). Pain management may be done at a pain management center or pain clinic. How to manage pain without the use of opioids Use non-opioid medicines Non-opioid medicines for pain may include: Over-the-counter or prescription non-steroidal anti-inflammatory drugs (NSAIDs). These may be the first medicines used for pain. They work well for muscle and bone pain, and they reduce swelling. Acetaminophen . This over-the-counter medicine may work well for milder pain but not swelling. Antidepressants. These may be used to treat chronic pain. A certain type of antidepressant (tricyclics) is often used. These medicines are given in lower doses for pain  than when used for depression. Anticonvulsants. These are usually used to treat seizures but may also reduce nerve (neuropathic) pain. Muscle relaxants. These relieve pain caused by sudden muscle tightening (spasms). You may also use a pain medicine that is applied to the skin as a patch, cream, or gel (topical analgesic), such as a numbing medicine. These may cause fewer side effects than medicines taken by mouth. Do certain therapies as directed Some therapies can help with pain management. They include: Physical therapy. You will do exercises to gain strength and flexibility. A physical therapist may teach you exercises to move and stretch parts of your body that are weak, stiff, or painful. You can learn these exercises at physical therapy visits and practice them at home. Physical therapy may also involve: Massage. Heat wraps or applying heat or cold to affected areas. Electrical signals that interrupt pain signals (transcutaneous electrical nerve stimulation, TENS). Weak lasers that reduce pain and swelling (low-level laser therapy). Signals from your body that help you learn to regulate pain (biofeedback). Occupational therapy. This helps you to learn ways to function at home and work with less pain. Recreational therapy. This involves trying new activities or hobbies, such as a physical activity or drawing. Mental health therapy, including: Cognitive behavioral therapy (CBT). This helps you learn coping skills for dealing with pain. Acceptance and commitment therapy (ACT) to change the way you think and react to pain. Relaxation therapies, including muscle relaxation exercises and mindfulness-based stress reduction. Pain management counseling. This may be individual, family, or group counseling.  Receive medical treatments Medical treatments for pain management include: Nerve block injections. These may include a pain blocker and anti-inflammatory medicines. You may have  injections:  Near the spine to relieve chronic back or neck pain. Into joints to relieve back or joint pain. Into nerve areas that supply a painful area to relieve body pain. Into muscles (trigger point injections) to relieve some painful muscle conditions. A medical device placed near your spine to help block pain signals and relieve nerve pain or chronic back pain (spinal cord stimulation device). Acupuncture. Follow these instructions at home Medicines Take over-the-counter and prescription medicines only as told by your health care provider. If you are taking pain medicine, ask your health care providers about possible side effects to watch out for. Do not drive or use heavy machinery while taking prescription opioid pain medicine. Lifestyle  Do not use drugs or alcohol to reduce pain. If you drink alcohol, limit how much you have to: 0-1 drink a day for women who are not pregnant. 0-2 drinks a day for men. Know how much alcohol is in a drink. In the [place] one drink equals one 12 oz bottle of beer (355 mL), one 5 oz glass of wine (148 mL), or one 1 oz glass of hard liquor (44 mL). Do not use any products that contain nicotine or tobacco. These products include cigarettes, chewing tobacco, and vaping devices, such as e-cigarettes. If you need help quitting, ask your health care provider. Eat a healthy diet and maintain a healthy weight. Poor diet and excess weight may make pain worse. Eat foods that are high in fiber. These include fresh fruits and vegetables, whole grains, and beans. Limit foods that are high in fat and processed sugars, such as fried and sweet foods. Exercise regularly. Exercise lowers stress and may help relieve pain. Ask your health care provider what activities and exercises are safe for you. If your health care provider approves, join an exercise class that combines movement and stress reduction. Examples include yoga and tai chi. Get enough sleep. Lack of sleep may  make pain worse. Lower stress as much as possible. Practice stress reduction techniques as told by your therapist. General instructions Work with all your pain management providers to find the treatments that work best for you. You are an important member of your pain management team. There are many things you can do to reduce pain on your own. Consider joining an online or in-person support group for people who have chronic pain. Keep all follow-up visits. This is important. Where to find more information You can find more information about managing pain without opioids from: American Academy of Pain Medicine: painmed.org Institute for Chronic Pain: instituteforchronicpain.org American Chronic Pain Association: theacpa.org Contact a health care provider if: You have side effects from pain medicine. Your pain gets worse or does not get better with treatments or home therapy. You are struggling with anxiety or depression. Summary Many types of pain can be managed without opioids. Chronic pain may respond better to pain management without opioids. Pain is best managed when you and a team of health care providers work together. Pain management without opioids may include non-opioid medicines, medical treatments, physical therapy, mental health therapy, and lifestyle changes. Tell your health care providers if your pain gets worse or is not being managed well enough. This information is not intended to replace advice given to you by your health care provider. Make sure you discuss any questions you have with your health care provider. Document Revised: 09/15/2020 Document Reviewed: 09/15/2020 Elsevier Patient Education  2024 Elsevier Inc.  Referrals If a referral was made during today's visit and you  haven't received any updates within two weeks, please contact the referred provider directly to check on the status.  Recommended Screenings:  Health Maintenance  Topic Date Due    DTaP/Tdap/Td vaccine (4 - Td or Tdap) 04/02/2023   Hemoglobin A1C  01/23/2024   COVID-19 Vaccine (8 - 2025-26 season) 02/18/2024   Medicare Annual Wellness Visit  07/02/2025   Pneumococcal Vaccine for age over 15  Completed   Flu Shot  Completed   Zoster (Shingles) Vaccine  Completed   Meningitis B Vaccine  Aged Out   Osteoporosis screening with Bone Density Scan  Discontinued       07/02/2024    9:44 AM  Advanced Directives  Does Patient Have a Medical Advance Directive? Yes  Type of Estate Agent of [place];Living will  Does patient want to make changes to medical advance directive? No - Patient declined  Copy of Healthcare Power of Attorney in Chart? No - copy requested    Vision: Annual vision screenings are recommended for early detection of glaucoma, cataracts, and diabetic retinopathy. These exams can also reveal signs of chronic conditions such as diabetes and high blood pressure.  Dental: Annual dental screenings help detect early signs of oral cancer, gum disease, and other conditions linked to overall health, including heart disease and diabetes.  Please see the attached documents for additional preventive care recommendations.

## 2024-07-02 NOTE — Progress Notes (Signed)
 "  Chief Complaint  Patient presents with   Medicare Wellness     Subjective:   [name] is a 89 y.o. female who presents for a Medicare Annual Wellness Visit.  Visit info / Clinical Intake: Medicare Wellness Visit Type:: Subsequent Annual Wellness Visit Persons participating in visit and providing information:: patient Medicare Wellness Visit Mode:: Telephone If telephone:: video declined Since this visit was completed virtually, some vitals may be partially provided or unavailable. Missing vitals are due to the limitations of the virtual format.: Documented vitals are patient reported If Telephone or Video please confirm:: I connected with patient using audio/video enable telemedicine. I verified patient identity with two identifiers, discussed telehealth limitations, and patient agreed to proceed. Patient Location:: Home Provider Location:: Office/Home Interpreter Needed?: No Pre-visit prep was completed: yes AWV questionnaire completed by patient prior to visit?: no Living arrangements:: (!) lives alone; in retirement community Patient's Overall Health Status Rating: good Typical amount of pain: some (knee pain bakers cyst, seeing doctor) Does pain affect daily life?: no Are you currently prescribed opioids?: (!) yes  Dietary Habits and Nutritional Risks How many meals a day?: 3 Eats fruit and vegetables daily?: yes Most meals are obtained by: having others provide food In the last 2 weeks, have you had any of the following?: none Diabetic:: no  Functional Status Activities of Daily Living (to include ambulation/medication): Independent Ambulation: Independent with device- listed below Home Assistive Devices/Equipment: Walker (specify Type) Medication Administration: Independent Home Management (perform basic housework or laundry): Independent Manage your own finances?: (!) no (daughter handles) Primary transportation is: family / friends Concerns about vision?: no  *vision screening is required for WTM* Concerns about hearing?: no  Fall Screening Falls in the past year?: 0 Number of falls in past year: 0 Was there an injury with Fall?: 0 Fall Risk Category Calculator: 0 Patient Fall Risk Level: Low Fall Risk  Fall Risk Patient at Risk for Falls Due to: No Fall Risks Fall risk Follow up: Falls evaluation completed; Falls prevention discussed  Home and Transportation Safety: All rugs have non-skid backing?: N/A, no rugs All stairs or steps have railings?: N/A, no stairs Grab bars in the bathtub or shower?: yes Have non-skid surface in bathtub or shower?: yes (and a chair) Good home lighting?: yes Regular seat belt use?: yes Hospital stays in the last year:: no  Cognitive Assessment Difficulty concentrating, remembering, or making decisions? : no Will 6CIT or Mini Cog be Completed: yes What year is it?: 0 points What month is it?: 0 points Give patient an address phrase to remember (5 components): [address], [place] About what time is it?: 0 points Count backwards from 20 to 1: 0 points Say the months of the year in reverse: 4 points Repeat the address phrase from earlier: 0 points 6 CIT Score: 4 points  Advance Directives (For Healthcare) Does Patient Have a Medical Advance Directive?: Yes Does patient want to make changes to medical advance directive?: No - Patient declined Type of Advance Directive: Healthcare Power of [place]; Living will Copy of Healthcare Power of Attorney in Chart?: No - copy requested Copy of Living Will in Chart?: No - copy requested  Reviewed/Updated  Reviewed/Updated: Reviewed All (Medical, Surgical, Family, Medications, Allergies, Care Teams, Patient Goals)    Allergies (verified) Contrast media [iodinated contrast media] and Sulfa antibiotics   Current Medications (verified) Outpatient Encounter Medications as of 07/02/2024  Medication Sig   amLODipine  (NORVASC ) 5 MG tablet TAKE ONE (1)  TABLET BY MOUTH TWO TIMES PER DAY   Calcium Carbonate-Vitamin D  600-200 MG-UNIT TABS Take 1 tablet by mouth daily.   clopidogrel  (PLAVIX ) 75 MG tablet Take 1 tablet (75 mg total) by mouth daily.   fish oil-omega-3 fatty acids 1000 MG capsule Take 1 g by mouth daily.   furosemide  (LASIX ) 20 MG tablet Daily as needed   HYDROcodone-acetaminophen  (NORCO/VICODIN) 5-325 MG tablet Take 1 tablet by mouth every 6 (six) hours as needed.   latanoprost (XALATAN) 0.005 % ophthalmic solution Place 1 drop into both eyes at bedtime.   lidocaine  (LIDODERM ) 5 % Place 1 patch onto the skin daily. Remove & Discard patch within 12 hours or as directed by MD   metoprolol  succinate (TOPROL -XL) 50 MG 24 hr tablet TAKE ONE TABLET BY MOUTH ONCE DAILY   Multiple Vitamin (MULTIVITAMIN) tablet Take 1 tablet by mouth daily.     Multiple Vitamins-Minerals (EYE VITAMINS PO) Take 1 tablet by mouth daily.   pravastatin  (PRAVACHOL ) 20 MG tablet TAKE ONE TABLET (20 MG) BY MOUTH EVERY EVENING   telmisartan  (MICARDIS ) 80 MG tablet TAKE ONE TABLET (80 MG TOTAL) BY MOUTH ONCE DAILY   Facility-Administered Encounter Medications as of 07/02/2024  Medication   Romosozumab -aqqg (EVENITY ) 105 MG/1. injection 210 mg   Romosozumab -aqqg (EVENITY ) 105 MG/1. injection 210 mg   Romosozumab -aqqg (EVENITY ) 105 MG/1. injection 210 mg   Romosozumab -aqqg (EVENITY ) 105 MG/1. injection 210 mg    History: Past Medical History:  Diagnosis Date   Aortic insufficiency    a. 04/2014 Echo: mild-mod AI; b. 08/2023 Echo: Mod AI (mean grad 9.0 mmHg).   Carotid artery disease    a. 11/2018 Carotid U/S: 1-49% bilat ICA stenosis.   Coronary artery disease, non-occlusive    a. 08/2012 Ex MV: EF 51%, no ischemia; b. 10/2012 Cath (performed in setting of chest pain): LM nl, LAD 20-30p, D1/2 nl, LCX 20-25%p, OM1 small, RCA large - nl. RPDA/RPLV nl-->med rx. EF 55-60%.   Diastolic dysfunction    a. 08/2023 Echo: GrI DD.   Hyperlipidemia     Hypertension    Left bundle branch block    Nonischemic cardiomyopathy (HCC)    a. 04/2014 Echo: EF 45-50%, mild-mod AI, mild MR, mild-mod TR; b. 08/2023 Echo: EF 50-55%, no rwma, mild LVH, GrI DD, nl RV fxn, mild MR, mod AI.   PSVT (paroxysmal supraventricular tachycardia)    a. 12/2018 Zio: 47 SVT runs (fastest 176, longests 17 beats).   TIA (transient ischemic attack) 11/2018   a. 12/2018 Zio: predominantly sinus rhyth, avg 66 (45-176), 47 SVT runs (longest 17 beats, fastest 176). Rare PVCs. No Afib.   Past Surgical History:  Procedure Laterality Date   ABDOMINAL HYSTERECTOMY     ankle     rods in both ankles   BREAST BIOPSY Bilateral YRS AGO   NEG   CARDIAC CATHETERIZATION     [facility]   left ankle  Octo 2012   s/p pinning , Krazinski   LEFT HEART CATHETERIZATION WITH CORONARY ANGIOGRAM N/A 10/31/2012   Procedure: LEFT HEART CATHETERIZATION WITH CORONARY ANGIOGRAM;  Surgeon: Rober LOISE Chroman, MD;  Location: [facility]ATH [facility];  Service: Cardiovascular;  Laterality: N/A;   Family History  Problem Relation Age of Onset   Hypertension Mother    Heart attack Mother    Hypertension Father    Cancer Father        stomach   Heart attack Brother    Stroke Sister    Breast cancer Neg Hx  Social History   Occupational History   Occupation: retired  Tobacco Use   Smoking status: Never   Smokeless tobacco: Never  Vaping Use   Vaping status: Never Used  Substance and Sexual Activity   Alcohol use: No   Drug use: No   Sexual activity: Not Currently   Tobacco Counseling Counseling given: Not Answered  SDOH Screenings   Food Insecurity: No Food Insecurity (07/02/2024)  Housing: Low Risk (07/02/2024)  Transportation Needs: No Transportation Needs (07/02/2024)  Utilities: Not At Risk (07/02/2024)  Alcohol Screen: Low Risk (04/18/2023)  Depression (PHQ2-9): Low Risk (07/02/2024)  Financial Resource Strain: Low Risk (07/02/2024)  Physical Activity: Insufficiently Active (07/02/2024)  Social  Connections: Moderately Isolated (07/02/2024)  Stress: No Stress Concern Present (07/02/2024)  Tobacco Use: Low Risk (07/02/2024)  Health Literacy: Adequate Health Literacy (07/02/2024)   See flowsheets for full screening details  Depression Screen PHQ 2 & 9 Depression Scale- Over the past 2 weeks, how often have you been bothered by any of the following problems? Little interest or pleasure in doing things: 0 Feeling down, depressed, or hopeless (PHQ Adolescent also includes...irritable): 0 PHQ-2 Total Score: 0 Trouble falling or staying asleep, or sleeping too much: 0 Feeling tired or having little energy: 0 Poor appetite or overeating (PHQ Adolescent also includes...weight loss): 0 Feeling bad about yourself - or that you are a failure or have let yourself or your family down: 0 Trouble concentrating on things, such as reading the newspaper or watching television (PHQ Adolescent also includes...like school work): 0 Moving or speaking so slowly that other people could have noticed. Or the opposite - being so fidgety or restless that you have been moving around a lot more than usual: 0 Thoughts that you would be better off dead, or of hurting yourself in some way: 0 PHQ-9 Total Score: 0 If you checked off any problems, how difficult have these problems made it for you to do your work, take care of things at home, or get along with other people?: Not difficult at all     Goals Addressed             This Visit's Progress    Patient Stated       Wants to be able to walk more             Objective:    Today's Vitals   07/02/24 0938 07/02/24 1001  BP: (!) 140/64 133/66  Weight: 127 lb 8 oz (57.8 kg)   Height: 5' (1.524 m)    Body mass index is 24.9 kg/m.  Hearing/Vision screen Hearing Screening - Comments:: No issues Vision Screening - Comments:: Glasses, [facility], up to date Immunizations and Health Maintenance Health Maintenance  Topic Date Due   DTaP/Tdap/Td (4  - Td or Tdap) 04/02/2023   HEMOGLOBIN A1C  01/23/2024   COVID-19 Vaccine (8 - 2025-26 season) 02/18/2024   Medicare Annual Wellness (AWV)  07/02/2025   Pneumococcal Vaccine: 50+ Years  Completed   Influenza Vaccine  Completed   Zoster Vaccines- Shingrix  Completed   Meningococcal B Vaccine  Aged Out   Bone Density Scan  Discontinued        Assessment/Plan:  This is a routine wellness examination for Zaiya.  Patient Care Team: [name], MD as PCP - General (Internal Medicine) Darron Deatrice LABOR, MD as PCP - Cardiology (Cardiology) Dingeldein, Elspeth, MD (Ophthalmology) Ashley Soulier, DPM as Referring Physician (Podiatry) Kathlynn Sharper, MD as Consulting Physician (Orthopedic Surgery)  I have personally reviewed and noted the following in the patients chart:   Medical and social history Use of alcohol, tobacco or illicit drugs  Current medications and supplements including opioid prescriptions. Functional ability and status Nutritional status Physical activity Advanced directives List of other physicians Hospitalizations, surgeries, and ER visits in previous 12 months Vitals Screenings to include cognitive, depression, and falls Referrals and appointments  No orders of the defined types were placed in this encounter.  In addition, I have reviewed and discussed with patient certain preventive protocols, quality metrics, and best practice recommendations. A written personalized care plan for preventive services as well as general preventive health recommendations were provided to patient.   Angeline Fredericks, LPN   [id number]/85/[id number]   Return in 1 year (on 07/02/2025).  After Visit Summary: (Pick Up) Due to this being a telephonic visit, with patients personalized plan was offered to patient and patient has requested to Pick up at office.  Nurse Notes: Patient declines covid vaccine. Discussed the need to update tetanus vaccine.  "

## 2024-07-04 ENCOUNTER — Ambulatory Visit

## 2024-07-04 DIAGNOSIS — M810 Age-related osteoporosis without current pathological fracture: Secondary | ICD-10-CM

## 2024-07-04 MED ORDER — ROMOSOZUMAB-AQQG 105 MG/1.17ML ~~LOC~~ SOSY: 210.0000 mg | PREFILLED_SYRINGE | Freq: Once | SUBCUTANEOUS | Status: AC

## 2024-07-04 MED ORDER — ROMOSOZUMAB-AQQG 105 MG/1.17ML ~~LOC~~ SOSY: 210.0000 mg | PREFILLED_SYRINGE | Freq: Once | SUBCUTANEOUS | Status: DC

## 2024-07-04 NOTE — Progress Notes (Signed)
 Patient received Evenity  injection in R and L arm subcutaneously. Patient tolerated injections well.

## 2024-07-10 ENCOUNTER — Other Ambulatory Visit: Payer: Self-pay | Admitting: Internal Medicine

## 2024-08-05 ENCOUNTER — Ambulatory Visit

## 2024-08-21 ENCOUNTER — Ambulatory Visit: Admitting: Physician Assistant

## 2024-08-27 ENCOUNTER — Ambulatory Visit: Admitting: Cardiovascular Disease

## 2024-11-25 ENCOUNTER — Ambulatory Visit: Admitting: Internal Medicine

## 2025-07-06 ENCOUNTER — Ambulatory Visit
